# Patient Record
Sex: Male | Born: 1955 | Race: White | Hispanic: Refuse to answer | Marital: Married | Smoking: Never smoker
Health system: Southern US, Community
[De-identification: ages and names within clinical notes are randomized; demographics above are authoritative.]

## PROBLEM LIST (undated history)

## (undated) DIAGNOSIS — E785 Hyperlipidemia, unspecified: Secondary | ICD-10-CM

## (undated) DIAGNOSIS — E119 Type 2 diabetes mellitus without complications: Secondary | ICD-10-CM

## (undated) DIAGNOSIS — I2510 Atherosclerotic heart disease of native coronary artery without angina pectoris: Secondary | ICD-10-CM

## (undated) DIAGNOSIS — I219 Acute myocardial infarction, unspecified: Secondary | ICD-10-CM

## (undated) HISTORY — DX: Acute myocardial infarction, unspecified: I21.9

## (undated) HISTORY — PX: CHOLECYSTECTOMY: SHX55

## (undated) HISTORY — DX: Atherosclerotic heart disease of native coronary artery without angina pectoris: I25.10

## (undated) HISTORY — PX: CARDIAC CATHETERIZATION: SHX172

## (undated) HISTORY — PX: HERNIA REPAIR: SHX51

## (undated) HISTORY — DX: Type 2 diabetes mellitus without complications: E11.9

## (undated) HISTORY — PX: MELANOMA EXCISION: SHX5266

---

## 2010-02-05 ENCOUNTER — Encounter: Payer: Self-pay | Admitting: Gastroenterology

## 2010-08-20 NOTE — Letter (Signed)
Summary: Previsit letter  [facility]  [address] [place], [place] [address]   Phone: [phone]  Fax: [phone]       02/05/2010 MRN: [id number]  [facility] [address] [place], [place]  [address]  Dear Mr. LEDO,  Welcome to the Gastroenterology Division at [facility].    You are scheduled to see a nurse for your pre-procedure visit on  02-27-10 at 2:30pm on the 3rd floor at [facility], [address]  We ask that you try to arrive at our office 15 minutes prior to your appointment time to allow for check-in.  Your nurse visit will consist of discussing your medical and surgical history, your immediate family medical history, and your medications.    Please bring a complete list of all your medications or, if you prefer, bring the medication bottles and we will list them.  We will need to be aware of both prescribed and over the counter drugs.  We will need to know exact dosage information as well.  If you are on blood thinners (Coumadin, Plavix, Aggrenox, Ticlid, etc.) please call our office today/prior to your appointment, as we need to consult with your physician about holding your medication.   Please be prepared to read and sign documents such as consent forms, a financial agreement, and acknowledgement forms.  If necessary, and with your consent, a friend or relative is welcome to sit-in on the nurse visit with you.  Please bring your insurance card so that we may make a copy of it.  If your insurance requires a referral to see a specialist, please bring your referral form from your primary care physician.  No co-pay is required for this nurse visit.     If you cannot keep your appointment, please call [phone] to cancel or reschedule prior to your appointment date.  This allows [place] the opportunity to schedule an appointment for another patient in need of care.    Thank you for choosing [facility] for your medical  needs.  We appreciate the opportunity to care for you.  Please visit [place] at our website  to learn more about our practice.                     Sincerely.                                                                                                                   The Gastroenterology Division

## 2011-09-19 ENCOUNTER — Emergency Department (HOSPITAL_COMMUNITY): Payer: BC Managed Care – PPO

## 2011-09-19 ENCOUNTER — Other Ambulatory Visit: Payer: Self-pay

## 2011-09-19 ENCOUNTER — Encounter (HOSPITAL_COMMUNITY): Payer: Self-pay | Admitting: Physical Medicine and Rehabilitation

## 2011-09-19 ENCOUNTER — Emergency Department (HOSPITAL_COMMUNITY)
Admission: EM | Admit: 2011-09-19 | Discharge: 2011-09-19 | Disposition: A | Discharge status: Home / Self Care | Payer: BC Managed Care – PPO | Attending: Emergency Medicine | Admitting: Emergency Medicine

## 2011-09-19 DIAGNOSIS — Z7982 Long term (current) use of aspirin: Secondary | ICD-10-CM | POA: Insufficient documentation

## 2011-09-19 DIAGNOSIS — Y9352 Activity, horseback riding: Secondary | ICD-10-CM | POA: Insufficient documentation

## 2011-09-19 DIAGNOSIS — M79609 Pain in unspecified limb: Secondary | ICD-10-CM | POA: Insufficient documentation

## 2011-09-19 DIAGNOSIS — S060XAA Concussion with loss of consciousness status unknown, initial encounter: Secondary | ICD-10-CM | POA: Insufficient documentation

## 2011-09-19 DIAGNOSIS — M25519 Pain in unspecified shoulder: Secondary | ICD-10-CM

## 2011-09-19 DIAGNOSIS — IMO0002 Reserved for concepts with insufficient information to code with codable children: Secondary | ICD-10-CM | POA: Insufficient documentation

## 2011-09-19 DIAGNOSIS — S060X9A Concussion with loss of consciousness of unspecified duration, initial encounter: Secondary | ICD-10-CM | POA: Insufficient documentation

## 2011-09-19 DIAGNOSIS — R079 Chest pain, unspecified: Secondary | ICD-10-CM | POA: Insufficient documentation

## 2011-09-19 DIAGNOSIS — M542 Cervicalgia: Secondary | ICD-10-CM | POA: Insufficient documentation

## 2011-09-19 LAB — DIFFERENTIAL
Eosinophils Absolute: 0 10*3/uL (ref 0.0–0.7)
Eosinophils Relative: 0 % (ref 0–5)
Lymphocytes Relative: 7 % — ABNORMAL LOW (ref 12–46)
Lymphs Abs: 1.1 10*3/uL (ref 0.7–4.0)
Monocytes Relative: 5 % (ref 3–12)
Neutrophils Relative %: 88 % — ABNORMAL HIGH (ref 43–77)

## 2011-09-19 LAB — CBC
Hemoglobin: 15.7 g/dL (ref 13.0–17.0)
MCH: 29.1 pg (ref 26.0–34.0)
MCV: 82.2 fL (ref 78.0–100.0)
Platelets: 207 10*3/uL (ref 150–400)
RBC: 5.39 MIL/uL (ref 4.22–5.81)
WBC: 16.9 10*3/uL — ABNORMAL HIGH (ref 4.0–10.5)

## 2011-09-19 LAB — COMPREHENSIVE METABOLIC PANEL
ALT: 36 U/L (ref 0–53)
Alkaline Phosphatase: 58 U/L (ref 39–117)
BUN: 19 mg/dL (ref 6–23)
CO2: 22 mEq/L (ref 19–32)
GFR calc Af Amer: >90 mL/min (ref >90)
GFR calc non Af Amer: 79 mL/min — ABNORMAL LOW (ref >90)
Glucose, Bld: 111 mg/dL — ABNORMAL HIGH (ref 70–99)
Potassium: 4.5 mEq/L (ref 3.5–5.1)
Sodium: 137 mEq/L (ref 135–145)

## 2011-09-19 MED ORDER — ONDANSETRON 4 MG PO TBDP
ORAL_TABLET | ORAL | Status: AC
  Administered 2011-09-19: 8 mg
  Filled 2011-09-19: qty 2

## 2011-09-19 MED ORDER — OXYCODONE-ACETAMINOPHEN 5-325 MG PO TABS
ORAL_TABLET | ORAL | Status: AC
  Filled 2011-09-19: qty 1

## 2011-09-19 MED ORDER — OXYCODONE-ACETAMINOPHEN 5-325 MG PO TABS
2.0000 | ORAL_TABLET | Freq: Once | ORAL | Status: AC
  Administered 2011-09-19: 2 via ORAL
  Filled 2011-09-19: qty 1

## 2011-09-19 MED ORDER — ONDANSETRON 4 MG PO TBDP: 4.0000 mg | ORAL_TABLET | Freq: Once | ORAL | Status: DC

## 2011-09-19 MED ORDER — SODIUM CHLORIDE 0.9 % IV BOLUS (SEPSIS)
1000.0000 mL | Freq: Once | INTRAVENOUS | Status: AC
  Administered 2011-09-19: 1000 mL via INTRAVENOUS

## 2011-09-19 MED ORDER — HYDROCODONE-ACETAMINOPHEN 5-325 MG PO TABS: 2.0000 | ORAL_TABLET | Freq: Four times a day (QID) | ORAL | Status: AC | PRN

## 2011-09-19 NOTE — ED Notes (Signed)
Pt presents to department via [facility] for evaluation of fall off of horse. Pt unable to rememver exact events, upon arrival GCS of 15. C/o L shoulder pain, no deformity noted. LSB and c-collar per EMS. Received fentanyl per EMS.

## 2011-09-19 NOTE — Discharge Instructions (Signed)
Please make sure to return to the emergency department for any concerning changes your condition.  Specifically, any behavioral changes, any chest pain or difficulty breathing.

## 2011-09-19 NOTE — ED Provider Notes (Signed)
History     CSN: [id number]  Arrival date & time 09/19/11  1139   First MD Initiated Contact with Patient 09/19/11 1156      Chief Complaint  Patient presents with  . Fall     HPI The patient presents as a level II trauma following a probable fall from a horse.  The patient is amnestic to the events prior to EMS arrival.  Per report the patient was seen riding his horse, then was found on the ground with a cracked helmet.  Per EMS, upon their arrival the patient was unsteady, but ambulatory, speaking spontaneously.  The patient notes pain in his left shoulder, right hamstring.  The pain is present since he awakened.  No attempts at analgesia thus far.  He denies any confusion, disorientation, nausea.  He also denies any loss of bowel or bladder control.  He denies any distal dysesthesia in his extremities. No past medical history on file.  No past surgical history on file.  History reviewed. No pertinent family history.  History  Substance Use Topics  . Smoking status: Not on file  . Smokeless tobacco: Not on file  . Alcohol Use: No      Review of Systems  Constitutional:       Per HPI, otherwise negative  HENT:       Per HPI, otherwise negative  Eyes: Negative.   Respiratory:       Per HPI, otherwise negative  Cardiovascular:       Per HPI, otherwise negative  Gastrointestinal: Negative for vomiting.  Genitourinary: Negative.   Musculoskeletal:       Per HPI, otherwise negative  Skin: Negative.   Neurological: Negative for syncope.    Allergies  Penicillins  Home Medications   Current Outpatient Rx  Name Route Sig Dispense Refill  . ASPIRIN EC 81 MG PO TBEC Oral Take 81 mg by mouth daily.    . ADULT MULTIVITAMIN W/MINERALS CH Oral Take 1 tablet by mouth daily.      BP 121/74  Pulse 87  Temp(Src) 98 F (36.7 C) (Oral)  Resp 18  SpO2 99%  Physical Exam  Nursing note and vitals reviewed. Constitutional: He appears distressed.       No apparent  distress, male presenting boarded and collared via EMS.  HENT:  Head: Normocephalic. Head is with abrasion. Head is without raccoon's eyes, without Battle's sign, without contusion, without laceration, without right periorbital erythema and without left periorbital erythema.  Mouth/Throat: Oropharynx is clear and moist and mucous membranes are normal. Normal dentition.       Superficial abrasion on the patient's left posterior ear, approximately 1 cm.  Eyes: Conjunctivae and EOM are normal. Pupils are equal, round, and reactive to light. Right eye exhibits no discharge. Left eye exhibits no discharge.  Neck: Normal range of motion. Muscular tenderness present. No spinous process tenderness present. No rigidity. No edema present.  Cardiovascular: Normal rate and regular rhythm.   Pulmonary/Chest: Effort normal and breath sounds normal. No stridor. No respiratory distress. He has no wheezes. He has no rales. He exhibits tenderness.  Abdominal: Soft. He exhibits no distension.  Musculoskeletal:       Right knee: Normal.       Right ankle: Normal.       Left ankle: Normal.       Arms:      Legs:      Hips stable    ED Course  Procedures (including critical care time)  Labs Reviewed  CBC - Abnormal; Notable for the following:    WBC 16.9 (*)    All other components within normal limits  DIFFERENTIAL - Abnormal; Notable for the following:    Neutrophils Relative 88 (*)    Neutro Abs 14.9 (*)    Lymphocytes Relative 7 (*)    All other components within normal limits  COMPREHENSIVE METABOLIC PANEL   No results found.   No diagnosis found.  Cardiac monitor 90 sinus rhythm normal Pulse oximetry 100% room air normal I have reviewed both the CT and x-rays  Date: 09/19/2011  Rate: 83  Rhythm: normal sinus rhythm  QRS Axis: normal  Intervals: normal  ST/T Wave abnormalities: normal  Conduction Disutrbances:none  Narrative Interpretation:   Old EKG Reviewed: none available NORMAL  ECG   MDM  This generally well male presents following a fall from a horse.  On initial exam the patient is boarded and collared.  The patient's cervical spine was cleared clinically.  Throughout his ED stay the patient had no significant changes in his cognition.  The patient's initial exam is notable for mild shoulder tenderness on the left.  This was evaluated with x-ray which is negative.  The patient's head CT was also unremarkable.  The patient's remaining renographic studies were reassuring as well.  Patient noted significant improvement in his pain when was here, though upon receiving pain medication just prior to discharge, he was nauseous.  This was observed and when less nauseous was walking without difficulty.  He was discharged in stable condition.        Gerhard Munch, MD 09/19/11 [phone]

## 2011-09-19 NOTE — ED Notes (Signed)
Pt states he feels much better. Able to ambulate around department without becoming dizzy or nauseated. To be discharged home, family at bedside. Vital signs stable.

## 2011-09-19 NOTE — ED Notes (Signed)
Pt presents to department for evaluation of fall off horse. Pt states he remembers getting on horse this morning, then states he woke up in a field. States "I can't remember everything that happened." upon arrival GCS of 15, no obvious deformities noted. 8/10 L shoulder pain. Pt is conscious alert and oriented x4 at the time, answering questions appropriately, no neurological deficits noted. Vital signs stable at the present. No signs of acute distress. Family at the bedside at the time.

## 2011-09-19 NOTE — ED Notes (Signed)
Pt resting quietly at the time. States 5/10 L shoulder pain. Vital signs stable. Wife at the bedside. Pt remains alert and oriented x4, answering questions appropriately, but wife states he is "repeating" things intermittently.

## 2011-09-19 NOTE — ED Notes (Signed)
Pt attempted to get dressed for discharge, became nauseated and weak. EDP notified. Pt medicated, will continue to monitor. Vital signs stable at the time.

## 2011-09-19 NOTE — Progress Notes (Signed)
Chaplain responded to a trauma page. Chaplain provided support to patient's wife. Patient's wife stated that she is coping okay. Chaplain no longer needed at this time. Chaplain let patient's wife know that he could be paged by medical staff if needed further.

## 2012-07-06 ENCOUNTER — Other Ambulatory Visit (HOSPITAL_COMMUNITY): Payer: Self-pay | Admitting: Cardiovascular Disease

## 2012-07-06 DIAGNOSIS — I6529 Occlusion and stenosis of unspecified carotid artery: Secondary | ICD-10-CM

## 2012-09-09 IMAGING — CR DG SHOULDER 2+V*L*
3 series · 3 of 3 positions shown · non-contrast
Comparison: None.

CLINICAL DATA: Fall off horse.  Left shoulder injury and pain.

LEFT SHOULDER - 2+ VIEW

[t shoulder ap internal left]
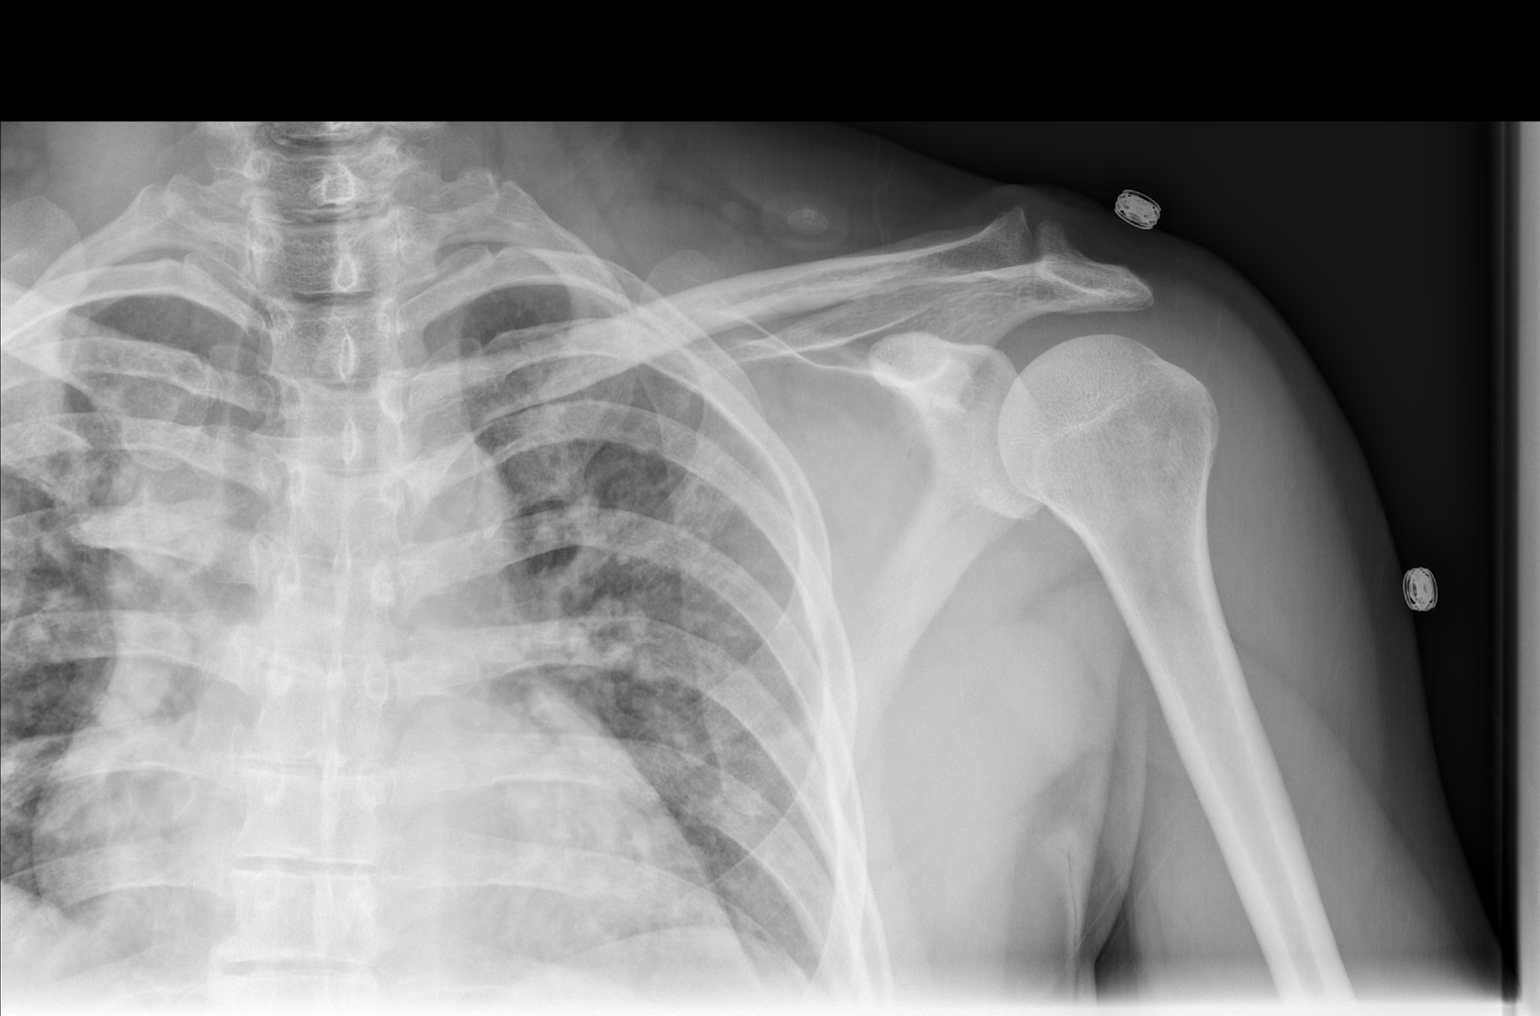

[t shoulder ap external left]
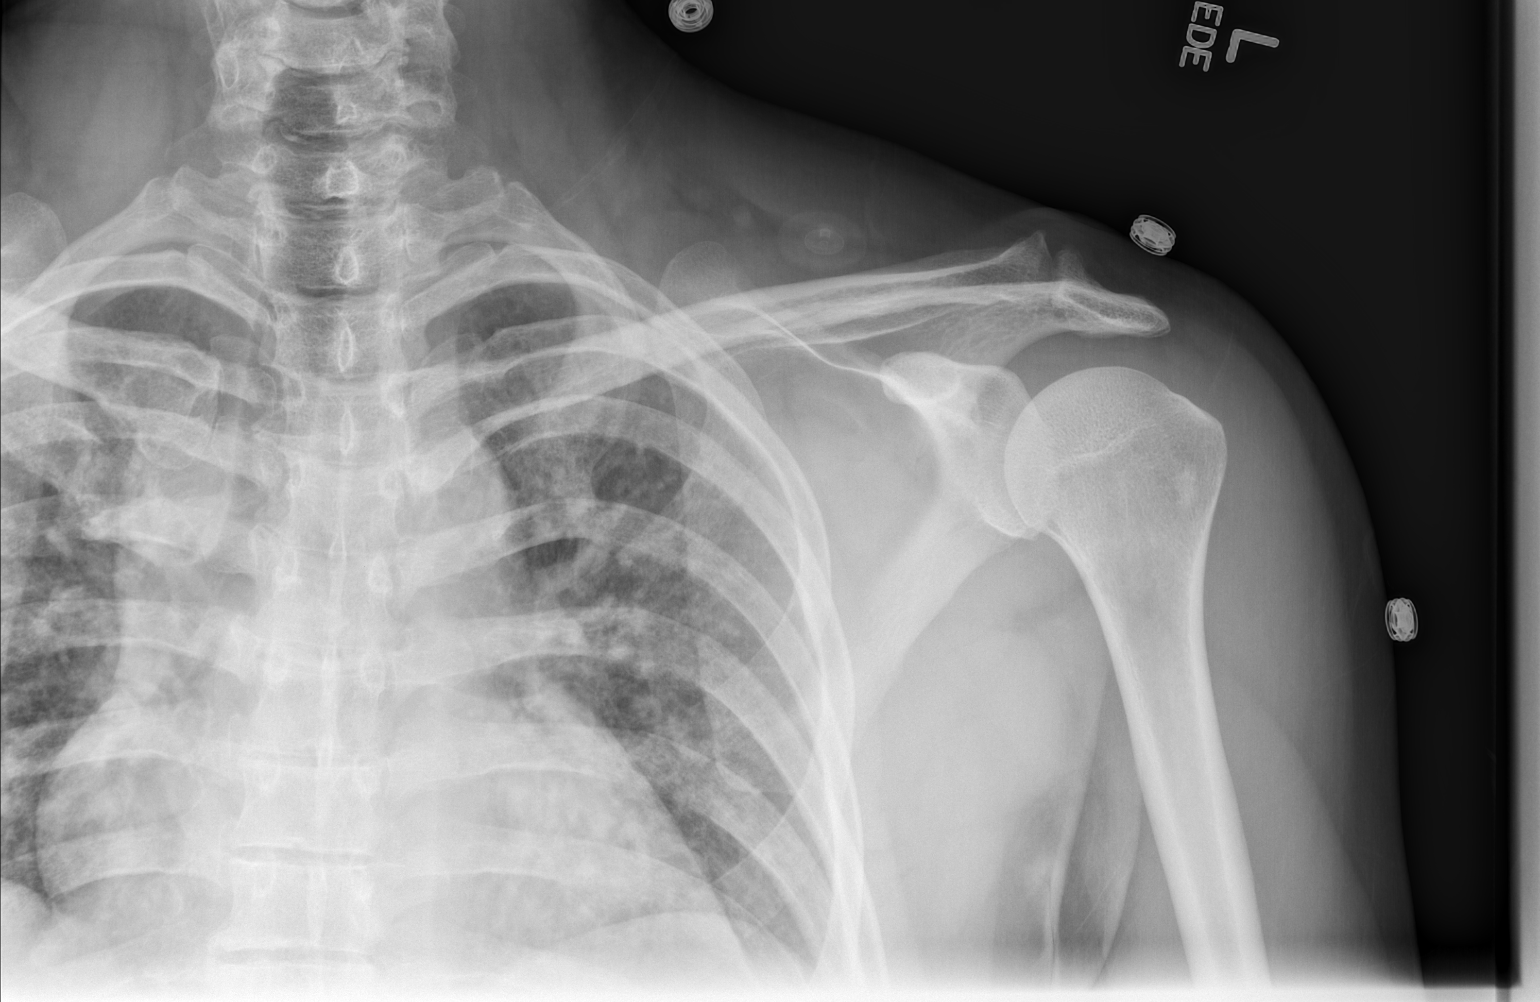

[t shoulder y view left]
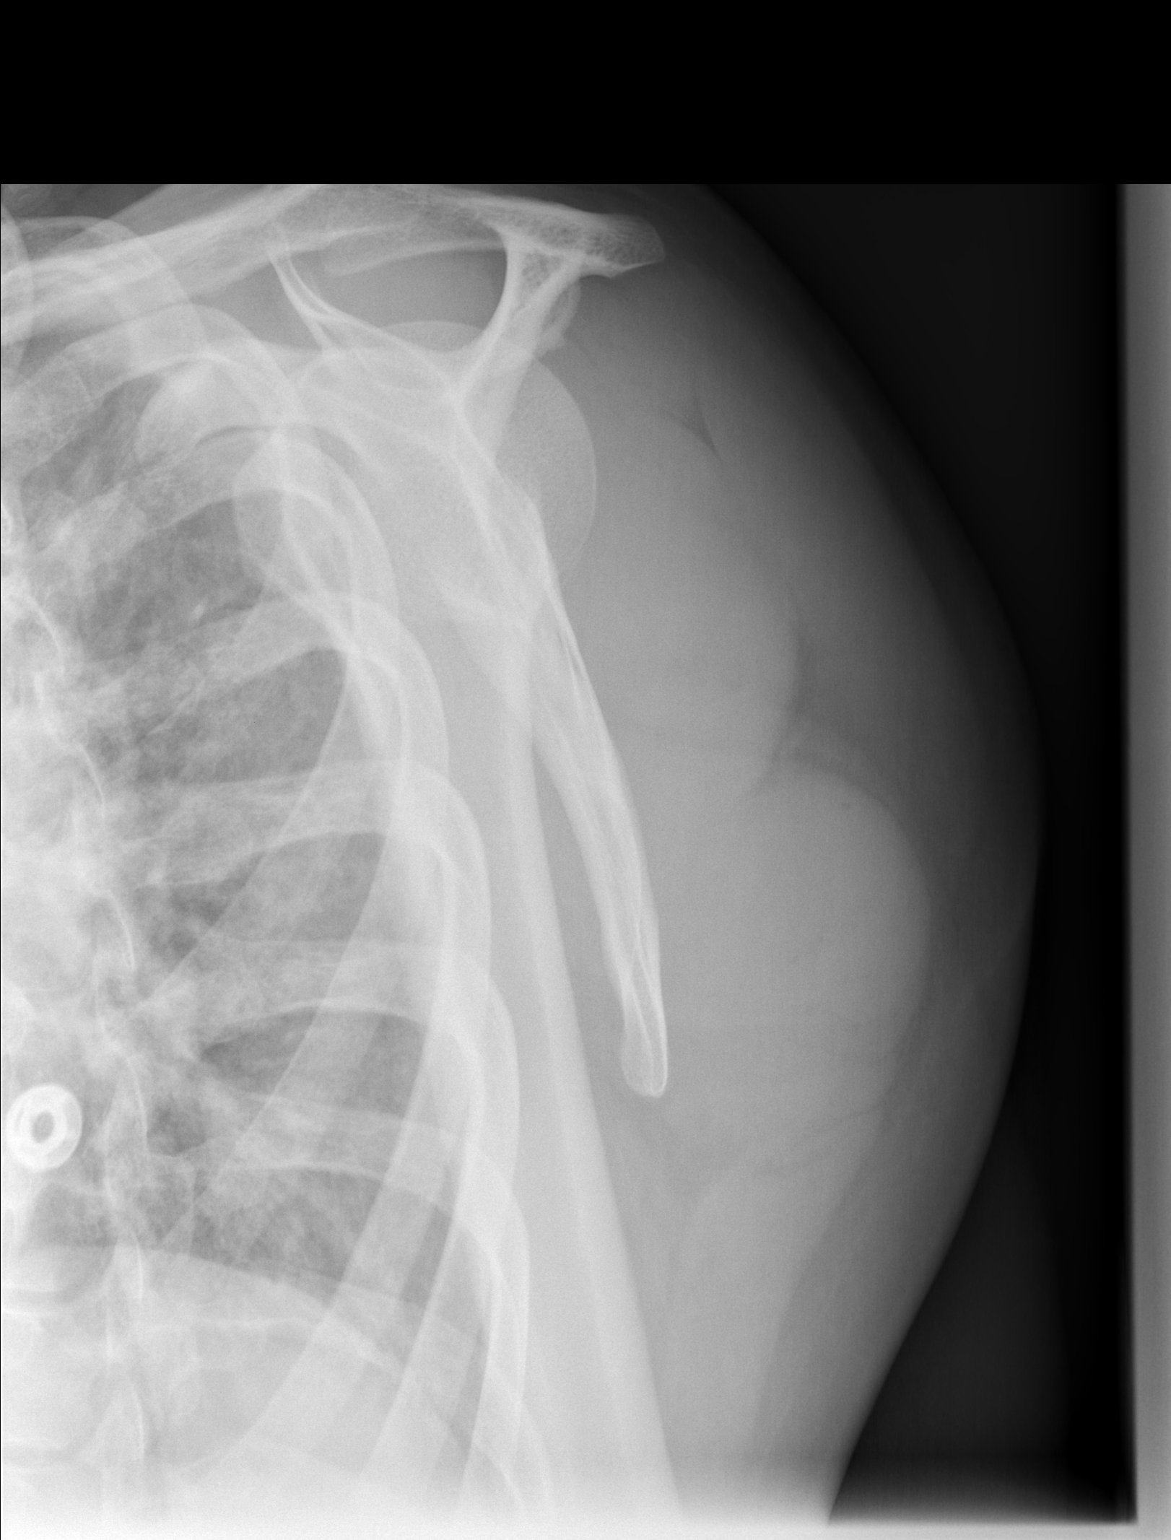

[3 of 3 positions shown; findings below may reference images not displayed]

FINDINGS: No evidence of fracture or dislocation. Mild degenerative
spurring is seen involving the superior aspect of the AC joint.  No
other bone abnormality identified.
IMPRESSION: No acute findings.

## 2012-09-09 IMAGING — CR DG PELVIS 1-2V
1 series · 1 of 1 positions shown · non-contrast
Comparison: None.

CLINICAL DATA: Fall off horse.  Pelvic injury and pain.

PELVIS - 1-2 VIEW

[t pelvis a.p.]
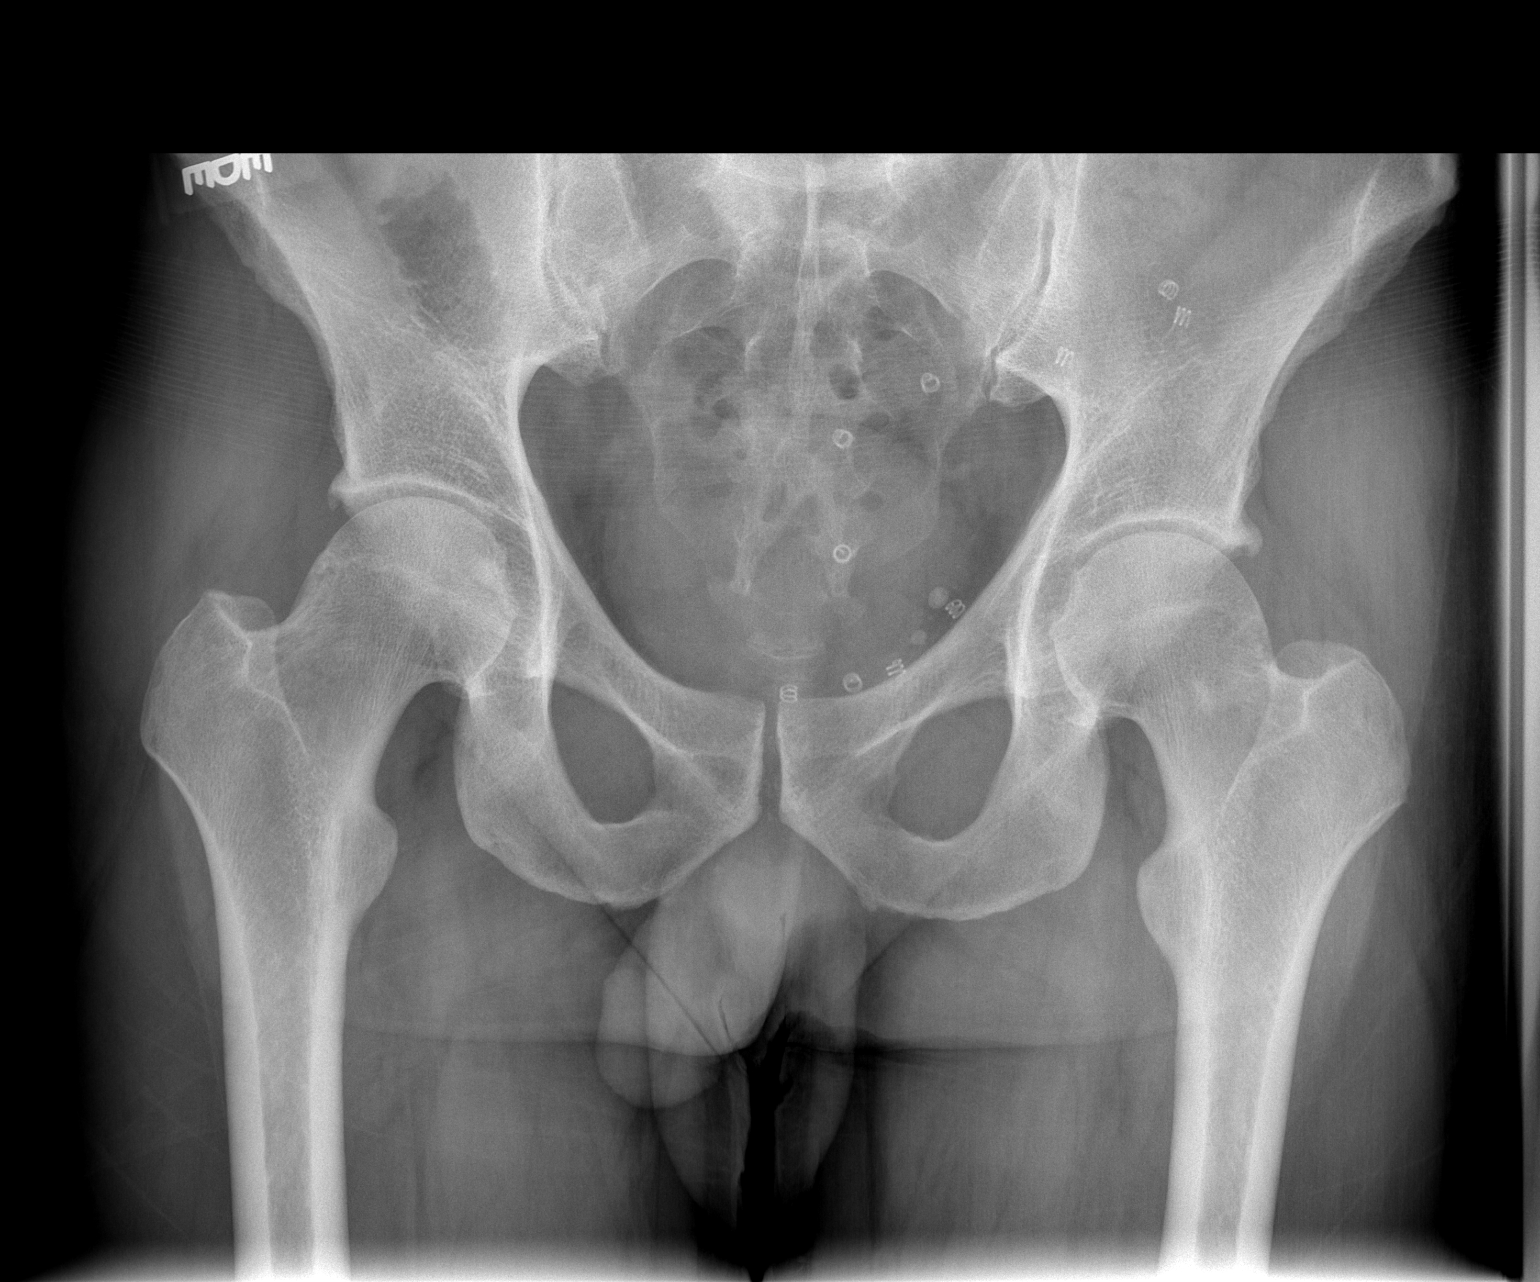

[1 of 1 positions shown; findings below may reference images not displayed]

FINDINGS: No evidence of pelvic fracture or pelvic joint diastasis.
No other pelvic bone abnormality identified.  Surgical mesh is seen
in the left inguinal region.
IMPRESSION: No evidence of pelvic fracture.

## 2012-09-09 IMAGING — CT CT HEAD W/O CM
1 series · 16 of 30 positions shown, 20 images · non-contrast
Comparison: None.

CLINICAL DATA: Fall from horse

CT HEAD WITHOUT CONTRAST
TECHNIQUE: Contiguous axial images were obtained from the base of
the skull through the vertex without contrast.

[Series 2: head trauma 4.8 h37s · axial · 0.51mm/px · z∈[-128,+32]mm · 16 of 36 slices shown, 20 images]
[im 2/36  brain]
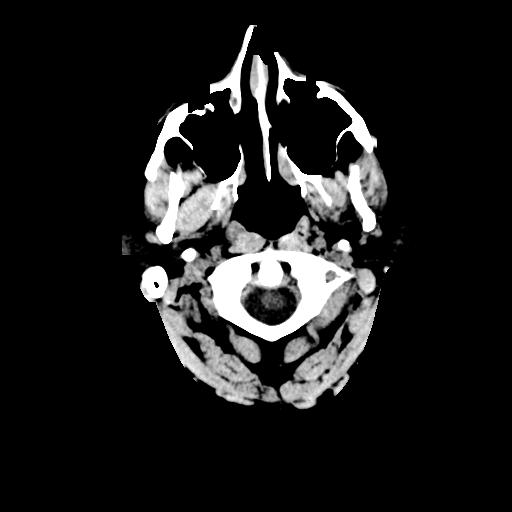
[im 2/36  bone]
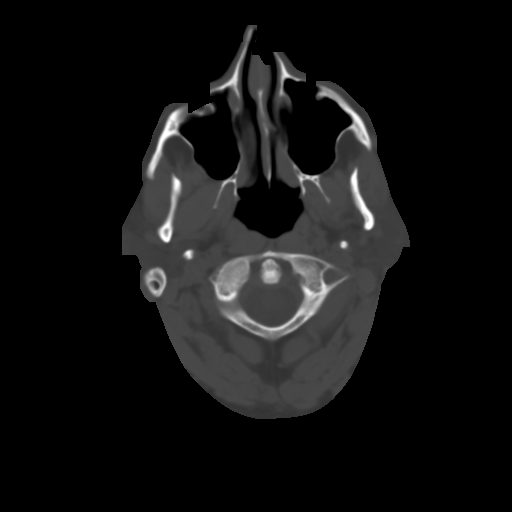
[im 4/36  brain]
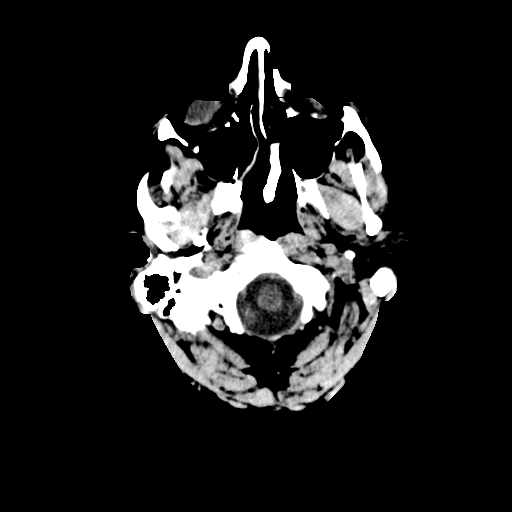
[im 7/36  brain]
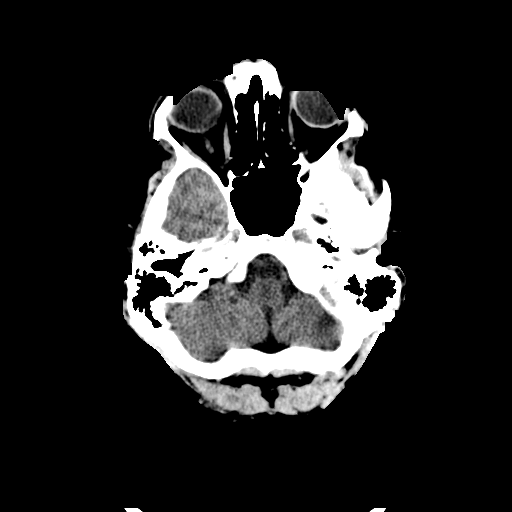
[im 9/36  brain]
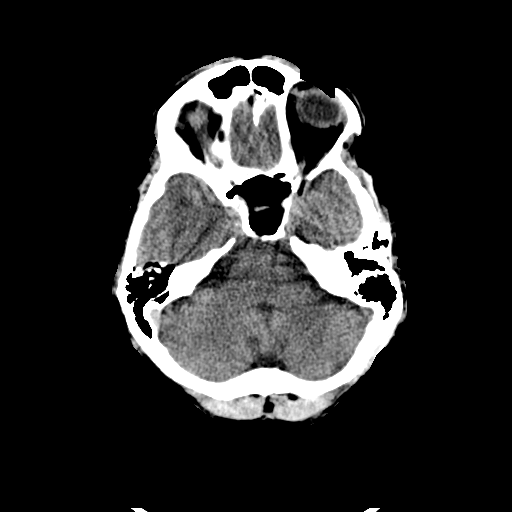
[im 10/36  brain]
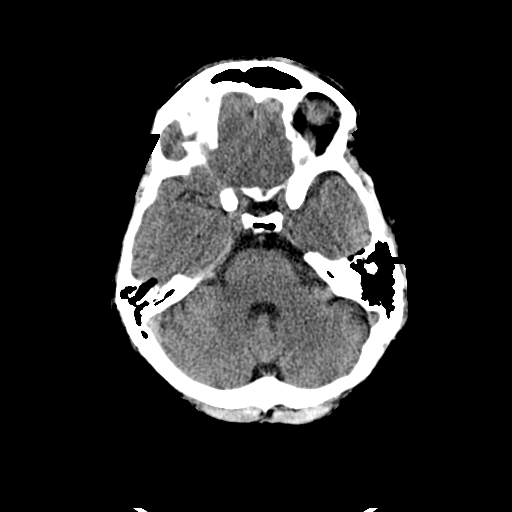
[im 10/36  bone]
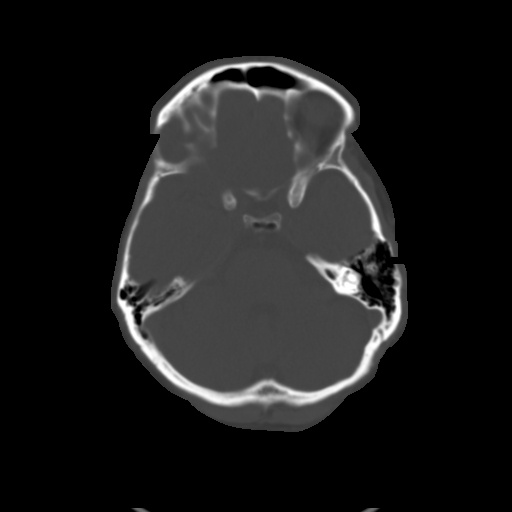
[im 13/36  brain]
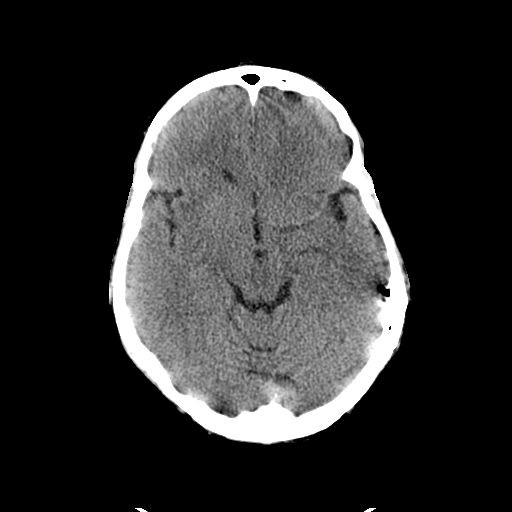
[im 15/36  brain]
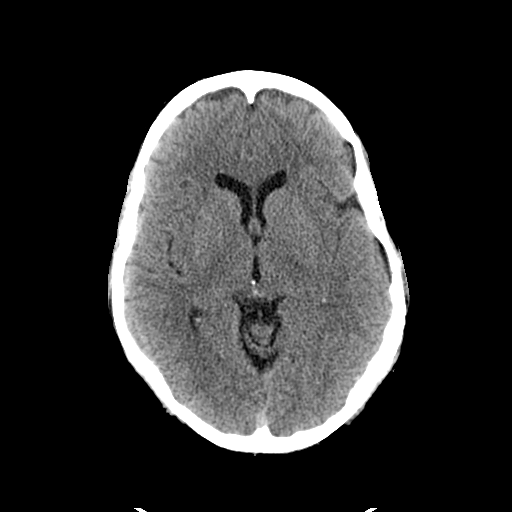
[im 17/36  brain]
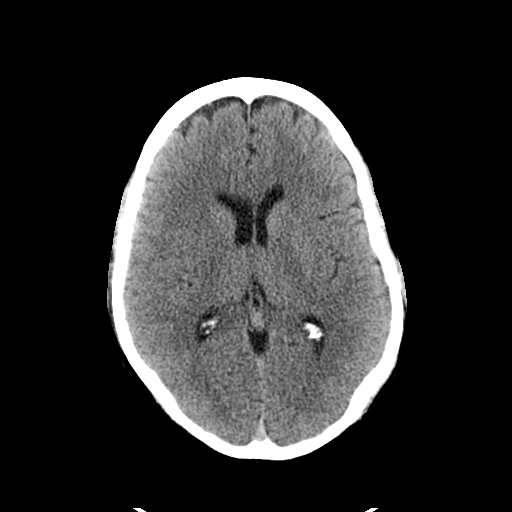
[im 19/36  brain]
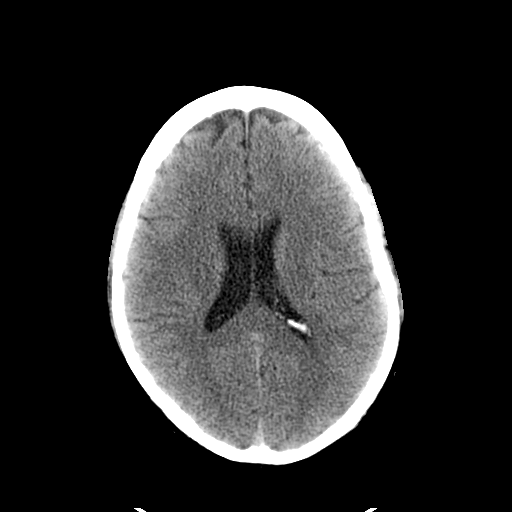
[im 19/36  bone]
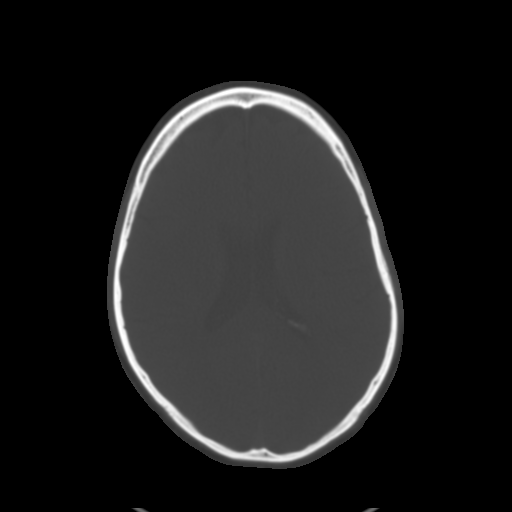
[im 21/36  brain]
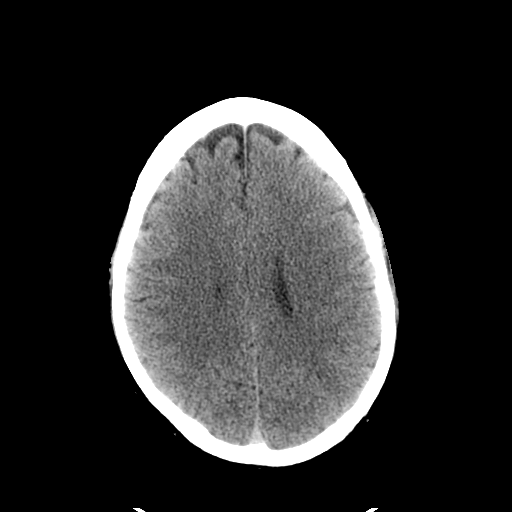
[im 23/36  brain]
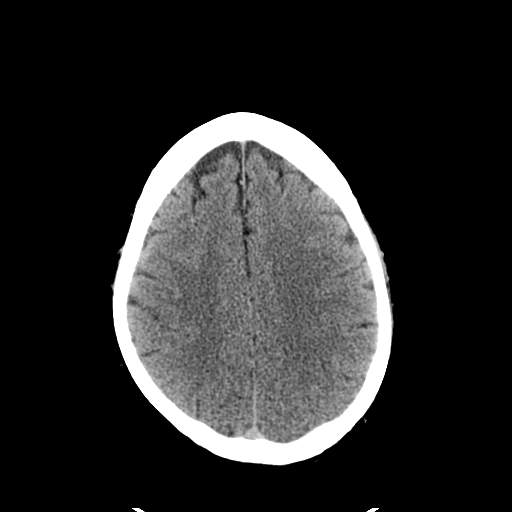
[im 26/36  brain]
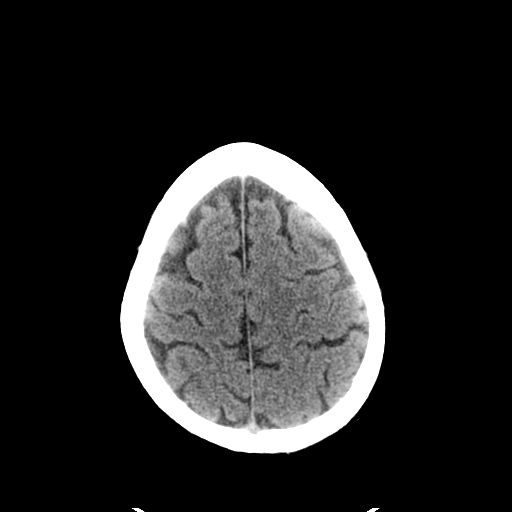
[im 27/36  brain]
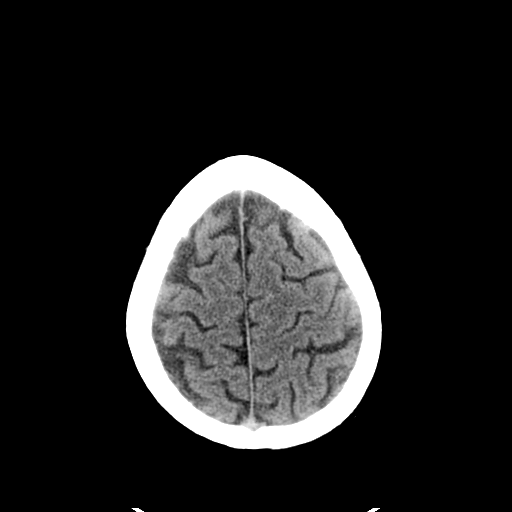
[im 27/36  bone]
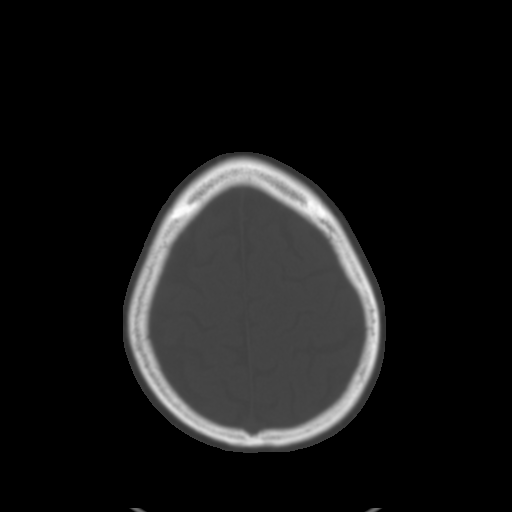
[im 29/36  brain]
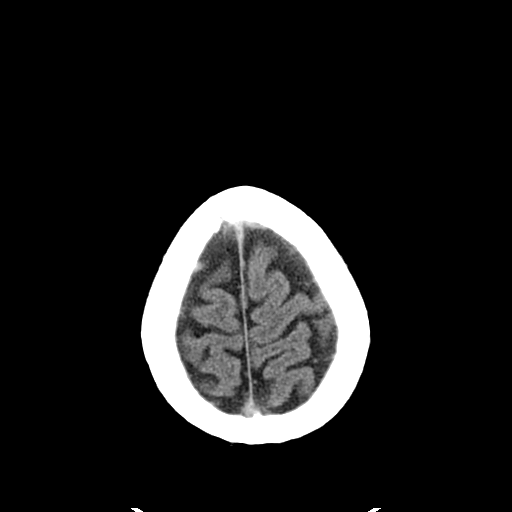
[im 32/36  brain]
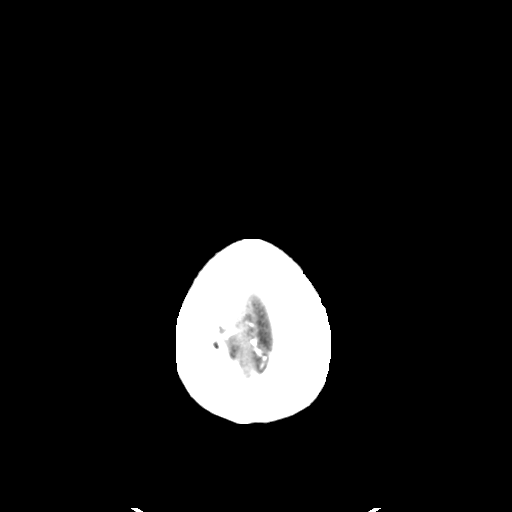
[im 34/36  brain]
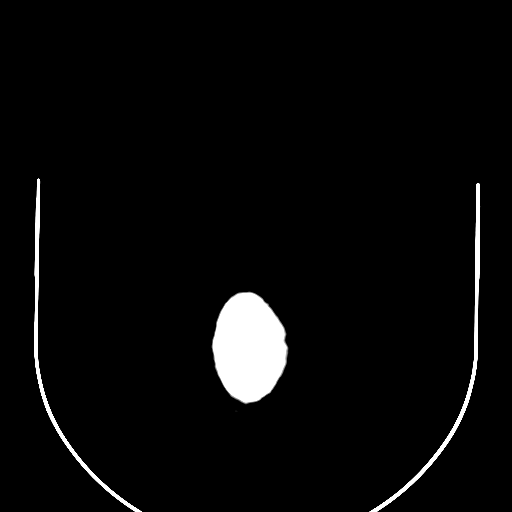

[16 of 30 positions shown; findings below may reference images not displayed]

FINDINGS: No intracranial hemorrhage.  No parenchymal contusion.
No midline shift or mass effect.  Basilar cisterns are patent. No
skull base fracture.  No fluid in the paranasal sinuses or mastoid
air cells.
IMPRESSION: No intracranial trauma

## 2013-04-21 ENCOUNTER — Ambulatory Visit (INDEPENDENT_AMBULATORY_CARE_PROVIDER_SITE_OTHER): Payer: BC Managed Care – PPO | Admitting: *Deleted

## 2013-04-21 VITALS — Temp 98.5°F

## 2013-04-21 DIAGNOSIS — Z23 Encounter for immunization: Secondary | ICD-10-CM

## 2013-04-28 ENCOUNTER — Encounter (HOSPITAL_COMMUNITY): Payer: BC Managed Care – PPO

## 2014-07-25 ENCOUNTER — Ambulatory Visit (INDEPENDENT_AMBULATORY_CARE_PROVIDER_SITE_OTHER): Payer: BLUE CROSS/BLUE SHIELD | Admitting: *Deleted

## 2014-07-25 DIAGNOSIS — Z23 Encounter for immunization: Secondary | ICD-10-CM

## 2015-12-20 DIAGNOSIS — C4492 Squamous cell carcinoma of skin, unspecified: Secondary | ICD-10-CM

## 2015-12-20 HISTORY — DX: Squamous cell carcinoma of skin, unspecified: C44.92

## 2016-05-15 DIAGNOSIS — M79645 Pain in left finger(s): Secondary | ICD-10-CM | POA: Insufficient documentation

## 2016-06-02 DIAGNOSIS — M20012 Mallet finger of left finger(s): Secondary | ICD-10-CM | POA: Insufficient documentation

## 2016-06-30 DIAGNOSIS — C4492 Squamous cell carcinoma of skin, unspecified: Secondary | ICD-10-CM

## 2016-06-30 HISTORY — DX: Squamous cell carcinoma of skin, unspecified: C44.92

## 2016-10-02 ENCOUNTER — Ambulatory Visit (INDEPENDENT_AMBULATORY_CARE_PROVIDER_SITE_OTHER): Payer: BC Managed Care – PPO | Admitting: Physician Assistant

## 2016-10-02 ENCOUNTER — Encounter: Payer: Self-pay | Admitting: Physician Assistant

## 2016-10-02 VITALS — BP 118/82 | HR 67 | Temp 98.0°F | Resp 16 | Wt 198.6 lb

## 2016-10-02 DIAGNOSIS — J988 Other specified respiratory disorders: Secondary | ICD-10-CM | POA: Diagnosis not present

## 2016-10-02 DIAGNOSIS — B9689 Other specified bacterial agents as the cause of diseases classified elsewhere: Principal | ICD-10-CM

## 2016-10-02 MED ORDER — AZITHROMYCIN 250 MG PO TABS: ORAL_TABLET | ORAL | 0 refills | Status: DC

## 2016-10-02 NOTE — Progress Notes (Signed)
    Patient ID: Dustin Wood MRN: [id number], DOB: [birth year], 61 y.o. Date of Encounter: 10/02/2016, 4:20 PM    Chief Complaint:  Chief Complaint  Patient presents with  . sinus problems     HPI: 61 y.o. year old male presents with above.   Says his "head has been full of snot for a couple of weeks. " Had very minimal sinus pain/pressure. His had no sore throat. Has some drainage down his throat but does not feel like there is any chest congestion. Has had no fevers or chills.     Home Meds:   Outpatient Medications Prior to Visit  Medication Sig Dispense Refill  . Multiple Vitamin (MULITIVITAMIN WITH MINERALS) TABS Take 1 tablet by mouth daily.    Marland Kitchen aspirin EC 81 MG tablet Take 81 mg by mouth daily.     No facility-administered medications prior to visit.     Allergies:  Allergies  Allergen Reactions  . Penicillins Other (See Comments)    Childhood allergy      Review of Systems: See HPI for pertinent ROS. All other ROS negative.    Physical Exam: Blood pressure 118/82, pulse 67, temperature 98 F (36.7 C), temperature source Oral, resp. rate 16, weight 198 lb 9.6 oz (90.1 kg), SpO2 97 %., There is no height or weight on file to calculate BMI. General:  WNWD WM. Appears in no acute distress. HEENT: Normocephalic, atraumatic, eyes without discharge, sclera non-icteric, nares are without discharge. Bilateral auditory canals clear, TM's are without perforation, pearly grey and translucent with reflective cone of light bilaterally. Oral cavity moist, posterior pharynx without exudate, erythema, peritonsillar abscess. No tenderness with percussion to frontal or maxillary sinuses bilaterally.  Neck: Supple. No thyromegaly. No lymphadenopathy. Lungs: Clear bilaterally to auscultation without wheezes, rales, or rhonchi. Breathing is unlabored. Heart: Regular rhythm. No murmurs, rubs, or gallops. Msk:  Strength and tone normal for age. Extremities/Skin: Warm and dry.    Neuro: Alert and oriented X 3. Moves all extremities spontaneously. Gait is normal. CNII-XII grossly in tact. Psych:  Responds to questions appropriately with a normal affect.     ASSESSMENT AND PLAN:  61 y.o. year old male with  1. Bacterial respiratory infection He has penicillin allergy. To take the azithromycin as directed. Follow-up if symptoms do not resolve after completion of this. Can use over-the-counter decongestants as needed for central relief in the interim as well. - azithromycin (ZITHROMAX) 250 MG tablet; Day 1: Take 2 daily. Days 2 - 5: Take 1 daily.  Dispense: 6 tablet; Refill: 0   Signed, [address] [place], [place], [facility] 10/02/2016 4:20 PM

## 2017-08-05 ENCOUNTER — Other Ambulatory Visit: Payer: Self-pay | Admitting: Family Medicine

## 2017-08-05 DIAGNOSIS — Z0000 Encounter for general adult medical examination without abnormal findings: Secondary | ICD-10-CM

## 2017-08-05 DIAGNOSIS — Z125 Encounter for screening for malignant neoplasm of prostate: Secondary | ICD-10-CM

## 2017-08-06 ENCOUNTER — Other Ambulatory Visit: Payer: BC Managed Care – PPO

## 2017-08-06 DIAGNOSIS — Z0000 Encounter for general adult medical examination without abnormal findings: Secondary | ICD-10-CM

## 2017-08-06 DIAGNOSIS — Z125 Encounter for screening for malignant neoplasm of prostate: Secondary | ICD-10-CM

## 2017-08-06 LAB — CBC WITH DIFFERENTIAL/PLATELET
BASOS PCT: 0.4 %
Basophils Absolute: 30 cells/uL (ref 0–200)
EOS PCT: 3.8 %
Eosinophils Absolute: 289 cells/uL (ref 15–500)
HCT: 44.6 % (ref 38.5–50.0)
Hemoglobin: 15.2 g/dL (ref 13.2–17.1)
LYMPHS ABS: 1581 {cells}/uL (ref 850–3900)
MCH: 27.7 pg (ref 27.0–33.0)
MCHC: 34.1 g/dL (ref 32.0–36.0)
MCV: 81.4 fL (ref 80.0–100.0)
MPV: 9.4 fL (ref 7.5–12.5)
Monocytes Relative: 8.5 %
NEUTROS PCT: 66.5 %
Neutro Abs: 5054 cells/uL (ref 1500–7800)
Platelets: 260 10*3/uL (ref 140–400)
RBC: 5.48 10*6/uL (ref 4.20–5.80)
RDW: 13.5 % (ref 11.0–15.0)
TOTAL LYMPHOCYTE: 20.8 %
WBC: 7.6 10*3/uL (ref 3.8–10.8)
WBCMIX: 646 {cells}/uL (ref 200–950)

## 2017-08-06 LAB — COMPREHENSIVE METABOLIC PANEL
AG Ratio: 1.3 (calc) (ref 1.0–2.5)
ALBUMIN MSPROF: 3.8 g/dL (ref 3.6–5.1)
ALT: 21 U/L (ref 9–46)
AST: 17 U/L (ref 10–35)
Alkaline phosphatase (APISO): 53 U/L (ref 40–115)
BUN: 15 mg/dL (ref 7–25)
CO2: 25 mmol/L (ref 20–32)
CREATININE: 1.04 mg/dL (ref 0.70–1.33)
Calcium: 9 mg/dL (ref 8.6–10.3)
Chloride: 106 mmol/L (ref 98–110)
GLOBULIN: 2.9 g/dL (ref 1.9–3.7)
Glucose, Bld: 108 mg/dL — ABNORMAL HIGH (ref 65–99)
POTASSIUM: 4.7 mmol/L (ref 3.5–5.3)
SODIUM: 139 mmol/L (ref 135–146)
TOTAL PROTEIN: 6.7 g/dL (ref 6.1–8.1)
Total Bilirubin: 0.4 mg/dL (ref 0.2–1.2)

## 2017-08-06 LAB — PSA: PSA: 0.9 ng/mL (ref <=4.0)

## 2017-08-06 LAB — LIPID PANEL
CHOL/HDL RATIO: 5.7 (calc) — AB (ref <5.0)
Cholesterol: 182 mg/dL (ref <200)
HDL: 32 mg/dL — ABNORMAL LOW (ref >40)
LDL CHOLESTEROL (CALC): 125 mg/dL — AB
NON-HDL CHOLESTEROL (CALC): 150 mg/dL — AB (ref <130)
Triglycerides: 135 mg/dL (ref <150)

## 2017-08-11 ENCOUNTER — Ambulatory Visit (INDEPENDENT_AMBULATORY_CARE_PROVIDER_SITE_OTHER): Payer: BC Managed Care – PPO | Admitting: Family Medicine

## 2017-08-11 VITALS — BP 108/70 | HR 70 | Temp 98.6°F | Resp 14 | Ht 69.0 in | Wt 191.0 lb

## 2017-08-11 DIAGNOSIS — R739 Hyperglycemia, unspecified: Secondary | ICD-10-CM | POA: Diagnosis not present

## 2017-08-11 DIAGNOSIS — Z0000 Encounter for general adult medical examination without abnormal findings: Secondary | ICD-10-CM | POA: Diagnosis not present

## 2017-08-11 DIAGNOSIS — E786 Lipoprotein deficiency: Secondary | ICD-10-CM

## 2017-08-11 NOTE — Progress Notes (Signed)
Subjective:    Patient ID: Dustin Wood, male    DOB: Mar 06, 1956, 62 y.o.   MRN: [id number]  HPI Patient is a 62 year old white male who presents today for complete physical exam. He has no specific medical concerns. His last colonoscopy he believes was performed 5 years ago and was clear of any polyps. He was told that he does not need a repeat colonoscopy for 10 years. He is due for prostate cancer screening with a PSA. Patient is due for the shingles vaccine. He is artery had his flu shot. His most recent lab work as listed below: Appointment on 08/06/2017  Component Date Value Ref Range Status  . WBC 08/06/2017 7.6  3.8 - 10.8 Thousand/uL Final  . RBC 08/06/2017 5.48  4.20 - 5.80 Million/uL Final  . Hemoglobin 08/06/2017 15.2  13.2 - 17.1 g/dL Final  . HCT 08/06/2017 44.6  38.5 - 50.0 % Final  . MCV 08/06/2017 81.4  80.0 - 100.0 fL Final  . MCH 08/06/2017 27.7  27.0 - 33.0 pg Final  . MCHC 08/06/2017 34.1  32.0 - 36.0 g/dL Final  . RDW 08/06/2017 13.5  11.0 - 15.0 % Final  . Platelets 08/06/2017 260  140 - 400 Thousand/uL Final  . MPV 08/06/2017 9.4  7.5 - 12.5 fL Final  . Neutro Abs 08/06/2017 5,054  1,500 - 7,800 cells/uL Final  . Lymphs Abs 08/06/2017 1,581  850 - 3,900 cells/uL Final  . WBC mixed population 08/06/2017 646  200 - 950 cells/uL Final  . Eosinophils Absolute 08/06/2017 289  15 - 500 cells/uL Final  . Basophils Absolute 08/06/2017 30  0 - 200 cells/uL Final  . Neutrophils Relative % 08/06/2017 66.5  % Final  . Total Lymphocyte 08/06/2017 20.8  % Final  . Monocytes Relative 08/06/2017 8.5  % Final  . Eosinophils Relative 08/06/2017 3.8  % Final  . Basophils Relative 08/06/2017 0.4  % Final  . Glucose, Bld 08/06/2017 108* 65 - 99 mg/dL Final   Comment: .            Fasting reference interval . For someone without known diabetes, a glucose value between 100 and 125 mg/dL is consistent with prediabetes and should be confirmed with a follow-up test. .   . BUN  08/06/2017 15  7 - 25 mg/dL Final  . Creat 08/06/2017 1.04  0.70 - 1.33 mg/dL Final   Comment: For patients >29 years of age, the reference limit for Creatinine is approximately 13% higher for people identified as African-American. .   Havery Moros Ratio 24/23/5361 NOT APPLICABLE  6 - 22 (calc) Final  . Sodium 08/06/2017 139  135 - 146 mmol/L Final  . Potassium 08/06/2017 4.7  3.5 - 5.3 mmol/L Final  . Chloride 08/06/2017 106  98 - 110 mmol/L Final  . CO2 08/06/2017 25  20 - 32 mmol/L Final  . Calcium 08/06/2017 9.0  8.6 - 10.3 mg/dL Final  . Total Protein 08/06/2017 6.7  6.1 - 8.1 g/dL Final  . Albumin 08/06/2017 3.8  3.6 - 5.1 g/dL Final  . Globulin 08/06/2017 2.9  1.9 - 3.7 g/dL (calc) Final  . AG Ratio 08/06/2017 1.3  1.0 - 2.5 (calc) Final  . Total Bilirubin 08/06/2017 0.4  0.2 - 1.2 mg/dL Final  . Alkaline phosphatase (APISO) 08/06/2017 53  40 - 115 U/L Final  . AST 08/06/2017 17  10 - 35 U/L Final  . ALT 08/06/2017 21  9 - 46 U/L Final  .  Cholesterol 08/06/2017 182  <200 mg/dL Final  . HDL 08/06/2017 32* >40 mg/dL Final  . Triglycerides 08/06/2017 135  <150 mg/dL Final  . LDL Cholesterol (Calc) 08/06/2017 125* mg/dL (calc) Final   Comment: Reference range: <100 . Desirable range <100 mg/dL for primary prevention;   <70 mg/dL for patients with CHD or diabetic patients  with > or = 2 CHD risk factors. Marland Kitchen LDL-C is now calculated using the Martin-Hopkins  calculation, which is a validated novel method providing  better accuracy than the Friedewald equation in the  estimation of LDL-C.  Cresenciano Genre et al. Annamaria Helling. 4270;623(76): 2061-2068  (http://education.[employer].com/faq/FAQ164)   . Total CHOL/HDL Ratio 08/06/2017 5.7* <5.0 (calc) Final  . Non-HDL Cholesterol (Calc) 08/06/2017 150* <130 mg/dL (calc) Final   Comment: For patients with diabetes plus 1 major ASCVD risk  factor, treating to a non-HDL-C goal of <100 mg/dL  (LDL-C of <70 mg/dL) is considered a  therapeutic  option.   Marland Kitchen PSA 08/06/2017 0.9  < OR = 4.0 ng/mL Final   Comment: The total PSA value from this assay system is  standardized against the WHO standard. The test  result will be approximately 20% lower when compared  to the equimolar-standardized total PSA (Beckman  Coulter). Comparison of serial PSA results should be  interpreted with this fact in mind. . This test was performed using the Siemens  chemiluminescent method. Values obtained from  different assay methods cannot be used interchangeably. PSA levels, regardless of value, should not be interpreted as absolute evidence of the presence or absence of disease.    No past medical history on file.  Denies surgeries  Allergies  Allergen Reactions  . Penicillins Other (See Comments)    Childhood allergy   Social History   Socioeconomic History  . Marital status: Single    Spouse name: Not on file  . Number of children: Not on file  . Years of education: Not on file  . Highest education level: Not on file  Social Needs  . Financial resource strain: Not on file  . Food insecurity - worry: Not on file  . Food insecurity - inability: Not on file  . Transportation needs - medical: Not on file  . Transportation needs - non-medical: Not on file  Occupational History  . Not on file  Tobacco Use  . Smoking status: Never Smoker  . Smokeless tobacco: Never Used  Substance and Sexual Activity  . Alcohol use: No  . Drug use: No  . Sexual activity: Not on file  Other Topics Concern  . Not on file  Social History Narrative  . Not on file      Review of Systems  All other systems reviewed and are negative.      Objective:   Physical Exam  Constitutional: He is oriented to person, place, and time. He appears well-developed and well-nourished. No distress.  HENT:  Head: Normocephalic and atraumatic.  Right Ear: External ear normal.  Left Ear: External ear normal.  Nose: Nose normal.  Mouth/Throat:  Oropharynx is clear and moist. No oropharyngeal exudate.  Eyes: Conjunctivae and EOM are normal. Pupils are equal, round, and reactive to light. Right eye exhibits no discharge. Left eye exhibits no discharge. No scleral icterus.  Neck: Normal range of motion. Neck supple. No JVD present. No tracheal deviation present. No thyromegaly present.  Cardiovascular: Normal rate, regular rhythm, normal heart sounds and intact distal pulses. Exam reveals no gallop and no friction rub.  No murmur  heard. Pulmonary/Chest: Effort normal and breath sounds normal. No stridor. No respiratory distress. He has no wheezes. He has no rales. He exhibits no tenderness.  Abdominal: Soft. Bowel sounds are normal. He exhibits no distension and no mass. There is no tenderness. There is no rebound and no guarding.  Musculoskeletal: Normal range of motion. He exhibits no edema, tenderness or deformity.  Lymphadenopathy:    He has no cervical adenopathy.  Neurological: He is alert and oriented to person, place, and time. He has normal reflexes. He displays normal reflexes. No cranial nerve deficit. He exhibits normal muscle tone. Coordination normal.  Skin: Skin is warm. No rash noted. He is not diaphoretic. No erythema. No pallor.  Psychiatric: He has a normal mood and affect. His behavior is normal. Judgment and thought content normal.  Vitals reviewed.         Assessment & Plan:  General medical exam  Elevated blood sugar  Low HDL (under 40)  Physical exam today is completely normal. Flu shot is up-to-date. I recommended a shingles vaccine. He declined HIV screening. He declined hepatitis C screening due to lack of risk factors. Colonoscopy is due in 5 years. PSA is normal. Lab work is significant for an elevated fasting blood sugar, a low HDL cholesterol. I recommended a low carbohydrate diet, 30 minutes a day 5 days a week of aerobic exercise, and 10 pounds weight loss. Recheck blood work in 6 months.

## 2019-04-07 DIAGNOSIS — C439 Malignant melanoma of skin, unspecified: Secondary | ICD-10-CM

## 2019-04-07 HISTORY — DX: Malignant melanoma of skin, unspecified: C43.9

## 2020-04-02 ENCOUNTER — Encounter: Payer: Self-pay | Admitting: Physician Assistant

## 2020-04-02 ENCOUNTER — Ambulatory Visit: Payer: BC Managed Care – PPO | Admitting: Physician Assistant

## 2020-04-02 ENCOUNTER — Other Ambulatory Visit: Payer: Self-pay

## 2020-04-02 DIAGNOSIS — Z86006 Personal history of melanoma in-situ: Secondary | ICD-10-CM | POA: Diagnosis not present

## 2020-04-02 DIAGNOSIS — L409 Psoriasis, unspecified: Secondary | ICD-10-CM

## 2020-04-02 DIAGNOSIS — D485 Neoplasm of uncertain behavior of skin: Secondary | ICD-10-CM

## 2020-04-02 DIAGNOSIS — Z86007 Personal history of in-situ neoplasm of skin: Secondary | ICD-10-CM | POA: Diagnosis not present

## 2020-04-02 DIAGNOSIS — Z1283 Encounter for screening for malignant neoplasm of skin: Secondary | ICD-10-CM | POA: Diagnosis not present

## 2020-04-02 DIAGNOSIS — L570 Actinic keratosis: Secondary | ICD-10-CM

## 2020-04-02 DIAGNOSIS — L821 Other seborrheic keratosis: Secondary | ICD-10-CM

## 2020-04-02 MED ORDER — DUOBRII 0.01-0.045 % EX LOTN: 1.0000 "application " | TOPICAL_LOTION | Freq: Every day | CUTANEOUS | 0 refills | Status: DC

## 2020-04-02 NOTE — Patient Instructions (Signed)

## 2020-04-02 NOTE — Progress Notes (Signed)
   Follow up Visit  Subjective  Dustin Wood is a 64 y.o. male who presents for the following: Annual Exam (Concerns left side. wife noticed it. ). 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 Was excised and has healed well. Wife saw a spot on left side. Hasnt bothered patient.   Objective  Well appearing patient in no apparent distress; mood and affect are within normal limits.  All skin waist up examined. No suspicious moles noted on back.   Objective  Left Posterior Neck: Keratotic papule     Objective  Left Elbow - Posterior, Right Elbow - Posterior: Well-marginated erythematous papules/plaques with silvery scale.   Objective  Right Forearm - Anterior: Surgical scar examined no sign of recurrence.   Objective  Left Dorsal Hand, Left Submandibular Area, Left Temple, Mid Parietal Scalp, Right Dorsal Hand, Right Ear: Erythematous patches with gritty scale.  Objective  Left Abdomen (side) - Lower: Stuck-on, waxy papules and plaques.   Assessment & Plan  Neoplasm of uncertain behavior of skin Left Posterior Neck  Skin / nail biopsy Type of biopsy: tangential   Informed consent: discussed and consent obtained   Timeout: patient name, date of birth, surgical site, and procedure verified   Procedure prep:  Patient was prepped and draped in usual sterile fashion (Non sterile) Prep type:  Chlorhexidine Anesthesia: the lesion was anesthetized in a standard fashion   Anesthetic:  1% lidocaine w/ epinephrine 1-100,000 local infiltration Instrument used: flexible razor blade   Outcome: patient tolerated procedure well   Post-procedure details: wound care instructions given    Specimen 1 - Surgical pathology Differential Diagnosis: scc vs bcc Check Margins: No  Psoriasis (2) Left Elbow - Posterior; Right Elbow - Posterior  Ordered Medications: Halobetasol Prop-Tazarotene (DUOBRII) 0.01-0.045 % LOTN  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 Nightly for 3 weeks.  Seborrheic keratosis Left Abdomen (side) - Lower  No treatment

## 2020-04-06 ENCOUNTER — Telehealth: Payer: Self-pay | Admitting: *Deleted

## 2020-04-06 NOTE — Telephone Encounter (Signed)
Left message for patient to call us back.  

## 2020-04-06 NOTE — Telephone Encounter (Signed)
-----   Message from Dustin Wood, [place] sent at 04/06/2020 11:01 AM EDT ----- Precancer. Once its healed he can put the 33fu cream on it for 2-3 weeks

## 2020-04-06 NOTE — Telephone Encounter (Signed)
Pathology to patient.  °

## 2020-04-06 NOTE — Telephone Encounter (Signed)
-----   Message from Arlyss Gandy, [place] sent at 04/06/2020 11:01 AM EDT ----- Precancer. Once its healed he can put the 34fu cream on it for 2-3 weeks

## 2020-06-12 ENCOUNTER — Telehealth: Payer: Self-pay

## 2020-06-12 NOTE — Telephone Encounter (Signed)
Mr & Mrs. Dustin Wood are interested in having a Covid antibodies test as well as a Covid test. NO symptoms present !

## 2020-06-12 NOTE — Telephone Encounter (Signed)
Wife SUSAN Crowl  I am assuming they have not been vaccinated therefore asking for antibody testing- as that was not clarified below    Okay to place orders for antibody testing, advised some insurances may not cover the entire cost of the test    Okay to have PCR testing, advise it takes 48 hours on average to get results back, we will be closed but they can check mychart for results     Dx COVID testing     Also let pts know they have not been seen in > 2 years. We will often drop patients as not being active after 3 years, recommend they schedule a CPE with Dr. Dennard Schaumann

## 2020-06-12 NOTE — Telephone Encounter (Signed)
Please clarify why he is asking for both If he has been vaccinated he does not need antibodies done   We are only swabbing of pt are asymptomatic or require for travel   Otherwise he can come get antibodies drawn

## 2020-06-12 NOTE — Telephone Encounter (Signed)
Both patients are asymptomatic, they do have a son that's traveling from [place] and they would just like to be sure they're Neg.  Both would still like to have antibodies test done.

## 2020-06-13 ENCOUNTER — Other Ambulatory Visit: Payer: Self-pay | Admitting: *Deleted

## 2020-06-13 DIAGNOSIS — Z13220 Encounter for screening for lipoid disorders: Secondary | ICD-10-CM

## 2020-06-13 DIAGNOSIS — Z1159 Encounter for screening for other viral diseases: Secondary | ICD-10-CM

## 2020-06-13 DIAGNOSIS — Z125 Encounter for screening for malignant neoplasm of prostate: Secondary | ICD-10-CM

## 2020-06-13 DIAGNOSIS — Z20822 Contact with and (suspected) exposure to covid-19: Secondary | ICD-10-CM

## 2020-06-13 DIAGNOSIS — E786 Lipoprotein deficiency: Secondary | ICD-10-CM

## 2020-06-13 DIAGNOSIS — R739 Hyperglycemia, unspecified: Secondary | ICD-10-CM

## 2020-06-13 NOTE — Telephone Encounter (Signed)
Noted  

## 2020-06-13 NOTE — Telephone Encounter (Signed)
Call placed to patient.   Reports that he and spouse Manuela Schwartz would like to have Spike Antibody testing done to determine if they have had COVID and would like the actual number of antibodies. Patient has not been vaccinated at this time. Advised that number of antibodies is currently clinically insignificant as there is no set number to determine coverage at this time. Patient verbalized understanding, but would like to proceed. Advised that lab may not be covered by insurance. Verbalized understanding.   Also states that patient son will be traveling to [facility] for Christmas and he and spouse Manuela Schwartz will need COVID swab then. Advised that our COVID swab is PCR and will require 48hours to result. Advised to have swab done in December closer to Christmas.   Advised that CPE is needed as patient has not been seen >2 years. Appointment scheduled and lab orders placed.

## 2020-06-22 ENCOUNTER — Other Ambulatory Visit: Payer: BC Managed Care – PPO

## 2020-06-22 ENCOUNTER — Other Ambulatory Visit: Payer: Self-pay

## 2020-06-22 DIAGNOSIS — Z20822 Contact with and (suspected) exposure to covid-19: Secondary | ICD-10-CM

## 2020-06-22 DIAGNOSIS — Z125 Encounter for screening for malignant neoplasm of prostate: Secondary | ICD-10-CM

## 2020-06-22 DIAGNOSIS — Z13220 Encounter for screening for lipoid disorders: Secondary | ICD-10-CM

## 2020-06-22 DIAGNOSIS — R739 Hyperglycemia, unspecified: Secondary | ICD-10-CM

## 2020-06-22 DIAGNOSIS — E786 Lipoprotein deficiency: Secondary | ICD-10-CM

## 2020-06-22 DIAGNOSIS — Z1159 Encounter for screening for other viral diseases: Secondary | ICD-10-CM

## 2020-06-22 DIAGNOSIS — Z136 Encounter for screening for cardiovascular disorders: Secondary | ICD-10-CM

## 2020-06-23 LAB — LIPID PANEL
Cholesterol: 157 mg/dL (ref <200)
HDL: 29 mg/dL — ABNORMAL LOW (ref >=40)

## 2020-06-25 LAB — PSA: PSA: 0.98 ng/mL (ref <=4.0)

## 2020-06-25 LAB — LIPID PANEL
LDL Cholesterol (Calc): 95 mg/dL (calc)
Non-HDL Cholesterol (Calc): 128 mg/dL (calc) (ref <130)

## 2020-06-26 LAB — COMPLETE METABOLIC PANEL WITH GFR
AG Ratio: 1.2 (calc) (ref 1.0–2.5)
ALT: 24 U/L (ref 9–46)
AST: 16 U/L (ref 10–35)
Albumin: 3.6 g/dL (ref 3.6–5.1)
Alkaline phosphatase (APISO): 58 U/L (ref 35–144)
BUN: 14 mg/dL (ref 7–25)
CO2: 22 mmol/L (ref 20–32)
Calcium: 9 mg/dL (ref 8.6–10.3)
Chloride: 106 mmol/L (ref 98–110)
Creat: 0.98 mg/dL (ref 0.70–1.25)
GFR, Est African American: 95 mL/min/{1.73_m2} (ref >=60)
GFR, Est Non African American: 82 mL/min/{1.73_m2} (ref >=60)
Globulin: 3 g/dL (calc) (ref 1.9–3.7)
Glucose, Bld: 107 mg/dL — ABNORMAL HIGH (ref 65–99)
Potassium: 4.9 mmol/L (ref 3.5–5.3)
Sodium: 138 mmol/L (ref 135–146)
Total Bilirubin: 0.3 mg/dL (ref 0.2–1.2)
Total Protein: 6.6 g/dL (ref 6.1–8.1)

## 2020-06-26 LAB — CBC WITH DIFFERENTIAL/PLATELET
Absolute Monocytes: 722 cells/uL (ref 200–950)
Basophils Absolute: 43 cells/uL (ref 0–200)
Basophils Relative: 0.5 %
Eosinophils Absolute: 413 cells/uL (ref 15–500)
Eosinophils Relative: 4.8 %
HCT: 44 % (ref 38.5–50.0)
Hemoglobin: 15 g/dL (ref 13.2–17.1)
Lymphs Abs: 1677 cells/uL (ref 850–3900)
MCH: 28.4 pg (ref 27.0–33.0)
MCHC: 34.1 g/dL (ref 32.0–36.0)
MCV: 83.3 fL (ref 80.0–100.0)
MPV: 9.3 fL (ref 7.5–12.5)
Monocytes Relative: 8.4 %
Neutro Abs: 5745 cells/uL (ref 1500–7800)
Neutrophils Relative %: 66.8 %
Platelets: 287 10*3/uL (ref 140–400)
RBC: 5.28 10*6/uL (ref 4.20–5.80)
RDW: 13.4 % (ref 11.0–15.0)
Total Lymphocyte: 19.5 %
WBC: 8.6 10*3/uL (ref 3.8–10.8)

## 2020-06-26 LAB — HEPATITIS C ANTIBODY
Hepatitis C Ab: NONREACTIVE
SIGNAL TO CUT-OFF: 0 (ref <1.00)

## 2020-06-26 LAB — LIPID PANEL
Total CHOL/HDL Ratio: 5.4 (calc) — ABNORMAL HIGH (ref <5.0)
Triglycerides: 240 mg/dL — ABNORMAL HIGH (ref <150)

## 2020-06-26 LAB — SARS-COV-2 SEMI-QUANTITATIVE TOTAL ANTIBODY, SPIKE: SARS COV2 AB, Total Spike Semi QN: <0.4 U/mL (ref <0.8)

## 2020-06-29 ENCOUNTER — Ambulatory Visit (INDEPENDENT_AMBULATORY_CARE_PROVIDER_SITE_OTHER): Payer: BC Managed Care – PPO | Admitting: Family Medicine

## 2020-06-29 ENCOUNTER — Other Ambulatory Visit: Payer: Self-pay

## 2020-06-29 VITALS — BP 120/70 | HR 70 | Temp 97.7°F | Ht 69.0 in | Wt 198.0 lb

## 2020-06-29 DIAGNOSIS — Z20822 Contact with and (suspected) exposure to covid-19: Secondary | ICD-10-CM | POA: Diagnosis not present

## 2020-06-29 DIAGNOSIS — Z23 Encounter for immunization: Secondary | ICD-10-CM | POA: Diagnosis not present

## 2020-06-29 DIAGNOSIS — Z0001 Encounter for general adult medical examination with abnormal findings: Secondary | ICD-10-CM

## 2020-06-29 DIAGNOSIS — E786 Lipoprotein deficiency: Secondary | ICD-10-CM | POA: Diagnosis not present

## 2020-06-29 DIAGNOSIS — Z0000 Encounter for general adult medical examination without abnormal findings: Secondary | ICD-10-CM

## 2020-06-29 NOTE — Progress Notes (Signed)
Subjective:    Patient ID: Dustin Wood, male    DOB: August 08, 1955, 64 y.o.   MRN: [id number]  HPI Patient is a very pleasant 64 year old Caucasian male here today for complete physical exam.  He is due for a flu shot which he agrees to receive.  He is due for a Covid shot as well as the shingles vaccine which she politely declines.  His colonoscopy is up-to-date.  His most recent PSA was excellent.  He does report exposure to Covid and therefore we checked an antibody titer which shows no immunity.  His most recent lab work as listed below: Appointment on 06/22/2020  Component Date Value Ref Range Status  . SARS COV2 AB, Total Spike Semi QN 06/22/2020 <0.4  <0.8 U/mL Final   Comment: . INDEX          INTERPRETATION -----          --------------  <0.8          Negative > or = 0.8     Positive . This test is intended to help identify individuals with antibodies to SARS-CoV-2 (COVID-19). The results of this semi-quantitative test should not be interpreted as an indication or degree of immunity or protection from reinfection. . A test result that is 0.8 or more (Positive) means antibodies to SARS-CoV-2 were detected in the blood sample by the test. This could mean that the individual may have an immune response to a recent or prior infection with SARS-CoV-2. Positive results may occur after COVID-19 vaccination, but the clinical significance of a positive antibody result for individuals that have received a COVID-19 vaccine is unknown, and the performance of the test has not been established in COVID-19 vaccinees.  False positive results for the test may occur due to cross-reactivity from pre-existing antibodies or other possible causes. . . A                           test result that is less than 0.8 (Negative) means that antibodies were not detected in the blood sample by the test. This could mean that the individual has not been previously infected with SARS-CoV-2.  The clinical significance of a negative antibody result for individuals that have received a COVID-19 vaccine is unknown. The performance of the test has not been established in COVID-19 vaccinees. False negative results for the test may occur if the individual's antibodies have not reached a sufficient level for the test to be able to detect them.  Antibodies can take up to two to three weeks (sometimes longer) to develop after someone is infected.  How long antibodies to SARS-CoV-2 last after infection is not known. . This test should not be used to diagnose an active SARS-CoV-2 infection. If an active infection is suspected, direct molecular or antigen testing for SARS -CoV-2 is recommended. Please review the "Fact Sheets" available for healthcare providers and                           patients using the following websites: https://[email or web address]/home/Covid-19/HCP/ antibody/fact-sheet7 https://www.[email or web address]/home/Covid-19/ Patients/antibody/fact-sheet7 . Healthcare Providers: For additional information please refer to http://education.[facility].com/ faq/FAQ219(This link is being provided for informational/educational purposes only.) . This test has been authorized by the FDA under an Emergency Use Authorization(EUA)for use by authorized laboratories. The FDA authorized labeling is available on the [employer] website: www.[facility]. com/Covid19. .   . PSA 06/22/2020 0.98  < OR =  4.0 ng/mL Final   Comment: The total PSA value from this assay system is  standardized against the WHO standard. The test  result will be approximately 20% lower when compared  to the equimolar-standardized total PSA (Beckman  Coulter). Comparison of serial PSA results should be  interpreted with this fact in mind. . This test was performed using the Siemens  chemiluminescent method. Values obtained from  different assay methods cannot be  used interchangeably. PSA levels, regardless of value, should not be interpreted as absolute evidence of the presence or absence of disease.   . Cholesterol 06/22/2020 157  <200 mg/dL Final  . HDL 06/22/2020 29* > OR = 40 mg/dL Final  . Triglycerides 06/22/2020 240* <150 mg/dL Final   Comment: . If a non-fasting specimen was collected, consider repeat triglyceride testing on a fasting specimen if clinically indicated.  Yates Decamp et al. J. of Clin. Lipidol. 4166;0:630-160. .   . LDL Cholesterol (Calc) 06/22/2020 95  mg/dL (calc) Final   Comment: Reference range: <100 . Desirable range <100 mg/dL for primary prevention;   <70 mg/dL for patients with CHD or diabetic patients  with > or = 2 CHD risk factors. Marland Kitchen LDL-C is now calculated using the Martin-Hopkins  calculation, which is a validated novel method providing  better accuracy than the Friedewald equation in the  estimation of LDL-C.  Cresenciano Genre et al. Annamaria Helling. 1093;235(57): 2061-2068  (http://education.[facility].com/faq/FAQ164)   . Total CHOL/HDL Ratio 06/22/2020 5.4* <5.0 (calc) Final  . Non-HDL Cholesterol (Calc) 06/22/2020 128  <130 mg/dL (calc) Final   Comment: For patients with diabetes plus 1 major ASCVD risk  factor, treating to a non-HDL-C goal of <100 mg/dL  (LDL-C of <70 mg/dL) is considered a therapeutic  option.   . Hepatitis C Ab 06/22/2020 NON-REACTIVE  NON-REACTI Final  . SIGNAL TO CUT-OFF 06/22/2020 0.00  <1.00 Final   Comment: . HCV antibody was non-reactive. There is no laboratory  evidence of HCV infection. . In most cases, no further action is required. However, if recent HCV exposure is suspected, a test for HCV RNA (test code [phone]) is suggested. . For additional information please refer to http://education.[facility].com/faq/FAQ22v1 (This link is being provided for informational/ educational purposes only.) .   Marland Kitchen Glucose, Bld 06/22/2020 107* 65 - 99 mg/dL Final   Comment: .             Fasting reference interval . For someone without known diabetes, a glucose value between 100 and 125 mg/dL is consistent with prediabetes and should be confirmed with a follow-up test. .   . BUN 06/22/2020 14  7 - 25 mg/dL Final  . Creat 06/22/2020 0.98  0.70 - 1.25 mg/dL Final   Comment: For patients >61 years of age, the reference limit for Creatinine is approximately 13% higher for people identified as African-American. .   . GFR, Est Non African American 06/22/2020 82  > OR = 60 mL/min/1.43m2 Final  . GFR, Est African American 06/22/2020 95  > OR = 60 mL/min/1.65m2 Final  . BUN/Creatinine Ratio 54/27/0623 NOT APPLICABLE  6 - 22 (calc) Final  . Sodium 06/22/2020 138  135 - 146 mmol/L Final  . Potassium 06/22/2020 4.9  3.5 - 5.3 mmol/L Final  . Chloride 06/22/2020 106  98 - 110 mmol/L Final  . CO2 06/22/2020 22  20 - 32 mmol/L Final  . Calcium 06/22/2020 9.0  8.6 - 10.3 mg/dL Final  . Total Protein 06/22/2020 6.6  6.1 - 8.1 g/dL Final  .  Albumin 06/22/2020 3.6  3.6 - 5.1 g/dL Final  . Globulin 06/22/2020 3.0  1.9 - 3.7 g/dL (calc) Final  . AG Ratio 06/22/2020 1.2  1.0 - 2.5 (calc) Final  . Total Bilirubin 06/22/2020 0.3  0.2 - 1.2 mg/dL Final  . Alkaline phosphatase (APISO) 06/22/2020 58  35 - 144 U/L Final  . AST 06/22/2020 16  10 - 35 U/L Final  . ALT 06/22/2020 24  9 - 46 U/L Final  . WBC 06/22/2020 8.6  3.8 - 10.8 Thousand/uL Final  . RBC 06/22/2020 5.28  4.20 - 5.80 Million/uL Final  . Hemoglobin 06/22/2020 15.0  13.2 - 17.1 g/dL Final  . HCT 06/22/2020 44.0  38.5 - 50.0 % Final  . MCV 06/22/2020 83.3  80.0 - 100.0 fL Final  . MCH 06/22/2020 28.4  27.0 - 33.0 pg Final  . MCHC 06/22/2020 34.1  32.0 - 36.0 g/dL Final  . RDW 06/22/2020 13.4  11.0 - 15.0 % Final  . Platelets 06/22/2020 287  140 - 400 Thousand/uL Final  . MPV 06/22/2020 9.3  7.5 - 12.5 fL Final  . Neutro Abs 06/22/2020 5,745  1,500 - 7,800 cells/uL Final  . Lymphs Abs 06/22/2020 1,677  850 - 3,900  cells/uL Final  . Absolute Monocytes 06/22/2020 722  200 - 950 cells/uL Final  . Eosinophils Absolute 06/22/2020 413  15 - 500 cells/uL Final  . Basophils Absolute 06/22/2020 43  0 - 200 cells/uL Final  . Neutrophils Relative % 06/22/2020 66.8  % Final  . Total Lymphocyte 06/22/2020 19.5  % Final  . Monocytes Relative 06/22/2020 8.4  % Final  . Eosinophils Relative 06/22/2020 4.8  % Final  . Basophils Relative 06/22/2020 0.5  % Final   No past medical history on file.  Denies surgeries  Allergies  Allergen Reactions  . Penicillins Other (See Comments)    Childhood allergy   Social History   Socioeconomic History  . Marital status: Single    Spouse name: Not on file  . Number of children: Not on file  . Years of education: Not on file  . Highest education level: Not on file  Occupational History  . Not on file  Tobacco Use  . Smoking status: Never Smoker  . Smokeless tobacco: Never Used  Substance and Sexual Activity  . Alcohol use: No  . Drug use: No    Frequency: 3.0 times per week  . Sexual activity: Not on file  Other Topics Concern  . Not on file  Social History Narrative  . Not on file   Social Determinants of Health   Financial Resource Strain: Not on file  Food Insecurity: Not on file  Transportation Needs: Not on file  Physical Activity: Not on file  Stress: Not on file  Social Connections: Not on file  Intimate Partner Violence: Not on file      Review of Systems  All other systems reviewed and are negative.      Objective:   Physical Exam Vitals reviewed.  Constitutional:      General: He is not in acute distress.    Appearance: He is well-developed. He is not diaphoretic.  HENT:     Head: Normocephalic and atraumatic.     Right Ear: External ear normal.     Left Ear: External ear normal.     Nose: Nose normal.     Mouth/Throat:     Pharynx: No oropharyngeal exudate.  Eyes:     General: No scleral  icterus.       Right eye: No  discharge.        Left eye: No discharge.     Conjunctiva/sclera: Conjunctivae normal.     Pupils: Pupils are equal, round, and reactive to light.  Neck:     Thyroid: No thyromegaly.     Vascular: No JVD.     Trachea: No tracheal deviation.  Cardiovascular:     Rate and Rhythm: Normal rate and regular rhythm.     Heart sounds: Normal heart sounds. No murmur heard. No friction rub. No gallop.   Pulmonary:     Effort: Pulmonary effort is normal. No respiratory distress.     Breath sounds: Normal breath sounds. No stridor. No wheezing or rales.  Chest:     Chest wall: No tenderness.  Abdominal:     General: Bowel sounds are normal. There is no distension.     Palpations: Abdomen is soft. There is no mass.     Tenderness: There is no abdominal tenderness. There is no guarding or rebound.  Musculoskeletal:        General: No tenderness or deformity. Normal range of motion.     Cervical back: Normal range of motion and neck supple.  Lymphadenopathy:     Cervical: No cervical adenopathy.  Skin:    General: Skin is warm.     Coloration: Skin is not pale.     Findings: No erythema or rash.  Neurological:     Mental Status: He is alert and oriented to person, place, and time.     Cranial Nerves: No cranial nerve deficit.     Motor: No abnormal muscle tone.     Coordination: Coordination normal.     Deep Tendon Reflexes: Reflexes are normal and symmetric.  Psychiatric:        Behavior: Behavior normal.        Thought Content: Thought content normal.        Judgment: Judgment normal.           Assessment & Plan:  Close exposure to COVID-19 virus - Plan: CANCELED: SARS CoV2 Serology(COVID19) AB(IgG,IgM),Immunoassay  Low HDL (under 40)  General medical exam  Colonoscopy is up-to-date.  Blood pressure is excellent.  I did recommend fish oil 2000 mg a day due to low HDL cholesterol.  We discussed lower urinary tract symptoms and I recommended trying over-the-counter saw  palmetto.  Recommended the shingles vaccine and the Covid vaccine but he politely declined.  Recommended a flu shot which he received.  The remainder of his preventative care is up-to-date.

## 2020-11-05 ENCOUNTER — Ambulatory Visit: Payer: BC Managed Care – PPO | Admitting: Physician Assistant

## 2020-11-05 ENCOUNTER — Ambulatory Visit: Payer: BC Managed Care – PPO | Admitting: Dermatology

## 2021-01-14 ENCOUNTER — Ambulatory Visit: Payer: Self-pay | Admitting: Dermatology

## 2021-01-24 ENCOUNTER — Telehealth: Payer: Self-pay | Admitting: *Deleted

## 2021-01-24 NOTE — Telephone Encounter (Signed)
Received VM from patient.   Reports that he has been seen in the past for nail fungus and he would like to try the oral medication for it.   Please advise.

## 2021-01-25 ENCOUNTER — Other Ambulatory Visit: Payer: Self-pay | Admitting: *Deleted

## 2021-01-25 DIAGNOSIS — Z9189 Other specified personal risk factors, not elsewhere classified: Secondary | ICD-10-CM

## 2021-01-25 DIAGNOSIS — Z79899 Other long term (current) drug therapy: Secondary | ICD-10-CM

## 2021-01-25 MED ORDER — TERBINAFINE HCL 250 MG PO TABS: 250.0000 mg | ORAL_TABLET | Freq: Every day | ORAL | 3 refills | Status: DC

## 2021-01-25 NOTE — Telephone Encounter (Signed)
Call placed to patient. LMTRC.  

## 2021-01-25 NOTE — Addendum Note (Signed)
Addended by: Sheral Flow on: 01/25/2021 11:15 AM   Modules accepted: Orders

## 2021-01-25 NOTE — Telephone Encounter (Signed)
Patient returned call and made aware.   Agreeable to plan. Prescription sent to pharmacy.  Future lab orders placed.

## 2021-01-28 ENCOUNTER — Encounter: Payer: Self-pay | Admitting: Dermatology

## 2021-01-28 ENCOUNTER — Ambulatory Visit: Payer: BC Managed Care – PPO | Admitting: Dermatology

## 2021-01-28 ENCOUNTER — Other Ambulatory Visit: Payer: Self-pay

## 2021-01-28 DIAGNOSIS — Z85820 Personal history of malignant melanoma of skin: Secondary | ICD-10-CM

## 2021-01-28 DIAGNOSIS — L821 Other seborrheic keratosis: Secondary | ICD-10-CM | POA: Diagnosis not present

## 2021-01-28 DIAGNOSIS — Z8589 Personal history of malignant neoplasm of other organs and systems: Secondary | ICD-10-CM

## 2021-01-28 DIAGNOSIS — Z1283 Encounter for screening for malignant neoplasm of skin: Secondary | ICD-10-CM

## 2021-01-28 DIAGNOSIS — Z85828 Personal history of other malignant neoplasm of skin: Secondary | ICD-10-CM

## 2021-01-28 DIAGNOSIS — L570 Actinic keratosis: Secondary | ICD-10-CM

## 2021-01-28 MED ORDER — TOLAK 4 % EX CREA: 1.0000 "application " | TOPICAL_CREAM | Freq: Every day | CUTANEOUS | 1 refills | Status: DC

## 2021-02-09 ENCOUNTER — Encounter: Payer: Self-pay | Admitting: Dermatology

## 2021-02-09 NOTE — Progress Notes (Signed)
   Follow-Up Visit   Subjective  Dustin Wood is a 65 y.o. male who presents for the following: Follow-up (Left abdomen raised area he wants checked, patient has history of MIS right forearm 2020 ).  History of melanoma, several areas to check Location:  Duration:  Quality:  Associated Signs/Symptoms: Modifying Factors:  Severity:  Timing: Context:   Objective  Well appearing patient in no apparent distress; mood and affect are within normal limits. Multiple 4 to 6 mm brown flattopped textured papules  Erythematous patches with gritty scale.  Head - Anterior (Face) No sign recurrence  Left Forearm - Anterior Wound clear without repigmentation.  Torso - Posterior (Back) No atypical pigmented lesions, no new nonmelanoma skin cancer.    All skin waist up examined.   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  Seborrheic keratosis  No intervention currently indicated.  AK (actinic keratosis)  Fluorouracil (TOLAK) 4 % CREA Apply 1 application topically daily.  Fluorouracil (TOLAK) 4 % CREA Apply 1 application topically daily.  Encounter for screening for malignant neoplasm of skin Torso - Posterior (Back)  Annual skin examination, encouraged to self examine twice annually.  Continued ultraviolet protection.      I, Lavonna Monarch, MD, have reviewed all documentation for this visit.  The documentation on 02/09/21 for the exam, diagnosis, procedures, and orders are all accurate and complete.

## 2021-03-08 ENCOUNTER — Other Ambulatory Visit: Payer: Self-pay

## 2021-03-08 ENCOUNTER — Other Ambulatory Visit: Payer: Self-pay | Admitting: Family Medicine

## 2021-03-08 ENCOUNTER — Other Ambulatory Visit: Payer: BC Managed Care – PPO

## 2021-03-08 DIAGNOSIS — Z79899 Other long term (current) drug therapy: Secondary | ICD-10-CM

## 2021-03-08 DIAGNOSIS — Z9189 Other specified personal risk factors, not elsewhere classified: Secondary | ICD-10-CM

## 2021-03-09 LAB — COMPLETE METABOLIC PANEL WITH GFR
AG Ratio: 1.4 (calc) (ref 1.0–2.5)
ALT: 24 U/L (ref 9–46)
AST: 14 U/L (ref 10–35)
Albumin: 3.9 g/dL (ref 3.6–5.1)
Alkaline phosphatase (APISO): 58 U/L (ref 35–144)
BUN: 16 mg/dL (ref 7–25)
CO2: 27 mmol/L (ref 20–32)
Calcium: 9.2 mg/dL (ref 8.6–10.3)
Chloride: 104 mmol/L (ref 98–110)
Creat: 0.97 mg/dL (ref 0.70–1.35)
Globulin: 2.7 g/dL (calc) (ref 1.9–3.7)
Glucose, Bld: 112 mg/dL — ABNORMAL HIGH (ref 65–99)
Potassium: 4.3 mmol/L (ref 3.5–5.3)
Sodium: 139 mmol/L (ref 135–146)
Total Bilirubin: 0.3 mg/dL (ref 0.2–1.2)
Total Protein: 6.6 g/dL (ref 6.1–8.1)
eGFR: 88 mL/min/{1.73_m2} (ref >=60)

## 2022-01-28 ENCOUNTER — Ambulatory Visit: Payer: BC Managed Care – PPO | Admitting: Dermatology

## 2022-01-28 ENCOUNTER — Encounter: Payer: Self-pay | Admitting: Dermatology

## 2022-01-28 DIAGNOSIS — Z1283 Encounter for screening for malignant neoplasm of skin: Secondary | ICD-10-CM

## 2022-01-28 DIAGNOSIS — L570 Actinic keratosis: Secondary | ICD-10-CM

## 2022-01-28 DIAGNOSIS — L821 Other seborrheic keratosis: Secondary | ICD-10-CM

## 2022-01-28 DIAGNOSIS — Z85820 Personal history of malignant melanoma of skin: Secondary | ICD-10-CM | POA: Diagnosis not present

## 2022-01-28 DIAGNOSIS — D489 Neoplasm of uncertain behavior, unspecified: Secondary | ICD-10-CM | POA: Diagnosis not present

## 2022-02-19 ENCOUNTER — Encounter: Payer: Self-pay | Admitting: Dermatology

## 2022-02-19 NOTE — Progress Notes (Signed)
   Follow-Up Visit   Subjective  Dustin Wood is a 66 y.o. male who presents for the following: Annual Exam (Patient here today for yearly skin check, no concerns. Personal history of melanoma and non mole skin cancer. ).  General skin examination, crust on left hand Location:  Duration:  Quality:  Associated Signs/Symptoms: Modifying Factors:  Severity:  Timing: Context:   Objective  Well appearing patient in no apparent distress; mood and affect are within normal limits. Full Body No atypical nevi (all pigmented lesions checked with dermoscopy) or signs of NMSC noted at the time of the visit.   Left Dorsal Hand Hornlike 1.4 mm pink crust  Right Flank 5 mm brown flattopped textured papule, compatible dermoscopy  Scalp Erythematous patches with gritty scale.  Mid Back Subtle 3 mm waxy pink papule, uncertain duration.  I am uncertain if this is sun damage or inflammatory.    A full examination was performed including scalp, head, eyes, ears, nose, lips, neck, chest, axillae, abdomen, back, buttocks, bilateral upper extremities, bilateral lower extremities, hands, feet, fingers, toes, fingernails, and toenails. All findings within normal limits unless otherwise noted below.   Assessment & Plan    Skin exam for malignant neoplasm Full Body  Yearly skin check  Hypertrophic actinic keratosis Left Dorsal Hand  Destruction of lesion - Left Dorsal Hand Complexity: simple   Destruction method: cryotherapy   Informed consent: discussed and consent obtained   Timeout:  patient name, date of birth, surgical site, and procedure verified Lesion destroyed using liquid nitrogen: Yes   Cryotherapy cycles:  3 Outcome: patient tolerated procedure well with no complications    Seborrheic keratosis Right Flank  Leave if stable  Actinic keratosis Scalp  Patient can repeat Tolak use in the winter.   Neoplasm of uncertain behavior Mid Back  Will have wife watch, no  specimen taken today.       I, Lavonna Monarch, MD, have reviewed all documentation for this visit.  The documentation on 02/19/22 for the exam, diagnosis, procedures, and orders are all accurate and complete.

## 2022-03-27 ENCOUNTER — Ambulatory Visit: Payer: BC Managed Care – PPO | Admitting: Family Medicine

## 2022-03-27 VITALS — BP 120/62 | HR 82 | Temp 98.0°F | Ht 69.0 in | Wt 200.0 lb

## 2022-03-27 DIAGNOSIS — L980 Pyogenic granuloma: Secondary | ICD-10-CM | POA: Diagnosis not present

## 2022-03-27 NOTE — Progress Notes (Signed)
Subjective:    Patient ID: Dustin Wood, male    DOB: 1955/08/23, 66 y.o.   MRN: [id number]  HPI  Patient injured, is left thumb about 2 to 3 weeks ago with a "huff pick" which is a sharp metal pick used to clean pedal forces.  The wound will not heal.  There is now a pyogenic granuloma forming on the lateral aspect of the left thumb.  It bleeds easily.  It also is very sore. Past Medical History:  Diagnosis Date   Melanoma ([place]) 04/07/2019   right fore arm MIS TX EXC   SCC (squamous cell carcinoma) 06/30/2016   RIGHT DORSAL HAND CX3 5FU   SCC (squamous cell carcinoma) 06/30/2016   LEFT HAND TX CX3 5FU   Squamous cell carcinoma of skin 12/2015   RIGHT TEMPLE CX3 5FU    Denies surgeries  Allergies  Allergen Reactions   Penicillins Other (See Comments)    Childhood allergy   Social History   Socioeconomic History   Marital status: Single    Spouse name: Not on file   Number of children: Not on file   Years of education: Not on file   Highest education level: Not on file  Occupational History   Not on file  Tobacco Use   Smoking status: Never   Smokeless tobacco: Never  Vaping Use   Vaping Use: Never used  Substance and Sexual Activity   Alcohol use: No   Drug use: No    Frequency: 3.0 times per week   Sexual activity: Not on file  Other Topics Concern   Not on file  Social History Narrative   Not on file   Social Determinants of Health   Financial Resource Strain: Not on file  Food Insecurity: Not on file  Transportation Needs: Not on file  Physical Activity: Not on file  Stress: Not on file  Social Connections: Not on file  Intimate Partner Violence: Not on file      Review of Systems  All other systems reviewed and are negative.      Objective:   Physical Exam Vitals reviewed.  Constitutional:      General: He is not in acute distress.    Appearance: He is well-developed. He is not diaphoretic.  HENT:     Head: Normocephalic and  atraumatic.  Neck:     Thyroid: No thyromegaly.     Vascular: No JVD.     Trachea: No tracheal deviation.  Cardiovascular:     Rate and Rhythm: Normal rate and regular rhythm.     Heart sounds: Normal heart sounds. No murmur heard.    No friction rub. No gallop.  Pulmonary:     Effort: Pulmonary effort is normal. No respiratory distress.     Breath sounds: Normal breath sounds. No stridor. No wheezing or rales.  Chest:     Chest wall: No tenderness.  Musculoskeletal:        General: Deformity present.  Skin:    General: Skin is warm.     Coloration: Skin is not pale.     Findings: Lesion present. No erythema or rash.  Neurological:     Mental Status: He is alert.     Motor: No abnormal muscle tone.     Deep Tendon Reflexes: Reflexes are normal and symmetric.  Psychiatric:        Behavior: Behavior normal.        Thought Content: Thought content normal.  Judgment: Judgment normal.           Assessment & Plan:  Pyogenic granuloma Lesion is consistent with pyogenic granuloma.  We discussed a referral to dermatology for electrodesiccation.  I do not have the supplies to do that in this clinic.  We also discussed surgical debridement using a razor blade and then aggressive chemical cautery with silver nitrate.  The patient would like to try that here to avoid having to see a dermatologist.  Therefore I anesthetized his left thumb with 0.1% lidocaine without epinephrine using a digital block.  A tourniquet was applied to the base of his finger.  I then used a razor blade and a curette to remove the majority of the granulation tissue from the lesion.  I then aggressively cauterized the wound with several sticks of silver nitrate.  The wound was then covered with Neosporin, petroleum gauze, and wrapped in Coban.  Wound care was discussed.  If persistent will consult dermatology

## 2022-04-13 ENCOUNTER — Other Ambulatory Visit: Payer: Self-pay

## 2022-04-13 ENCOUNTER — Encounter (HOSPITAL_BASED_OUTPATIENT_CLINIC_OR_DEPARTMENT_OTHER): Payer: Self-pay | Admitting: Emergency Medicine

## 2022-04-13 ENCOUNTER — Inpatient Hospital Stay (HOSPITAL_BASED_OUTPATIENT_CLINIC_OR_DEPARTMENT_OTHER)
Admission: EM | Admit: 2022-04-13 | Discharge: 2022-04-21 | DRG: 234 | Disposition: A | Discharge status: Home / Self Care | Payer: BC Managed Care – PPO | Attending: Thoracic Surgery (Cardiothoracic Vascular Surgery) | Admitting: Thoracic Surgery (Cardiothoracic Vascular Surgery)

## 2022-04-13 ENCOUNTER — Emergency Department (HOSPITAL_BASED_OUTPATIENT_CLINIC_OR_DEPARTMENT_OTHER): Payer: BC Managed Care – PPO

## 2022-04-13 ENCOUNTER — Encounter (HOSPITAL_COMMUNITY): Payer: Self-pay

## 2022-04-13 DIAGNOSIS — R066 Hiccough: Secondary | ICD-10-CM | POA: Diagnosis not present

## 2022-04-13 DIAGNOSIS — Z1152 Encounter for screening for COVID-19: Secondary | ICD-10-CM | POA: Diagnosis not present

## 2022-04-13 DIAGNOSIS — Z85820 Personal history of malignant melanoma of skin: Secondary | ICD-10-CM

## 2022-04-13 DIAGNOSIS — Z880 Allergy status to penicillin: Secondary | ICD-10-CM

## 2022-04-13 DIAGNOSIS — E1159 Type 2 diabetes mellitus with other circulatory complications: Secondary | ICD-10-CM | POA: Diagnosis not present

## 2022-04-13 DIAGNOSIS — D62 Acute posthemorrhagic anemia: Secondary | ICD-10-CM | POA: Diagnosis not present

## 2022-04-13 DIAGNOSIS — Z951 Presence of aortocoronary bypass graft: Secondary | ICD-10-CM

## 2022-04-13 DIAGNOSIS — J9811 Atelectasis: Secondary | ICD-10-CM | POA: Diagnosis present

## 2022-04-13 DIAGNOSIS — Z79899 Other long term (current) drug therapy: Secondary | ICD-10-CM

## 2022-04-13 DIAGNOSIS — E1165 Type 2 diabetes mellitus with hyperglycemia: Secondary | ICD-10-CM | POA: Diagnosis present

## 2022-04-13 DIAGNOSIS — E119 Type 2 diabetes mellitus without complications: Secondary | ICD-10-CM

## 2022-04-13 DIAGNOSIS — Z85828 Personal history of other malignant neoplasm of skin: Secondary | ICD-10-CM | POA: Diagnosis not present

## 2022-04-13 DIAGNOSIS — E781 Pure hyperglyceridemia: Secondary | ICD-10-CM | POA: Diagnosis present

## 2022-04-13 DIAGNOSIS — I214 Non-ST elevation (NSTEMI) myocardial infarction: Secondary | ICD-10-CM | POA: Diagnosis present

## 2022-04-13 DIAGNOSIS — I2510 Atherosclerotic heart disease of native coronary artery without angina pectoris: Secondary | ICD-10-CM | POA: Insufficient documentation

## 2022-04-13 DIAGNOSIS — D72829 Elevated white blood cell count, unspecified: Secondary | ICD-10-CM | POA: Diagnosis present

## 2022-04-13 DIAGNOSIS — Z01810 Encounter for preprocedural cardiovascular examination: Secondary | ICD-10-CM | POA: Diagnosis not present

## 2022-04-13 DIAGNOSIS — E1169 Type 2 diabetes mellitus with other specified complication: Secondary | ICD-10-CM | POA: Diagnosis not present

## 2022-04-13 DIAGNOSIS — L409 Psoriasis, unspecified: Secondary | ICD-10-CM | POA: Diagnosis present

## 2022-04-13 DIAGNOSIS — I10 Essential (primary) hypertension: Secondary | ICD-10-CM | POA: Diagnosis present

## 2022-04-13 DIAGNOSIS — I25110 Atherosclerotic heart disease of native coronary artery with unstable angina pectoris: Secondary | ICD-10-CM | POA: Diagnosis present

## 2022-04-13 DIAGNOSIS — Z9911 Dependence on respirator [ventilator] status: Secondary | ICD-10-CM | POA: Diagnosis not present

## 2022-04-13 DIAGNOSIS — E785 Hyperlipidemia, unspecified: Secondary | ICD-10-CM | POA: Diagnosis not present

## 2022-04-13 HISTORY — DX: Non-ST elevation (NSTEMI) myocardial infarction: I21.4

## 2022-04-13 LAB — CBC
HCT: 43.9 % (ref 39.0–52.0)
Hemoglobin: 14.9 g/dL (ref 13.0–17.0)
MCH: 27.6 pg (ref 26.0–34.0)
MCHC: 33.9 g/dL (ref 30.0–36.0)
MCV: 81.4 fL (ref 80.0–100.0)
Platelets: 290 10*3/uL (ref 150–400)
RBC: 5.39 MIL/uL (ref 4.22–5.81)
RDW: 13.3 % (ref 11.5–15.5)
WBC: 9.6 10*3/uL (ref 4.0–10.5)
nRBC: 0 % (ref 0.0–0.2)

## 2022-04-13 LAB — BASIC METABOLIC PANEL
Anion gap: 7 (ref 5–15)
BUN: 17 mg/dL (ref 8–23)
CO2: 24 mmol/L (ref 22–32)
Calcium: 8.5 mg/dL — ABNORMAL LOW (ref 8.9–10.3)
Chloride: 105 mmol/L (ref 98–111)
Creatinine, Ser: 1.01 mg/dL (ref 0.61–1.24)
GFR, Estimated: >60 mL/min (ref >60)
Glucose, Bld: 167 mg/dL — ABNORMAL HIGH (ref 70–99)
Potassium: 3.8 mmol/L (ref 3.5–5.1)
Sodium: 136 mmol/L (ref 135–145)

## 2022-04-13 LAB — PROTIME-INR
INR: 1 (ref 0.8–1.2)
Prothrombin Time: 12.7 seconds (ref 11.4–15.2)

## 2022-04-13 LAB — HEPARIN LEVEL (UNFRACTIONATED): Heparin Unfractionated: 0.13 IU/mL — ABNORMAL LOW (ref 0.30–0.70)

## 2022-04-13 LAB — TROPONIN I (HIGH SENSITIVITY)
Troponin I (High Sensitivity): 160 ng/L (ref <18)
Troponin I (High Sensitivity): 286 ng/L (ref <18)
Troponin I (High Sensitivity): 608 ng/L (ref <18)

## 2022-04-13 MED ORDER — HEPARIN BOLUS VIA INFUSION
4000.0000 [IU] | Freq: Once | INTRAVENOUS | Status: AC
  Administered 2022-04-13: 4000 [IU] via INTRAVENOUS

## 2022-04-13 MED ORDER — ASPIRIN 81 MG PO CHEW
324.0000 mg | CHEWABLE_TABLET | Freq: Once | ORAL | Status: AC
  Administered 2022-04-13: 324 mg via ORAL
  Filled 2022-04-13: qty 4

## 2022-04-13 MED ORDER — HEPARIN (PORCINE) 25000 UT/250ML-% IV SOLN
1400.0000 [IU]/h | INTRAVENOUS | Status: DC
  Administered 2022-04-13: 1200 [IU]/h via INTRAVENOUS
  Administered 2022-04-14: 1400 [IU]/h via INTRAVENOUS
  Filled 2022-04-13 (×2): qty 250

## 2022-04-13 MED ORDER — ONDANSETRON HCL 4 MG/2ML IJ SOLN: 4.0000 mg | Freq: Four times a day (QID) | INTRAMUSCULAR | Status: DC | PRN

## 2022-04-13 MED ORDER — ACETAMINOPHEN 325 MG PO TABS: 650.0000 mg | ORAL_TABLET | ORAL | Status: DC | PRN

## 2022-04-13 MED ORDER — NITROGLYCERIN 0.4 MG SL SUBL: 0.4000 mg | SUBLINGUAL_TABLET | SUBLINGUAL | Status: DC | PRN

## 2022-04-13 MED ORDER — ASPIRIN 81 MG PO TBEC
81.0000 mg | DELAYED_RELEASE_TABLET | Freq: Every day | ORAL | Status: DC
  Administered 2022-04-15 – 2022-04-16 (×2): 81 mg via ORAL
  Filled 2022-04-13 (×2): qty 1

## 2022-04-13 MED ORDER — HEPARIN BOLUS VIA INFUSION
2000.0000 [IU] | Freq: Once | INTRAVENOUS | Status: AC
  Administered 2022-04-13: 2000 [IU] via INTRAVENOUS
  Filled 2022-04-13: qty 2000

## 2022-04-13 NOTE — ED Notes (Signed)
Patient denies any pain.

## 2022-04-13 NOTE — ED Provider Notes (Signed)
[facility] EMERGENCY DEPARTMENT Provider Note   CSN: [id number] Arrival date & time: 04/13/22  1413     History  Chief Complaint  Patient presents with   Chest Pain    Dustin Wood is a 66 y.o. male.  HPI Patient has no prior cardiac history.  He reports he has been feeling well.  He reports yesterday evening working in the barn he got some chest pressure that resolved with decreased activity yesterday.  He reports today he was taking a hike with his wife and he started to get pain in his left chest.  There was a pressure component but then also sharper pain radiating up towards his left shoulder and some aching in the left arm.  In conjunction with this he felt some nausea and light sweat.  Patient reports that when he stopped exerting the pain improved.  He reports he had his daughter pick him up from the hike and once he was at rest the pain resolved completely.  Patient is a non-smoker.  No recent travel history or DVT history.  No history of hypertension.  Reports he felt well recently no recent problems with fever cough body aches or chest pain.    Home Medications Prior to Admission medications   Medication Sig Start Date End Date Taking? Authorizing Provider  Betamethasone Dipropionate (SERNIVO EX) Apply topically. Patient not taking: Reported on 01/28/2021    [provider]  fluorouracil (EFUDEX) 5 % cream APPLY TO AFFECTED AREA(S) AT BEDTIME AS DIRECTED 03/08/21   Susy Frizzle, MD  Fluorouracil (TOLAK) 4 % CREA Apply 1 application topically daily. Patient not taking: Reported on 01/28/2022 01/28/21   Lavonna Monarch, MD  Fluorouracil (TOLAK) 4 % CREA Apply 1 application topically daily. 01/28/21   Lavonna Monarch, MD  Halobetasol Prop-Tazarotene (DUOBRII) 0.01-0.045 % LOTN Apply 1 application topically daily. Patient not taking: Reported on 01/28/2021 04/02/20   Clark-Burning, Anderson Malta, PA-C  Multiple Vitamin (MULITIVITAMIN WITH MINERALS) TABS Take 1 tablet  by mouth daily.    [provider]  terbinafine (LAMISIL) 250 MG tablet Take 1 tablet (250 mg total) by mouth daily. Patient not taking: Reported on 01/28/2021 01/25/21   Susy Frizzle, MD      Allergies    Penicillins    Review of Systems   Review of Systems  Physical Exam Updated Vital Signs BP 107/70   Pulse 74   Temp 98.3 F (36.8 C) (Oral)   Resp (!) 22   Ht '5\' 9"'$  (1.753 m)   Wt 90.7 kg   SpO2 94%   BMI 29.53 kg/m  Physical Exam Constitutional:      Comments: Patient is alert nontoxic no respiratory distress.  Well-nourished well-developed.  HENT:     Mouth/Throat:     Pharynx: Oropharynx is clear.  Eyes:     Extraocular Movements: Extraocular movements intact.  Cardiovascular:     Rate and Rhythm: Normal rate and regular rhythm.  Pulmonary:     Effort: Pulmonary effort is normal.     Breath sounds: Normal breath sounds.  Abdominal:     General: There is no distension.     Palpations: Abdomen is soft.     Tenderness: There is no abdominal tenderness. There is no guarding.  Musculoskeletal:        General: No swelling or tenderness. Normal range of motion.     Right lower leg: No edema.     Left lower leg: No edema.  Skin:  General: Skin is warm and dry.  Neurological:     General: No focal deficit present.     Mental Status: He is oriented to person, place, and time.     Motor: No weakness.     Coordination: Coordination normal.  Psychiatric:        Mood and Affect: Mood normal.     ED Results / Procedures / Treatments   Labs (all labs ordered are listed, but only abnormal results are displayed) Labs Reviewed  BASIC METABOLIC PANEL - Abnormal; Notable for the following components:      Result Value   Glucose, Bld 167 (*)    Calcium 8.5 (*)    All other components within normal limits  TROPONIN I (HIGH SENSITIVITY) - Abnormal; Notable for the following components:   Troponin I (High Sensitivity) 160 (*)    All other components within  normal limits  CBC  PROTIME-INR  HEPARIN LEVEL (UNFRACTIONATED)  TROPONIN I (HIGH SENSITIVITY)    EKG EKG Interpretation  Date/Time:  Sunday April 13 2022 14:27:15 EDT Ventricular Rate:  95 PR Interval:  162 QRS Duration: 84 QT Interval:  342 QTC Calculation: 429 R Axis:   -46 Text Interpretation: Normal sinus rhythm no acute ischemic appearance, anterior t waves less acute compared to previous, otherwise similar When compared with ECG of 19-Sep-2011 11:54, PREVIOUS ECG IS PRESENT  Confirmed by Charlesetta Shanks [phone]) on 04/13/2022 3:02:55 PM  Radiology DG Chest 2 View  Result Date: 04/13/2022 CLINICAL DATA:  Chest pain. EXAM: CHEST - 2 VIEW COMPARISON:  None Available. FINDINGS: The heart size and mediastinal contours are within normal limits. Both lungs are clear. The visualized skeletal structures are unremarkable. IMPRESSION: No active cardiopulmonary disease. Electronically Signed   By: Dorise Bullion III M.D.   On: 04/13/2022 14:48    Procedures Procedures   CRITICAL CARE Performed by: Charlesetta Shanks   Total critical care time: 30 minutes  Critical care time was exclusive of separately billable procedures and treating other patients.  Critical care was necessary to treat or prevent imminent or life-threatening deterioration.  Critical care was time spent personally by me on the following activities: development of treatment plan with patient and/or surrogate as well as nursing, discussions with consultants, evaluation of patient's response to treatment, examination of patient, obtaining history from patient or surrogate, ordering and performing treatments and interventions, ordering and review of laboratory studies, ordering and review of radiographic studies, pulse oximetry and re-evaluation of patient's condition.  Medications Ordered in ED Medications  heparin bolus via infusion 4,000 Units (has no administration in time range)  heparin ADULT infusion 100  units/mL (25000 units/226m) (has no administration in time range)  aspirin chewable tablet 324 mg (324 mg Oral Given 04/13/22 1517)    ED Course/ Medical Decision Making/ A&P                           Medical Decision Making Amount and/or Complexity of Data Reviewed Labs: ordered. Radiology: ordered.  Risk OTC drugs. Prescription drug management. Decision regarding hospitalization.   Patient presents with exertional chest pain with associated symptoms of shortness of breath, nausea and light diaphoresis.  History is concerning for ACS.  At this time he is pain-free.  Patient does not have significant risk factors.  We will proceed with diagnostic evaluation for ACS.  Will administer a dose of aspirin.  EKG reviewed by myself does not show any acute ischemic appearance.  Patient  troponin results at 161.  Chest x-ray no acute findings by radiology interpretation.  I have also personally examined the images.  No mediastinal widening, cardiomegaly, infiltrates or pneumothorax.  Recheck 16: 10.  Patient remains pain-free.  Vital signs are stable.  At this time findings are consistent with NSTEMI with history consistent with unstable angina over the past 24 hours and opponent elevation.  Will initiate heparin and cardiology consult for admission.  Aspirin ordered.  Consult: Reviewed with Dr. Jolyn Nap for admission to [facility]        Final Clinical Impression(s) / ED Diagnoses Final diagnoses:  NSTEMI (non-ST elevated myocardial infarction) [facility])    Rx / [place] Orders ED Discharge Orders     None         Charlesetta Shanks, MD 04/13/22 1640

## 2022-04-13 NOTE — ED Triage Notes (Signed)
Patient c/o chest pain that radiates to left arm along with pain between shoulder blades while hiking today.

## 2022-04-13 NOTE — Progress Notes (Signed)
ANTICOAGULATION CONSULT NOTE  Pharmacy Consult for Heparin Indication: chest pain/ACS  Allergies  Allergen Reactions   Penicillins Other (See Comments)    Childhood allergy    Patient Measurements: Height: '5\' 9"'$  (175.3 cm) Weight: 90.7 kg (200 lb) IBW/kg (Calculated) : 70.7 Heparin Dosing Weight: 89.1 kg   Vital Signs: Temp: 97.8 F (36.6 C) (09/24 2043) Temp Source: Oral (09/24 2043) BP: 114/72 (09/24 2043) Pulse Rate: 69 (09/24 2043)  Labs: Recent Labs    04/13/22 1456 04/13/22 1650 04/13/22 2214  HGB 14.9  --   --   HCT 43.9  --   --   PLT 290  --   --   LABPROT 12.7  --   --   INR 1.0  --   --   HEPARINUNFRC  --   --  0.13*  CREATININE 1.01  --   --   TROPONINIHS 160* 286* 608*     Estimated Creatinine Clearance: 82.2 mL/min (by C-G formula based on SCr of 1.01 mg/dL).   Medical History: Past Medical History:  Diagnosis Date   Melanoma ([place]) 04/07/2019   right fore arm MIS TX EXC   SCC (squamous cell carcinoma) 06/30/2016   RIGHT DORSAL HAND CX3 5FU   SCC (squamous cell carcinoma) 06/30/2016   LEFT HAND TX CX3 5FU   Squamous cell carcinoma of skin 12/2015   RIGHT TEMPLE CX3 5FU    Medications:  Medications Prior to Admission  Medication Sig Dispense Refill Last Dose   ascorbic acid (VITAMIN C) 500 MG tablet Take 500 mg by mouth daily.   04/13/2022   Cholecalciferol (VITAMIN D3) 50 MCG (2000 UT) TABS Take 1 tablet by mouth daily.   04/13/2022   fluorouracil (EFUDEX) 5 % cream APPLY TO AFFECTED AREA(S) AT BEDTIME AS DIRECTED (Patient taking differently: Apply 1 Application topically daily as needed (for rash).) 40 g 1 unknown   Multiple Vitamin (MULITIVITAMIN WITH MINERALS) TABS Take 1 tablet by mouth daily.   04/12/2022   omega-3 acid ethyl esters (LOVAZA) 1 g capsule Take 1 g by mouth 2 (two) times daily.   04/13/2022   saw palmetto 500 MG capsule Take 500 mg by mouth in the morning, at noon, in the evening, and at bedtime.   04/13/2022   Turmeric (QC  TUMERIC COMPLEX) 500 MG CAPS Take 1 capsule by mouth daily.   04/13/2022   zinc gluconate 50 MG tablet Take 50 mg by mouth daily.   04/13/2022   Fluorouracil (TOLAK) 4 % CREA Apply 1 application topically daily. (Patient not taking: Reported on 01/28/2022) 40 g 1 Not Taking   Fluorouracil (TOLAK) 4 % CREA Apply 1 application topically daily. (Patient not taking: Reported on 04/13/2022) 40 g 1 Not Taking   Halobetasol Prop-Tazarotene (DUOBRII) 0.01-0.045 % LOTN Apply 1 application topically daily. (Patient not taking: Reported on 01/28/2021) 4 g 0 Not Taking   terbinafine (LAMISIL) 250 MG tablet Take 1 tablet (250 mg total) by mouth daily. (Patient not taking: Reported on 01/28/2021) 30 tablet 3 Not Taking     Assessment: 48 yom presenting with chest pain. Heparin per pharmacy consult placed for chest pain/ACS.  Patient is not on anticoagulation prior to arrival.  Hgb 14.9; plt 290  9/24 PM update:  Heparin level low  Goal of Therapy:  Heparin level 0.3-0.7 units/ml Monitor platelets by anticoagulation protocol: Yes   Plan:  Give IV heparin 2000 units bolus x 1 Inc heparin infusion to 1400 units/hr Check anti-Xa level in 8  hours and daily while on heparin Continue to monitor H&H and platelets  Narda Bonds, PharmD, BCPS Clinical Pharmacist Phone: [phone]

## 2022-04-13 NOTE — ED Notes (Signed)
Called report to floor nurse at [facility] states that she is unavailable. Charge nurse called back [phone]  and got report

## 2022-04-13 NOTE — Progress Notes (Signed)
ANTICOAGULATION CONSULT NOTE - Initial Consult  Pharmacy Consult for Heparin Indication: chest pain/ACS  Allergies  Allergen Reactions   Penicillins Other (See Comments)    Childhood allergy    Patient Measurements: Height: '5\' 9"'$  (175.3 cm) Weight: 90.7 kg (200 lb) IBW/kg (Calculated) : 70.7 Heparin Dosing Weight: 89.1 kg   Vital Signs: Temp: 98.3 F (36.8 C) (09/24 1419) Temp Source: Oral (09/24 1419) BP: 107/70 (09/24 1600) Pulse Rate: 74 (09/24 1600)  Labs: Recent Labs    04/13/22 1456  HGB 14.9  HCT 43.9  PLT 290  CREATININE 1.01  TROPONINIHS 160*    Estimated Creatinine Clearance: 82.2 mL/min (by C-G formula based on SCr of 1.01 mg/dL).   Medical History: Past Medical History:  Diagnosis Date   Melanoma ([place]) 04/07/2019   right fore arm MIS TX EXC   SCC (squamous cell carcinoma) 06/30/2016   RIGHT DORSAL HAND CX3 5FU   SCC (squamous cell carcinoma) 06/30/2016   LEFT HAND TX CX3 5FU   Squamous cell carcinoma of skin 12/2015   RIGHT TEMPLE CX3 5FU    Medications:  (Not in a hospital admission)  Scheduled:  Infusions:  PRN:   Assessment: 38 yom presenting with chest pain. Heparin per pharmacy consult placed for chest pain/ACS.  Patient is not on anticoagulation prior to arrival.  Hgb 14.9; plt 290  Goal of Therapy:  Heparin level 0.3-0.7 units/ml Monitor platelets by anticoagulation protocol: Yes   Plan:  Give IV heparin 4000 units bolus x 1 Start heparin infusion at 1200 units/hr Check anti-Xa level in 6 hours and daily while on heparin Continue to monitor H&H and platelets  Lorelei Pont, PharmD, BCPS 04/13/2022 4:12 PM ED Clinical Pharmacist -  [phone]

## 2022-04-13 NOTE — Progress Notes (Signed)
Received call from [facility] Pt with typical new onset crescendo angina, with NSTEMI Will continue ASA and Heparin and arrange for transfer for cath in am We will begin beta-blockers and statins upon arrival

## 2022-04-13 NOTE — ED Notes (Signed)
Patient received heprin bolus

## 2022-04-13 NOTE — H&P (Signed)
Cardiology Admission History and Physical   Patient ID: Dustin Wood MRN: [id number]; DOB: 1956-01-19   Admission date: 04/13/2022  PCP:  Susy Frizzle, MD   [facility] Providers Cardiologist:  None        Chief Complaint:  Chest pain  Patient Profile:   Dustin Wood is a 66 y.o. male with a PMHx of psoriasis who is being seen 04/13/2022 for the evaluation of NSTEMI.  History of Present Illness:   Dustin Wood is a 66yo M with a PMHx of psoriasis who presented to the ED at [facility] complaining of chest pain which began around 12 pm today while hiking with his family. He notes that it was substernally located and radiated into his left shoulder and left arm. It was associated with nausea and diaphoresis. It was pulling in nature. It improved with rest. He was taken to OSH ED where hsTroponin 160 -> 286. ECG without ischemic ST changes. Pt was given full dose ASA and started on heparin gtt. He was transferred for further evaluation and treatment.   Upon arrival, pt admits to improvement of symptoms. He notes that he actually had 2 prior episodes of chest pain while working in his barn doing physical labor; one episode yesterday evening and one this morning prior to hiking. They were alleviated with rest. He is eating and is tolerating that well. He denies any RF for CAD other than autoimmune disorder (psoriasis). He has no family hx of CAD and is a never smoker. Glu was elevated to 167 at OSH. A1C pending. There are no other concerns at this time.     Past Medical History:  Diagnosis Date   Melanoma ([place]) 04/07/2019   right fore arm MIS TX EXC   SCC (squamous cell carcinoma) 06/30/2016   RIGHT DORSAL HAND CX3 5FU   SCC (squamous cell carcinoma) 06/30/2016   LEFT HAND TX CX3 5FU   Squamous cell carcinoma of skin 12/2015   RIGHT TEMPLE CX3 5FU    Past Surgical History:  Procedure Laterality Date   CHOLECYSTECTOMY     HERNIA REPAIR     MELANOMA EXCISION        Medications Prior to Admission: Prior to Admission medications   Medication Sig Start Date End Date Taking? Authorizing Provider  ascorbic acid (VITAMIN C) 500 MG tablet Take 500 mg by mouth daily.   Yes [provider]  Cholecalciferol (VITAMIN D3) 50 MCG (2000 UT) TABS Take 1 tablet by mouth daily.   Yes [provider]  fluorouracil (EFUDEX) 5 % cream APPLY TO AFFECTED AREA(S) AT BEDTIME AS DIRECTED Patient taking differently: Apply 1 Application topically daily as needed (for rash). 03/08/21  Yes Susy Frizzle, MD  Multiple Vitamin (MULITIVITAMIN WITH MINERALS) TABS Take 1 tablet by mouth daily.   Yes [provider]  omega-3 acid ethyl esters (LOVAZA) 1 g capsule Take 1 g by mouth 2 (two) times daily.   Yes [provider]  saw palmetto 500 MG capsule Take 500 mg by mouth in the morning, at noon, in the evening, and at bedtime.   Yes [provider]  Turmeric (QC TUMERIC COMPLEX) 500 MG CAPS Take 1 capsule by mouth daily.   Yes [provider]  zinc gluconate 50 MG tablet Take 50 mg by mouth daily.   Yes [provider]  Fluorouracil (TOLAK) 4 % CREA Apply 1 application topically daily. Patient not taking: Reported on 01/28/2022 01/28/21  Lavonna Monarch, MD  Fluorouracil (TOLAK) 4 % CREA Apply 1 application topically daily. Patient not taking: Reported on 04/13/2022 01/28/21   Lavonna Monarch, MD  Halobetasol Prop-Tazarotene (DUOBRII) 0.01-0.045 % LOTN Apply 1 application topically daily. Patient not taking: Reported on 01/28/2021 04/02/20   Clark-Burning, Anderson Malta, PA-C  terbinafine (LAMISIL) 250 MG tablet Take 1 tablet (250 mg total) by mouth daily. Patient not taking: Reported on 01/28/2021 01/25/21   Susy Frizzle, MD     Allergies:    Allergies  Allergen Reactions   Penicillins Other (See Comments)    Childhood allergy    Social History:   Social History   Socioeconomic History   Marital status: Single     Spouse name: Not on file   Number of children: Not on file   Years of education: Not on file   Highest education level: Not on file  Occupational History   Not on file  Tobacco Use   Smoking status: Never   Smokeless tobacco: Never  Vaping Use   Vaping Use: Never used  Substance and Sexual Activity   Alcohol use: Yes    Comment: rare   Drug use: No   Sexual activity: Not on file  Other Topics Concern   Not on file  Social History Narrative   Not on file   Social Determinants of Health   Financial Resource Strain: Not on file  Food Insecurity: Not on file  Transportation Needs: Not on file  Physical Activity: Not on file  Stress: Not on file  Social Connections: Not on file  Intimate Partner Violence: Not on file    Family History:   No pertinent Family hx. Negative for CAD.   ROS:  Please see the history of present illness. 14 point ROS performed.  All other ROS reviewed and negative.     Physical Exam/Data:   Vitals:   04/13/22 1724 04/13/22 1730 04/13/22 1830 04/13/22 2043  BP:  122/79 119/75 114/72  Pulse:  69 69 69  Resp:  (!) '21 18 18  '$ Temp: 97.6 F (36.4 C)  97.7 F (36.5 C) 97.8 F (36.6 C)  TempSrc: Oral  Oral Oral  SpO2:  96% 99% 94%  Weight:      Height:   '5\' 9"'$  (1.753 m)     Intake/Output Summary (Last 24 hours) at 04/13/2022 2113 Last data filed at 04/13/2022 1800 Gross per 24 hour  Intake 240 ml  Output --  Net 240 ml      04/13/2022    2:19 PM 03/27/2022    3:38 PM 06/29/2020    3:34 PM  Last 3 Weights  Weight (lbs) 200 lb 200 lb 198 lb  Weight (kg) 90.719 kg 90.719 kg 89.812 kg     Body mass index is 29.53 kg/m.  General:  Well nourished, well developed, in no acute distress HEENT: normal Neck: no JVD Vascular: No carotid bruits; Distal pulses 2+ bilaterally   Cardiac:  normal S1, S2; RRR; no murmur  Lungs:  clear to auscultation bilaterally, no wheezing, rhonchi or rales  Abd: soft, nontender, no hepatomegaly  Ext: no  edema Musculoskeletal:  No deformities, BUE and BLE strength normal and equal Skin: warm and dry  Neuro:  CNs 2-12 intact, no focal abnormalities noted Psych:  Normal affect    EKG:  The ECG that was done  was personally reviewed and demonstrates NSR; normal axis; no ischemic ST changes   Relevant CV Studies: None other than ECG above  Laboratory Data:  High Sensitivity Troponin:   Recent Labs  Lab 04/13/22 1456 04/13/22 1650  TROPONINIHS 160* 286*      Chemistry Recent Labs  Lab 04/13/22 1456  NA 136  K 3.8  CL 105  CO2 24  GLUCOSE 167*  BUN 17  CREATININE 1.01  CALCIUM 8.5*  GFRNONAA >60  ANIONGAP 7    No results for input(s): "PROT", "ALBUMIN", "AST", "ALT", "ALKPHOS", "BILITOT" in the last 168 hours. Lipids No results for input(s): "CHOL", "TRIG", "HDL", "LABVLDL", "LDLCALC", "CHOLHDL" in the last 168 hours. Hematology Recent Labs  Lab 04/13/22 1456  WBC 9.6  RBC 5.39  HGB 14.9  HCT 43.9  MCV 81.4  MCH 27.6  MCHC 33.9  RDW 13.3  PLT 290   Thyroid No results for input(s): "TSH", "FREET4" in the last 168 hours. BNPNo results for input(s): "BNP", "PROBNP" in the last 168 hours.  DDimer No results for input(s): "DDIMER" in the last 168 hours.   Radiology/Studies:  DG Chest 2 View  Result Date: 04/13/2022 CLINICAL DATA:  Chest pain. EXAM: CHEST - 2 VIEW COMPARISON:  None Available. FINDINGS: The heart size and mediastinal contours are within normal limits. Both lungs are clear. The visualized skeletal structures are unremarkable. IMPRESSION: No active cardiopulmonary disease. Electronically Signed   By: Dorise Bullion III M.D.   On: 04/13/2022 14:48     Assessment and Plan:   NSTEMI - pt presented to the ED with typical chest pain symptoms - hsTroponin 160 -> 286 - ECG without ischemic ST changes - Cardiac RF: autoimmune (psoriasis); possibly DM (Glu 167 on BMP on arrival) - A1C and Lipid Panel pending for further risk stratification  - s/p ASA  '325mg'$  po x 1 at OSH - will start '81mg'$  po daily in AM - Heparin gtt started at OSH - will continue at this time - will trend Troponin and ECG overnight  - pt given explicit instruction to call if chest pain recurs - plan for coronary angiographic eval in am - ECHO ordered for tomorrow - NPO now     Risk Assessment/Risk Scores:    TIMI Risk Score for Unstable Angina or Non-ST Elevation MI:   The patient's TIMI risk score is  , which indicates a  % risk of all cause mortality, new or recurrent myocardial infarction or need for urgent revascularization in the next 14 days.{ TIMI 2        Severity of Illness: The appropriate patient status for this patient is INPATIENT. Inpatient status is judged to be reasonable and necessary in order to provide the required intensity of service to ensure the patient's safety. The patient's presenting symptoms, physical exam findings, and initial radiographic and laboratory data in the context of their chronic comorbidities is felt to place them at high risk for further clinical deterioration. Furthermore, it is not anticipated that the patient will be medically stable for discharge from the hospital within 2 midnights of admission.   * I certify that at the point of admission it is my clinical judgment that the patient will require inpatient hospital care spanning beyond 2 midnights from the point of admission due to high intensity of service, high risk for further deterioration and high frequency of surveillance required.*   For questions or updates, please contact [place] Please consult [email or web address] for contact info under     Signed, Temple Pacini, MD  04/13/2022 9:13 PM

## 2022-04-14 ENCOUNTER — Encounter (HOSPITAL_COMMUNITY)
Admission: EM | Disposition: A | Discharge status: Home / Self Care | Payer: Self-pay | Source: Home / Self Care | Attending: Thoracic Surgery (Cardiothoracic Vascular Surgery)

## 2022-04-14 ENCOUNTER — Inpatient Hospital Stay (HOSPITAL_COMMUNITY): Payer: BC Managed Care – PPO

## 2022-04-14 DIAGNOSIS — I10 Essential (primary) hypertension: Secondary | ICD-10-CM | POA: Diagnosis not present

## 2022-04-14 DIAGNOSIS — E781 Pure hyperglyceridemia: Secondary | ICD-10-CM

## 2022-04-14 DIAGNOSIS — I214 Non-ST elevation (NSTEMI) myocardial infarction: Secondary | ICD-10-CM

## 2022-04-14 DIAGNOSIS — E1169 Type 2 diabetes mellitus with other specified complication: Secondary | ICD-10-CM

## 2022-04-14 DIAGNOSIS — I2510 Atherosclerotic heart disease of native coronary artery without angina pectoris: Secondary | ICD-10-CM

## 2022-04-14 DIAGNOSIS — E785 Hyperlipidemia, unspecified: Secondary | ICD-10-CM

## 2022-04-14 HISTORY — DX: Atherosclerotic heart disease of native coronary artery without angina pectoris: I25.10

## 2022-04-14 HISTORY — PX: LEFT HEART CATH AND CORONARY ANGIOGRAPHY: CATH118249

## 2022-04-14 HISTORY — PX: TRANSTHORACIC ECHOCARDIOGRAM: SHX275

## 2022-04-14 LAB — COMPREHENSIVE METABOLIC PANEL
ALT: 30 U/L (ref 0–44)
AST: 25 U/L (ref 15–41)
Albumin: 3.3 g/dL — ABNORMAL LOW (ref 3.5–5.0)
Alkaline Phosphatase: 48 U/L (ref 38–126)
Anion gap: 11 (ref 5–15)
BUN: 16 mg/dL (ref 8–23)
CO2: 25 mmol/L (ref 22–32)
Calcium: 9 mg/dL (ref 8.9–10.3)
Chloride: 102 mmol/L (ref 98–111)
Creatinine, Ser: 1.22 mg/dL (ref 0.61–1.24)
GFR, Estimated: >60 mL/min (ref >60)
Glucose, Bld: 137 mg/dL — ABNORMAL HIGH (ref 70–99)
Potassium: 4 mmol/L (ref 3.5–5.1)
Sodium: 138 mmol/L (ref 135–145)
Total Bilirubin: 0.6 mg/dL (ref 0.3–1.2)
Total Protein: 6.5 g/dL (ref 6.5–8.1)

## 2022-04-14 LAB — CBC WITH DIFFERENTIAL/PLATELET
Abs Immature Granulocytes: 0.03 10*3/uL (ref 0.00–0.07)
Basophils Absolute: 0 10*3/uL (ref 0.0–0.1)
Basophils Relative: 0 %
Eosinophils Absolute: 0.4 10*3/uL (ref 0.0–0.5)
Eosinophils Relative: 5 %
HCT: 42.6 % (ref 39.0–52.0)
Hemoglobin: 14.5 g/dL (ref 13.0–17.0)
Immature Granulocytes: 0 %
Lymphocytes Relative: 30 %
Lymphs Abs: 2.6 10*3/uL (ref 0.7–4.0)
MCH: 28.2 pg (ref 26.0–34.0)
MCHC: 34 g/dL (ref 30.0–36.0)
MCV: 82.9 fL (ref 80.0–100.0)
Monocytes Absolute: 0.8 10*3/uL (ref 0.1–1.0)
Monocytes Relative: 9 %
Neutro Abs: 4.9 10*3/uL (ref 1.7–7.7)
Neutrophils Relative %: 56 %
Platelets: 257 10*3/uL (ref 150–400)
RBC: 5.14 MIL/uL (ref 4.22–5.81)
RDW: 13.5 % (ref 11.5–15.5)
WBC: 8.7 10*3/uL (ref 4.0–10.5)
nRBC: 0 % (ref 0.0–0.2)

## 2022-04-14 LAB — LIPID PANEL
Cholesterol: 173 mg/dL (ref 0–200)
HDL: 26 mg/dL — ABNORMAL LOW (ref >40)
LDL Cholesterol: 77 mg/dL (ref 0–99)
Total CHOL/HDL Ratio: 6.7 RATIO
Triglycerides: 349 mg/dL — ABNORMAL HIGH (ref <150)
VLDL: 70 mg/dL — ABNORMAL HIGH (ref 0–40)

## 2022-04-14 LAB — TROPONIN I (HIGH SENSITIVITY): Troponin I (High Sensitivity): 612 ng/L (ref <18)

## 2022-04-14 LAB — HEMOGLOBIN A1C
Hgb A1c MFr Bld: 7.5 % — ABNORMAL HIGH (ref 4.8–5.6)
Mean Plasma Glucose: 168.55 mg/dL

## 2022-04-14 LAB — GLUCOSE, CAPILLARY
Glucose-Capillary: 108 mg/dL — ABNORMAL HIGH (ref 70–99)
Glucose-Capillary: 102 mg/dL — ABNORMAL HIGH (ref 70–99)
Glucose-Capillary: 124 mg/dL — ABNORMAL HIGH (ref 70–99)

## 2022-04-14 LAB — ECHOCARDIOGRAM COMPLETE
Area-P 1/2: 3.48 cm2
Height: 69 in
S' Lateral: 3.1 cm
Weight: 3199.99 oz

## 2022-04-14 LAB — HEPARIN LEVEL (UNFRACTIONATED): Heparin Unfractionated: 0.29 IU/mL — ABNORMAL LOW (ref 0.30–0.70)

## 2022-04-14 LAB — HIV ANTIBODY (ROUTINE TESTING W REFLEX): HIV Screen 4th Generation wRfx: NONREACTIVE

## 2022-04-14 LAB — MAGNESIUM: Magnesium: 2.2 mg/dL (ref 1.7–2.4)

## 2022-04-14 SURGERY — LEFT HEART CATH AND CORONARY ANGIOGRAPHY
Anesthesia: LOCAL

## 2022-04-14 MED ORDER — LIDOCAINE HCL (PF) 1 % IJ SOLN
INTRAMUSCULAR | Status: AC
  Filled 2022-04-14: qty 30

## 2022-04-14 MED ORDER — ASPIRIN 81 MG PO CHEW: 81.0000 mg | CHEWABLE_TABLET | Freq: Every day | ORAL | Status: DC

## 2022-04-14 MED ORDER — HEPARIN SODIUM (PORCINE) 1000 UNIT/ML IJ SOLN
INTRAMUSCULAR | Status: DC | PRN
  Administered 2022-04-14: 4500 [IU] via INTRAVENOUS

## 2022-04-14 MED ORDER — ASPIRIN 81 MG PO CHEW: 81.0000 mg | CHEWABLE_TABLET | ORAL | Status: DC

## 2022-04-14 MED ORDER — LABETALOL HCL 5 MG/ML IV SOLN: 10.0000 mg | INTRAVENOUS | Status: AC | PRN

## 2022-04-14 MED ORDER — SODIUM CHLORIDE 0.9% FLUSH
3.0000 mL | Freq: Two times a day (BID) | INTRAVENOUS | Status: DC
  Administered 2022-04-14 – 2022-04-15 (×3): 3 mL via INTRAVENOUS

## 2022-04-14 MED ORDER — ATORVASTATIN CALCIUM 80 MG PO TABS: 80.0000 mg | ORAL_TABLET | Freq: Every day | ORAL | Status: DC

## 2022-04-14 MED ORDER — FENTANYL CITRATE (PF) 100 MCG/2ML IJ SOLN
INTRAMUSCULAR | Status: DC | PRN
  Administered 2022-04-14: 25 ug via INTRAVENOUS

## 2022-04-14 MED ORDER — MIDAZOLAM HCL 2 MG/2ML IJ SOLN
INTRAMUSCULAR | Status: AC
  Filled 2022-04-14: qty 2

## 2022-04-14 MED ORDER — HEPARIN (PORCINE) IN NACL 1000-0.9 UT/500ML-% IV SOLN
INTRAVENOUS | Status: AC
  Filled 2022-04-14: qty 1000

## 2022-04-14 MED ORDER — SODIUM CHLORIDE 0.9 % WEIGHT BASED INFUSION
1.0000 mL/kg/h | INTRAVENOUS | Status: DC
  Administered 2022-04-14: 1 mL/kg/h via INTRAVENOUS

## 2022-04-14 MED ORDER — SODIUM CHLORIDE 0.9% FLUSH
3.0000 mL | Freq: Two times a day (BID) | INTRAVENOUS | Status: DC
  Administered 2022-04-14 – 2022-04-15 (×2): 3 mL via INTRAVENOUS

## 2022-04-14 MED ORDER — FENTANYL CITRATE (PF) 100 MCG/2ML IJ SOLN
INTRAMUSCULAR | Status: AC
  Filled 2022-04-14: qty 2

## 2022-04-14 MED ORDER — INSULIN ASPART 100 UNIT/ML IJ SOLN
0.0000 [IU] | Freq: Three times a day (TID) | INTRAMUSCULAR | Status: DC
  Administered 2022-04-15: 1 [IU] via SUBCUTANEOUS
  Administered 2022-04-15: 3 [IU] via SUBCUTANEOUS
  Administered 2022-04-16 (×2): 2 [IU] via SUBCUTANEOUS

## 2022-04-14 MED ORDER — METOPROLOL TARTRATE 12.5 MG HALF TABLET
12.5000 mg | ORAL_TABLET | Freq: Two times a day (BID) | ORAL | Status: DC
  Administered 2022-04-14 – 2022-04-16 (×6): 12.5 mg via ORAL
  Filled 2022-04-14 (×6): qty 1

## 2022-04-14 MED ORDER — SODIUM CHLORIDE 0.9 % IV SOLN: 250.0000 mL | INTRAVENOUS | Status: DC | PRN

## 2022-04-14 MED ORDER — LIDOCAINE HCL (PF) 1 % IJ SOLN
INTRAMUSCULAR | Status: DC | PRN
  Administered 2022-04-14: 2 mL

## 2022-04-14 MED ORDER — ACETAMINOPHEN 325 MG PO TABS: 650.0000 mg | ORAL_TABLET | ORAL | Status: DC | PRN

## 2022-04-14 MED ORDER — SODIUM CHLORIDE 0.9 % WEIGHT BASED INFUSION
3.0000 mL/kg/h | INTRAVENOUS | Status: DC
  Administered 2022-04-14: 3 mL/kg/h via INTRAVENOUS

## 2022-04-14 MED ORDER — SODIUM CHLORIDE 0.9 % IV SOLN: INTRAVENOUS | Status: AC

## 2022-04-14 MED ORDER — VERAPAMIL HCL 2.5 MG/ML IV SOLN
INTRAVENOUS | Status: AC
  Filled 2022-04-14: qty 2

## 2022-04-14 MED ORDER — IOHEXOL 350 MG/ML SOLN
INTRAVENOUS | Status: DC | PRN
  Administered 2022-04-14: 50 mL

## 2022-04-14 MED ORDER — SODIUM CHLORIDE 0.9% FLUSH: 3.0000 mL | INTRAVENOUS | Status: DC | PRN

## 2022-04-14 MED ORDER — HEPARIN SODIUM (PORCINE) 1000 UNIT/ML IJ SOLN
INTRAMUSCULAR | Status: AC
  Filled 2022-04-14: qty 10

## 2022-04-14 MED ORDER — INSULIN ASPART 100 UNIT/ML IJ SOLN: 0.0000 [IU] | Freq: Every day | INTRAMUSCULAR | Status: DC

## 2022-04-14 MED ORDER — SODIUM CHLORIDE 0.9 % WEIGHT BASED INFUSION: 1.0000 mL/kg/h | INTRAVENOUS | Status: DC

## 2022-04-14 MED ORDER — MORPHINE SULFATE (PF) 2 MG/ML IV SOLN: 2.0000 mg | INTRAVENOUS | Status: DC | PRN

## 2022-04-14 MED ORDER — MIDAZOLAM HCL 2 MG/2ML IJ SOLN
INTRAMUSCULAR | Status: DC | PRN
  Administered 2022-04-14: 1 mg via INTRAVENOUS

## 2022-04-14 MED ORDER — HYDRALAZINE HCL 20 MG/ML IJ SOLN: 10.0000 mg | INTRAMUSCULAR | Status: AC | PRN

## 2022-04-14 MED ORDER — ASPIRIN 81 MG PO CHEW
81.0000 mg | CHEWABLE_TABLET | ORAL | Status: AC
  Administered 2022-04-14: 81 mg via ORAL
  Filled 2022-04-14: qty 1

## 2022-04-14 MED ORDER — VERAPAMIL HCL 2.5 MG/ML IV SOLN
INTRA_ARTERIAL | Status: DC | PRN
  Administered 2022-04-14: 10 mL via INTRA_ARTERIAL

## 2022-04-14 MED ORDER — SODIUM CHLORIDE 0.9 % WEIGHT BASED INFUSION: 3.0000 mL/kg/h | INTRAVENOUS | Status: DC

## 2022-04-14 MED ORDER — HEPARIN (PORCINE) 25000 UT/250ML-% IV SOLN
1900.0000 [IU]/h | INTRAVENOUS | Status: DC
  Administered 2022-04-14: 1400 [IU]/h via INTRAVENOUS
  Administered 2022-04-15: 1550 [IU]/h via INTRAVENOUS
  Administered 2022-04-15: 2000 [IU]/h via INTRAVENOUS
  Administered 2022-04-16: 1900 [IU]/h via INTRAVENOUS
  Filled 2022-04-14 (×5): qty 250

## 2022-04-14 MED ORDER — HEPARIN (PORCINE) IN NACL 1000-0.9 UT/500ML-% IV SOLN
INTRAVENOUS | Status: DC | PRN
  Administered 2022-04-14 (×2): 500 mL

## 2022-04-14 MED ORDER — ROSUVASTATIN CALCIUM 20 MG PO TABS
40.0000 mg | ORAL_TABLET | Freq: Every day | ORAL | Status: DC
  Administered 2022-04-14 – 2022-04-21 (×7): 40 mg via ORAL
  Filled 2022-04-14 (×8): qty 2

## 2022-04-14 MED ORDER — ONDANSETRON HCL 4 MG/2ML IJ SOLN: 4.0000 mg | Freq: Four times a day (QID) | INTRAMUSCULAR | Status: DC | PRN

## 2022-04-14 MED ORDER — NITROGLYCERIN 1 MG/10 ML FOR IR/CATH LAB
INTRA_ARTERIAL | Status: AC
  Filled 2022-04-14: qty 10

## 2022-04-14 MED ORDER — LIVING WELL WITH DIABETES BOOK
Freq: Once | Status: AC
  Filled 2022-04-14: qty 1

## 2022-04-14 SURGICAL SUPPLY — 11 items
BAND ZEPHYR COMPRESS 30 LONG (HEMOSTASIS) IMPLANT
CATH INFINITI JR4 5F (CATHETERS) IMPLANT
CATH OPTITORQUE TIG 4.0 5F (CATHETERS) IMPLANT
GLIDESHEATH SLEND A-KIT 6F 22G (SHEATH) IMPLANT
GUIDEWIRE INQWIRE 1.5J.035X260 (WIRE) IMPLANT
INQWIRE 1.5J .035X260CM (WIRE) ×1
KIT HEART LEFT (KITS) ×1 IMPLANT
PACK CARDIAC CATHETERIZATION (CUSTOM PROCEDURE TRAY) ×1 IMPLANT
TRANSDUCER W/STOPCOCK (MISCELLANEOUS) ×1 IMPLANT
TUBING CIL FLEX 10 FLL-RA (TUBING) ×1 IMPLANT
WIRE HI TORQ VERSACORE-J 145CM (WIRE) IMPLANT

## 2022-04-14 NOTE — Progress Notes (Signed)
Mobility Specialist - Progress Note   04/14/22 1040  Mobility  Activity Ambulated with assistance in hallway  Level of Assistance Standby assist, set-up cues, supervision of patient - no hands on  Assistive Device None  Distance Ambulated (ft) 400 ft  Activity Response Tolerated well  $Mobility charge 1 Mobility   Received pt in bed and agreeable to mobility. Pt was asymptomatic throughout ambulation. Pt was left in bed with all needs met.   Larey Seat

## 2022-04-14 NOTE — Interval H&P Note (Signed)
Cath Lab Visit (complete for each Cath Lab visit)  Clinical Evaluation Leading to the Procedure:   ACS: Yes.    Non-ACS:    Anginal Classification: CCS II  Anti-ischemic medical therapy: No Therapy  Non-Invasive Test Results: No non-invasive testing performed  Prior CABG: No previous CABG      History and Physical Interval Note:  04/14/2022 3:43 PM  [place] Sink  has presented today for surgery, with the diagnosis of nstemi.  The various methods of treatment have been discussed with the patient and family. After consideration of risks, benefits and other options for treatment, the patient has consented to  Procedure(s): LEFT HEART CATH AND CORONARY ANGIOGRAPHY (N/A) as a surgical intervention.  The patient's history has been reviewed, patient examined, no change in status, stable for surgery.  I have reviewed the patient's chart and labs.  Questions were answered to the patient's satisfaction.     Quay Burow

## 2022-04-14 NOTE — Inpatient Diabetes Management (Addendum)
Inpatient Diabetes Program Recommendations  AACE/ADA: New Consensus Statement on Inpatient Glycemic Control (2015)  Target Ranges:  Prepandial:   less than 140 mg/dL      Peak postprandial:   less than 180 mg/dL (1-2 hours)      Critically ill patients:  140 - 180 mg/dL    Latest Reference Range & Units 04/13/22 14:56 04/14/22 02:20  Glucose 70 - 99 mg/dL 167 (H) 137 (H)  (H): Data is abnormally high  Latest Reference Range & Units 04/14/22 02:20  Hemoglobin A1C 4.8 - 5.6 % 7.5 (H)  168 mg/dl  (H): Data is abnormally high    Admit with: CP/ NSTEMI  New Diagnosis of Diabetes as evidenced by A1c of 7.5% and no prior diabetes diagnosis   MD- Please consider adding orders for CBG checks and Novolog Sensitive Correction Scale/ SSI (0-9 units) TID AC + HS  Will plan to speak with pt about new diabetes diagnosis today and order educational materials    Addendum 11:10am--Met w/ pt and wife at bedside to discuss new diagnosis diabetes.  Pt told me the MD had not mentioned anything about his glucose levels.  We discussed his lab glucose levels and current A1c of 7.5%--How an A1c of 6.5% or higher is diagnostic for diabetes per the American Association of Diabetes Standards of care.  Pt did not seem overly surprised--Told me he does not have diabetes in his family but has poor eating habits--wife had GDM and wife has many family members with diabets so she has some familiarity with diabetes.  We talked about how his cardiac issues (high TG level) may be contributing his glucose levels.  Discussed very basic concepts of diabetes care with pt and his wife and how diabetes can be managed with nutrition changes, exercise, and medications.  Unsure at this point if pt will be sent home on meds--we did discuss SGLT-2 meds and how they can help with both cardiac health and glucose levels.  Have ordered Living well with diabetes book, RD consult for diet edu, and CBG meter teaching.  Discussed with pt that  I have asked the MD to start CBG checks and Novolog SSi in hospital so we can follow glucose levels and trends.  Asked pt and his wife to start reading thru the LWWD book when they receive it and to rite down any questions they may have as I will plan to visit pt and wife again tomorrow 9/26 for further review.    --Will follow patient during hospitalization--  Wyn Quaker RN, MSN, [place] Diabetes Coordinator Inpatient Glycemic Control Team Team Pager: [phone] (8a-5p)

## 2022-04-14 NOTE — Progress Notes (Addendum)
Rounding Note    Patient Name: Dustin Wood Date of Encounter: 04/14/2022  [place] Cardiologist: None   Subjective   No chest pain this morning. Echo currently at bedside.   Inpatient Medications    Scheduled Meds:  aspirin EC  81 mg Oral Daily   Continuous Infusions:  heparin 1,400 Units/hr (04/13/22 2345)   PRN Meds: acetaminophen, nitroGLYCERIN, ondansetron (ZOFRAN) IV   Vital Signs    Vitals:   04/13/22 1830 04/13/22 2043 04/13/22 2331 04/14/22 0518  BP: 119/75 114/72 112/66 109/67  Pulse: 69 69 69 62  Resp: '18 18 18 18  '$ Temp: 97.7 F (36.5 C) 97.8 F (36.6 C) 98.2 F (36.8 C) 98 F (36.7 C)  TempSrc: Oral Oral Oral Oral  SpO2: 99% 94% 90% 96%  Weight:      Height: '5\' 9"'$  (1.753 m)       Intake/Output Summary (Last 24 hours) at 04/14/2022 0816 Last data filed at 04/14/2022 0128 Gross per 24 hour  Intake 558.15 ml  Output --  Net 558.15 ml      04/13/2022    2:19 PM 03/27/2022    3:38 PM 06/29/2020    3:34 PM  Last 3 Weights  Weight (lbs) 200 lb 200 lb 198 lb  Weight (kg) 90.719 kg 90.719 kg 89.812 kg      Telemetry    Sinus Rhythm - Personally Reviewed  ECG    Sinus Rhythm, 63 bpm biphasic T wave in v1-v2 - Personally Reviewed  Physical Exam   GEN: No acute distress.   Neck: No JVD Cardiac: RRR, no murmurs, rubs, or gallops.  Respiratory: Clear to auscultation bilaterally. GI: Soft, nontender, non-distended  MS: No edema; No deformity. Neuro:  Nonfocal  Psych: Normal affect   Labs    High Sensitivity Troponin:   Recent Labs  Lab 04/13/22 1456 04/13/22 1650 04/13/22 2214 04/14/22 0220  TROPONINIHS 160* 286* 608* 612*     Chemistry Recent Labs  Lab 04/13/22 1456 04/14/22 0220  NA 136 138  K 3.8 4.0  CL 105 102  CO2 24 25  GLUCOSE 167* 137*  BUN 17 16  CREATININE 1.01 1.22  CALCIUM 8.5* 9.0  MG  --  2.2  PROT  --  6.5  ALBUMIN  --  3.3*  AST  --  25  ALT  --  30  ALKPHOS  --  48  BILITOT  --  0.6   GFRNONAA >60 >60  ANIONGAP 7 11    Lipids  Recent Labs  Lab 04/14/22 0220  CHOL 173  TRIG 349*  HDL 26*  LDLCALC 77  CHOLHDL 6.7    Hematology Recent Labs  Lab 04/13/22 1456 04/14/22 0220  WBC 9.6 8.7  RBC 5.39 5.14  HGB 14.9 14.5  HCT 43.9 42.6  MCV 81.4 82.9  MCH 27.6 28.2  MCHC 33.9 34.0  RDW 13.3 13.5  PLT 290 257   Thyroid No results for input(s): "TSH", "FREET4" in the last 168 hours.  BNPNo results for input(s): "BNP", "PROBNP" in the last 168 hours.  DDimer No results for input(s): "DDIMER" in the last 168 hours.   Radiology    DG Chest 2 View  Result Date: 04/13/2022 CLINICAL DATA:  Chest pain. EXAM: CHEST - 2 VIEW COMPARISON:  None Available. FINDINGS: The heart size and mediastinal contours are within normal limits. Both lungs are clear. The visualized skeletal structures are unremarkable. IMPRESSION: No active cardiopulmonary disease. Electronically Signed   By:  Dorise Bullion III M.D.   On: 04/13/2022 14:48    Cardiac Studies   EKG: Sinus rhythm, 63 bpm.  With ST and T wave changes concerning for anterior ischemia.  Echo: pending EF 50 to 55%.  Low normal function.  Mild HK of basal-mid inferior/inferolateral wall.  GR 1 DD.  Discordant septal motion.  RV.  Normal valves.  Mild aortic sclerosis without stenosis.  Patient Profile     66 y.o. male with PMH of psoriasis who presented with chest pain and found to have a NSTEMI.   Assessment & Plan    NSTEMI -- hsTn 160>>286>>612. I initial.  Initial EKG without ischemia.  However EKG today does suggest follow-up with ischemia; and there is regional wall motion abnormality noted on echo. -- continue IV heparin, ASA, Crestor '40mg'$  daily, add low dose metoprolol  -- echo reviewed -- planned for cardiac cath today  Shared Decision Making/Informed Consent The risks [stroke (1 in 1000), death (1 in 1000), kidney failure [usually temporary] (1 in 500), bleeding (1 in 200), allergic reaction [possibly  serious] (1 in 200)], benefits (diagnostic support and management of coronary artery disease) and alternatives of a cardiac catheterization were discussed in detail with Dustin Wood and he is willing to proceed.   Hypertriglyceridemia -- Trig 349, HLD 26, LDL 77 -- add Crestor '40mg'$  daily  DM -- Hgb A1c 7.5 -- add SSI -- further discuss medications post cath  For questions or updates, please contact [place] Please consult [email or web address] for contact info under        Signed, Dustin Bellis, NP  04/14/2022, 8:16 AM     ATTENDING ATTESTATION   ========================== I have seen, examined and evaluated the patient this AM along with Dustin Bellis, NP-C.  After reviewing all the available data and chart, we discussed the patients laboratory, study & physical findings as well as symptoms in detail.  I agree with her findings, examination as well as impression recommendations as per our discussion.    Attending adjustments noted in italics.  I personally reviewed the echocardiogram as noted above.  It does appear to be some regional wall motion normality consistent with possible inferior ischemia, however the EKG did suggest anterior retroseptal.  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  Plan cardiac catheterization today.  We will continue guideline directed medical therapy for for risk factor modification.    Leonie Man, MD, MS Glenetta Hew, M.D., M.S. Interventional Cardiologist  [place]  Pager # [phone] Phone # [phone] [address] [place], [place] [address]  `

## 2022-04-14 NOTE — Progress Notes (Addendum)
ANTICOAGULATION CONSULT NOTE  Pharmacy Consult for Heparin Indication: chest pain/ACS  Allergies  Allergen Reactions   Penicillins Other (See Comments)    Childhood allergy    Patient Measurements: Height: '5\' 9"'$  (175.3 cm) Weight: 90.7 kg (200 lb) IBW/kg (Calculated) : 70.7 Heparin Dosing Weight: 89.1 kg   Vital Signs: Temp: 98 F (36.7 C) (09/25 0900) Temp Source: Oral (09/25 0900) BP: 123/75 (09/25 0900) Pulse Rate: 65 (09/25 0945)  Labs: Recent Labs    04/13/22 1456 04/13/22 1650 04/13/22 2214 04/14/22 0220 04/14/22 0703  HGB 14.9  --   --  14.5  --   HCT 43.9  --   --  42.6  --   PLT 290  --   --  257  --   LABPROT 12.7  --   --   --   --   INR 1.0  --   --   --   --   HEPARINUNFRC  --   --  0.13*  --  0.29*  CREATININE 1.01  --   --  1.22  --   TROPONINIHS 160* 286* 608* 612*  --      Estimated Creatinine Clearance: 68.1 mL/min (by C-G formula based on SCr of 1.22 mg/dL).   Medical History: Past Medical History:  Diagnosis Date   Melanoma ([place]) 04/07/2019   right fore arm MIS TX EXC   SCC (squamous cell carcinoma) 06/30/2016   RIGHT DORSAL HAND CX3 5FU   SCC (squamous cell carcinoma) 06/30/2016   LEFT HAND TX CX3 5FU   Squamous cell carcinoma of skin 12/2015   RIGHT TEMPLE CX3 5FU    Medications:  Medications Prior to Admission  Medication Sig Dispense Refill Last Dose   ascorbic acid (VITAMIN C) 500 MG tablet Take 500 mg by mouth daily.   04/13/2022   Cholecalciferol (VITAMIN D3) 50 MCG (2000 UT) TABS Take 1 tablet by mouth daily.   04/13/2022   fluorouracil (EFUDEX) 5 % cream APPLY TO AFFECTED AREA(S) AT BEDTIME AS DIRECTED (Patient taking differently: Apply 1 Application topically daily as needed (for rash).) 40 g 1 unknown   Multiple Vitamin (MULITIVITAMIN WITH MINERALS) TABS Take 1 tablet by mouth daily.   04/12/2022   omega-3 acid ethyl esters (LOVAZA) 1 g capsule Take 1 g by mouth 2 (two) times daily.   04/13/2022   saw palmetto 500 MG  capsule Take 500 mg by mouth in the morning, at noon, in the evening, and at bedtime.   04/13/2022   Turmeric (QC TUMERIC COMPLEX) 500 MG CAPS Take 1 capsule by mouth daily.   04/13/2022   zinc gluconate 50 MG tablet Take 50 mg by mouth daily.   04/13/2022   terbinafine (LAMISIL) 250 MG tablet Take 1 tablet (250 mg total) by mouth daily. (Patient not taking: Reported on 01/28/2021) 30 tablet 3 Not Taking     Assessment: 89 yom presenting with chest pain. Heparin per pharmacy consult placed for chest pain/ACS. Patient is not on anticoagulation prior to arrival. Plans are for cardiac cath today -heparin level = 0.29 and slightly below goal on 1400 units/hr  Goal of Therapy:  Heparin level 0.3-0.7 units/ml Monitor platelets by anticoagulation protocol: Yes   Plan:  -No heparin changes now -Will follow plans post cath   Hildred Laser, PharmD Clinical Pharmacist **Pharmacist phone directory can now be found on [email or web address] ([email or web address]).  Listed under [place].

## 2022-04-14 NOTE — Progress Notes (Signed)
Nutrition Brief Note  RD consulted for new DM diet education. Contacted RN to determine when appropriate time for education would be. Pt is s/p cardiac cath this afternoon. Per RN pt just informed he will need CABG. Pt and wife are overwhelmed and requesting diet education be deferred until after surgery. Please re-consult RD when appropriate.  Candise Bowens, MS, RD, LDN, CNSC See AMiON for contact information

## 2022-04-14 NOTE — Progress Notes (Signed)
ANTICOAGULATION CONSULT NOTE- follow-up  Pharmacy Consult for Heparin Indication: chest pain/ACS  Allergies  Allergen Reactions   Penicillins Other (See Comments)    Childhood allergy    Patient Measurements: Height: '5\' 9"'$  (175.3 cm) Weight: 90.7 kg (200 lb) IBW/kg (Calculated) : 70.7 Heparin Dosing Weight: 89.1 kg   Vital Signs: Temp: 97.5 F (36.4 C) (09/25 1207) Temp Source: Oral (09/25 1207) BP: 106/71 (09/25 1619) Pulse Rate: 68 (09/25 1619)  Labs: Recent Labs    04/13/22 1456 04/13/22 1650 04/13/22 2214 04/14/22 0220 04/14/22 0703  HGB 14.9  --   --  14.5  --   HCT 43.9  --   --  42.6  --   PLT 290  --   --  257  --   LABPROT 12.7  --   --   --   --   INR 1.0  --   --   --   --   HEPARINUNFRC  --   --  0.13*  --  0.29*  CREATININE 1.01  --   --  1.22  --   TROPONINIHS 160* 286* 608* 612*  --      Estimated Creatinine Clearance: 68.1 mL/min (by C-G formula based on SCr of 1.22 mg/dL).   Medical History: Past Medical History:  Diagnosis Date   Melanoma ([place]) 04/07/2019   right fore arm MIS TX EXC   SCC (squamous cell carcinoma) 06/30/2016   RIGHT DORSAL HAND CX3 5FU   SCC (squamous cell carcinoma) 06/30/2016   LEFT HAND TX CX3 5FU   Squamous cell carcinoma of skin 12/2015   RIGHT TEMPLE CX3 5FU    Medications:  Medications Prior to Admission  Medication Sig Dispense Refill Last Dose   ascorbic acid (VITAMIN C) 500 MG tablet Take 500 mg by mouth daily.   04/13/2022   Cholecalciferol (VITAMIN D3) 50 MCG (2000 UT) TABS Take 1 tablet by mouth daily.   04/13/2022   fluorouracil (EFUDEX) 5 % cream APPLY TO AFFECTED AREA(S) AT BEDTIME AS DIRECTED (Patient taking differently: Apply 1 Application topically daily as needed (for rash).) 40 g 1 unknown   Multiple Vitamin (MULITIVITAMIN WITH MINERALS) TABS Take 1 tablet by mouth daily.   04/12/2022   omega-3 acid ethyl esters (LOVAZA) 1 g capsule Take 1 g by mouth 2 (two) times daily.   04/13/2022   saw palmetto  500 MG capsule Take 500 mg by mouth in the morning, at noon, in the evening, and at bedtime.   04/13/2022   Turmeric (QC TUMERIC COMPLEX) 500 MG CAPS Take 1 capsule by mouth daily.   04/13/2022   zinc gluconate 50 MG tablet Take 50 mg by mouth daily.   04/13/2022   terbinafine (LAMISIL) 250 MG tablet Take 1 tablet (250 mg total) by mouth daily. (Patient not taking: Reported on 01/28/2021) 30 tablet 3 Not Taking     Assessment: 70 yom presenting with chest pain. Heparin per pharmacy consult placed for chest pain/ACS. Patient is not on anticoagulation prior to arrival. Patient has returned to unit s/p Planned cardiac cath. Order received to restart the heparin infusion without a bolus 2 hours after the sheath has been removed. Removal was noted at 16:18  Will restart the heparin infusion at 18:30 -heparin level was 0.29 at 07:03 which is slightly below goal on 1400 units/hr  Goal of Therapy:  Heparin level 0.3-0.7 units/ml Monitor platelets by anticoagulation protocol: Yes   Plan:  Restart heparin infusion at 1400 units/hr and re-assess  a HL 6 hours later CBC and HL daily while on Heparin.   Vaughan Basta BS, PharmD, BCPS Clinical Pharmacist 04/14/2022 4:33 PM  Contact: [phone] after 3 PM  "Be curious, not judgmental..." -Jamal Maes

## 2022-04-14 NOTE — H&P (View-Only) (Signed)
Rounding Note    Patient Name: Dustin Wood Date of Encounter: 04/14/2022  [place] Cardiologist: None   Subjective   No chest pain this morning. Echo currently at bedside.   Inpatient Medications    Scheduled Meds:  aspirin EC  81 mg Oral Daily   Continuous Infusions:  heparin 1,400 Units/hr (04/13/22 2345)   PRN Meds: acetaminophen, nitroGLYCERIN, ondansetron (ZOFRAN) IV   Vital Signs    Vitals:   04/13/22 1830 04/13/22 2043 04/13/22 2331 04/14/22 0518  BP: 119/75 114/72 112/66 109/67  Pulse: 69 69 69 62  Resp: '18 18 18 18  '$ Temp: 97.7 F (36.5 C) 97.8 F (36.6 C) 98.2 F (36.8 C) 98 F (36.7 C)  TempSrc: Oral Oral Oral Oral  SpO2: 99% 94% 90% 96%  Weight:      Height: '5\' 9"'$  (1.753 m)       Intake/Output Summary (Last 24 hours) at 04/14/2022 0816 Last data filed at 04/14/2022 0128 Gross per 24 hour  Intake 558.15 ml  Output --  Net 558.15 ml      04/13/2022    2:19 PM 03/27/2022    3:38 PM 06/29/2020    3:34 PM  Last 3 Weights  Weight (lbs) 200 lb 200 lb 198 lb  Weight (kg) 90.719 kg 90.719 kg 89.812 kg      Telemetry    Sinus Rhythm - Personally Reviewed  ECG    Sinus Rhythm, 63 bpm biphasic T wave in v1-v2 - Personally Reviewed  Physical Exam   GEN: No acute distress.   Neck: No JVD Cardiac: RRR, no murmurs, rubs, or gallops.  Respiratory: Clear to auscultation bilaterally. GI: Soft, nontender, non-distended  MS: No edema; No deformity. Neuro:  Nonfocal  Psych: Normal affect   Labs    High Sensitivity Troponin:   Recent Labs  Lab 04/13/22 1456 04/13/22 1650 04/13/22 2214 04/14/22 0220  TROPONINIHS 160* 286* 608* 612*     Chemistry Recent Labs  Lab 04/13/22 1456 04/14/22 0220  NA 136 138  K 3.8 4.0  CL 105 102  CO2 24 25  GLUCOSE 167* 137*  BUN 17 16  CREATININE 1.01 1.22  CALCIUM 8.5* 9.0  MG  --  2.2  PROT  --  6.5  ALBUMIN  --  3.3*  AST  --  25  ALT  --  30  ALKPHOS  --  48  BILITOT  --  0.6   GFRNONAA >60 >60  ANIONGAP 7 11    Lipids  Recent Labs  Lab 04/14/22 0220  CHOL 173  TRIG 349*  HDL 26*  LDLCALC 77  CHOLHDL 6.7    Hematology Recent Labs  Lab 04/13/22 1456 04/14/22 0220  WBC 9.6 8.7  RBC 5.39 5.14  HGB 14.9 14.5  HCT 43.9 42.6  MCV 81.4 82.9  MCH 27.6 28.2  MCHC 33.9 34.0  RDW 13.3 13.5  PLT 290 257   Thyroid No results for input(s): "TSH", "FREET4" in the last 168 hours.  BNPNo results for input(s): "BNP", "PROBNP" in the last 168 hours.  DDimer No results for input(s): "DDIMER" in the last 168 hours.   Radiology    DG Chest 2 View  Result Date: 04/13/2022 CLINICAL DATA:  Chest pain. EXAM: CHEST - 2 VIEW COMPARISON:  None Available. FINDINGS: The heart size and mediastinal contours are within normal limits. Both lungs are clear. The visualized skeletal structures are unremarkable. IMPRESSION: No active cardiopulmonary disease. Electronically Signed   By:  Dorise Bullion III M.D.   On: 04/13/2022 14:48    Cardiac Studies   EKG: Sinus rhythm, 63 bpm.  With ST and T wave changes concerning for anterior ischemia.  Echo: pending EF 50 to 55%.  Low normal function.  Mild HK of basal-mid inferior/inferolateral wall.  GR 1 DD.  Discordant septal motion.  RV.  Normal valves.  Mild aortic sclerosis without stenosis.  Patient Profile     66 y.o. male with PMH of psoriasis who presented with chest pain and found to have a NSTEMI.   Assessment & Plan    NSTEMI -- hsTn 160>>286>>612. I initial.  Initial EKG without ischemia.  However EKG today does suggest follow-up with ischemia; and there is regional wall motion abnormality noted on echo. -- continue IV heparin, ASA, Crestor '40mg'$  daily, add low dose metoprolol  -- echo reviewed -- planned for cardiac cath today  Shared Decision Making/Informed Consent The risks [stroke (1 in 1000), death (1 in 1000), kidney failure [usually temporary] (1 in 500), bleeding (1 in 200), allergic reaction [possibly  serious] (1 in 200)], benefits (diagnostic support and management of coronary artery disease) and alternatives of a cardiac catheterization were discussed in detail with Dustin Wood and he is willing to proceed.   Hypertriglyceridemia -- Trig 349, HLD 26, LDL 77 -- add Crestor '40mg'$  daily  DM -- Hgb A1c 7.5 -- add SSI -- further discuss medications post cath  For questions or updates, please contact [place] Please consult [email or web address] for contact info under        Signed, Reino Bellis, NP  04/14/2022, 8:16 AM     ATTENDING ATTESTATION   ========================== I have seen, examined and evaluated the patient this AM along with Reino Bellis, NP-C.  After reviewing all the available data and chart, we discussed the patients laboratory, study & physical findings as well as symptoms in detail.  I agree with her findings, examination as well as impression recommendations as per our discussion.    Attending adjustments noted in italics.  I personally reviewed the echocardiogram as noted above.  It does appear to be some regional wall motion normality consistent with possible inferior ischemia, however the EKG did suggest anterior retroseptal.  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  Plan cardiac catheterization today.  We will continue guideline directed medical therapy for for risk factor modification.    Leonie Man, MD, MS Glenetta Hew, M.D., M.S. Interventional Cardiologist  [place]  Pager # [phone] Phone # [phone] [address] [place],  [address]  `

## 2022-04-14 NOTE — Plan of Care (Signed)

## 2022-04-14 NOTE — Progress Notes (Signed)
  Transition of Care [facility]) Screening Note   Patient Details  Name: CHISUM HABENICHT Date of Birth: 04/18/1956   Transition of Care [facility]) CM/SW Contact:    Milas Gain, [place] Phone Number: 04/14/2022, 5:20 PM    Transition of Care Department [facility]) has reviewed patient and no TOC needs have been identified at this time. We will continue to monitor patient advancement through interdisciplinary progression rounds. If new patient transition needs arise, please place a TOC consult.

## 2022-04-14 NOTE — Discharge Instructions (Signed)

## 2022-04-15 ENCOUNTER — Inpatient Hospital Stay (HOSPITAL_COMMUNITY): Payer: BC Managed Care – PPO

## 2022-04-15 ENCOUNTER — Other Ambulatory Visit (HOSPITAL_COMMUNITY): Payer: Self-pay

## 2022-04-15 ENCOUNTER — Encounter (HOSPITAL_COMMUNITY): Payer: Self-pay | Admitting: Cardiovascular Disease

## 2022-04-15 ENCOUNTER — Telehealth (HOSPITAL_COMMUNITY): Payer: Self-pay

## 2022-04-15 ENCOUNTER — Telehealth (HOSPITAL_COMMUNITY): Payer: Self-pay | Admitting: Pharmacy Technician

## 2022-04-15 DIAGNOSIS — E1169 Type 2 diabetes mellitus with other specified complication: Secondary | ICD-10-CM | POA: Diagnosis not present

## 2022-04-15 DIAGNOSIS — I25110 Atherosclerotic heart disease of native coronary artery with unstable angina pectoris: Secondary | ICD-10-CM

## 2022-04-15 DIAGNOSIS — Z01810 Encounter for preprocedural cardiovascular examination: Secondary | ICD-10-CM

## 2022-04-15 DIAGNOSIS — I214 Non-ST elevation (NSTEMI) myocardial infarction: Secondary | ICD-10-CM | POA: Diagnosis not present

## 2022-04-15 DIAGNOSIS — E785 Hyperlipidemia, unspecified: Secondary | ICD-10-CM | POA: Diagnosis not present

## 2022-04-15 LAB — GLUCOSE, CAPILLARY
Glucose-Capillary: 244 mg/dL — ABNORMAL HIGH (ref 70–99)
Glucose-Capillary: 96 mg/dL (ref 70–99)
Glucose-Capillary: 132 mg/dL — ABNORMAL HIGH (ref 70–99)
Glucose-Capillary: 180 mg/dL — ABNORMAL HIGH (ref 70–99)

## 2022-04-15 LAB — URINALYSIS, ROUTINE W REFLEX MICROSCOPIC
Bilirubin Urine: NEGATIVE
Glucose, UA: 250 mg/dL — AB
Hgb urine dipstick: NEGATIVE
Ketones, ur: NEGATIVE mg/dL
Leukocytes,Ua: NEGATIVE
Nitrite: NEGATIVE
Protein, ur: NEGATIVE mg/dL
Specific Gravity, Urine: 1.015 (ref 1.005–1.030)
pH: 6.5 (ref 5.0–8.0)

## 2022-04-15 LAB — CBC
HCT: 39 % (ref 39.0–52.0)
Hemoglobin: 13.4 g/dL (ref 13.0–17.0)
MCH: 28.6 pg (ref 26.0–34.0)
MCHC: 34.4 g/dL (ref 30.0–36.0)
MCV: 83.3 fL (ref 80.0–100.0)
Platelets: 229 10*3/uL (ref 150–400)
RBC: 4.68 MIL/uL (ref 4.22–5.81)
RDW: 13.5 % (ref 11.5–15.5)
WBC: 7.6 10*3/uL (ref 4.0–10.5)
nRBC: 0 % (ref 0.0–0.2)

## 2022-04-15 LAB — BASIC METABOLIC PANEL
Anion gap: 6 (ref 5–15)
BUN: 13 mg/dL (ref 8–23)
CO2: 24 mmol/L (ref 22–32)
Calcium: 8.8 mg/dL — ABNORMAL LOW (ref 8.9–10.3)
Chloride: 106 mmol/L (ref 98–111)
Creatinine, Ser: 1.03 mg/dL (ref 0.61–1.24)
GFR, Estimated: >60 mL/min (ref >60)
Glucose, Bld: 126 mg/dL — ABNORMAL HIGH (ref 70–99)
Potassium: 4 mmol/L (ref 3.5–5.1)
Sodium: 136 mmol/L (ref 135–145)

## 2022-04-15 LAB — HEPARIN LEVEL (UNFRACTIONATED)
Heparin Unfractionated: 0.14 IU/mL — ABNORMAL LOW (ref 0.30–0.70)
Heparin Unfractionated: 0.15 IU/mL — ABNORMAL LOW (ref 0.30–0.70)
Heparin Unfractionated: 0.24 IU/mL — ABNORMAL LOW (ref 0.30–0.70)
Heparin Unfractionated: 0.25 IU/mL — ABNORMAL LOW (ref 0.30–0.70)

## 2022-04-15 LAB — SURGICAL PCR SCREEN
MRSA, PCR: NEGATIVE
Staphylococcus aureus: POSITIVE — AB

## 2022-04-15 MED ORDER — CHLORHEXIDINE GLUCONATE CLOTH 2 % EX PADS
6.0000 | MEDICATED_PAD | Freq: Every day | CUTANEOUS | Status: DC
  Administered 2022-04-16 – 2022-04-17 (×2): 6 via TOPICAL

## 2022-04-15 MED ORDER — MUPIROCIN 2 % EX OINT
1.0000 | TOPICAL_OINTMENT | Freq: Two times a day (BID) | CUTANEOUS | Status: AC
  Administered 2022-04-15 – 2022-04-19 (×9): 1 via NASAL
  Filled 2022-04-15 (×4): qty 22

## 2022-04-15 NOTE — Progress Notes (Addendum)
CARDIAC REHAB PHASE I     Pt pre-op OHS teaching including IS use, OHS booklet, OHS handout, mobility importance, sternal precautions, move in the tub,and  home needs at discharge reviewed with pt and wife. All questions and concerns addressed. Was able to reach 2500 with IS today. Will continue to follow.   1315-1400  Vanessa Barbara, RN BSN 04/15/2022 2:04 PM

## 2022-04-15 NOTE — Progress Notes (Signed)
Pre-CABG exams have been completed.   Results can be found under chart review under CV PROC. 04/15/2022 3:01 PM Cotey Rakes RVT, RDMS

## 2022-04-15 NOTE — Progress Notes (Signed)
ANTICOAGULATION CONSULT NOTE- follow-up  Pharmacy Consult for Heparin Indication: chest pain/ACS, s/p cath, pending CABG evaluation   Allergies  Allergen Reactions   Penicillins Other (See Comments)    Childhood allergy    Patient Measurements: Height: '5\' 9"'$  (175.3 cm) Weight: 89.2 kg (196 lb 9.6 oz) IBW/kg (Calculated) : 70.7 Heparin Dosing Weight: 89.1 kg   Vital Signs: Temp: 98 F (36.7 C) (09/26 0847) Temp Source: Oral (09/26 0847) BP: 122/66 (09/26 0847) Pulse Rate: 74 (09/26 0847)  Labs: Recent Labs    04/13/22 1456 04/13/22 1456 04/13/22 1650 04/13/22 2214 04/14/22 0220 04/14/22 0703 04/15/22 0203 04/15/22 0234  HGB 14.9  --   --   --  14.5  --   --  13.4  HCT 43.9  --   --   --  42.6  --   --  39.0  PLT 290  --   --   --  257  --   --  229  LABPROT 12.7  --   --   --   --   --   --   --   INR 1.0  --   --   --   --   --   --   --   HEPARINUNFRC  --    < >  --  0.13*  --  0.29* 0.14* 0.15*  CREATININE 1.01  --   --   --  1.22  --   --  1.03  TROPONINIHS 160*  --  286* 608* 612*  --   --   --    < > = values in this interval not displayed.     Estimated Creatinine Clearance: 80 mL/min (by C-G formula based on SCr of 1.03 mg/dL).   Medical History: Past Medical History:  Diagnosis Date   Melanoma ([place]) 04/07/2019   right fore arm MIS TX EXC   SCC (squamous cell carcinoma) 06/30/2016   RIGHT DORSAL HAND CX3 5FU   SCC (squamous cell carcinoma) 06/30/2016   LEFT HAND TX CX3 5FU   Squamous cell carcinoma of skin 12/2015   RIGHT TEMPLE CX3 5FU    Medications:  Medications Prior to Admission  Medication Sig Dispense Refill Last Dose   ascorbic acid (VITAMIN C) 500 MG tablet Take 500 mg by mouth daily.   04/13/2022   Cholecalciferol (VITAMIN D3) 50 MCG (2000 UT) TABS Take 1 tablet by mouth daily.   04/13/2022   fluorouracil (EFUDEX) 5 % cream APPLY TO AFFECTED AREA(S) AT BEDTIME AS DIRECTED (Patient taking differently: Apply 1 Application topically  daily as needed (for rash).) 40 g 1 unknown   Multiple Vitamin (MULITIVITAMIN WITH MINERALS) TABS Take 1 tablet by mouth daily.   04/12/2022   omega-3 acid ethyl esters (LOVAZA) 1 g capsule Take 1 g by mouth 2 (two) times daily.   04/13/2022   saw palmetto 500 MG capsule Take 500 mg by mouth in the morning, at noon, in the evening, and at bedtime.   04/13/2022   Turmeric (QC TUMERIC COMPLEX) 500 MG CAPS Take 1 capsule by mouth daily.   04/13/2022   zinc gluconate 50 MG tablet Take 50 mg by mouth daily.   04/13/2022   terbinafine (LAMISIL) 250 MG tablet Take 1 tablet (250 mg total) by mouth daily. (Patient not taking: Reported on 01/28/2021) 30 tablet 3 Not Taking     Assessment: 32 yom presenting with chest pain. Heparin per pharmacy consult placed for chest pain/ACS. Patient is not  on anticoagulation prior to arrival. Patient has returned to unit s/p planned cardiac cath. Order received to restart the heparin infusion without a bolus 2 hours after the sheath has been removed. Removal was noted at 16:18 on 9/25.   Heparin level today is subtherapeutic at 0.24, on 1550 units/hr. Hgb 13.4, plt 229--stable. No line issues or signs/symptoms of bleeding reported. Will increase heparin gtt by ~2 units/kg/hr.    Goal of Therapy:  Heparin level 0.3-0.7 units/ml Monitor platelets by anticoagulation protocol: Yes   Plan:  Increase heparin gtt to 1700 units/hr F/u heparin level in 6hrs  Monitor heparin level, CBC, and s/sx of bleeding daily  F/U CABG eval    Billey Gosling, PharmD PGY1 Pharmacy Resident 9/26/202312:55 PM

## 2022-04-15 NOTE — Consult Note (Addendum)
[name] [address]             [phone]        Garison C Douthat [place] Medical Record #[id number] Date of Birth: 09-30-1955  Referring: No ref. provider found Primary Care: Susy Frizzle, MD Primary Cardiologist:None  Reason for consult: Evaluation for coronary bypass grafting in the setting of single-vessel coronary artery disease presenting with acute non-ST elevation myocardial infarction.   History of Present Illness:     [name] is a 66 year old gentleman with past history of psoriasis as well as both melanoma and squamous cell carcinoma.  He has no other significant past medical history or family history of coronary artery disease.  He is a non-smoker.  He developed some chest pain while hiking with his family on 04/13/2022.  This was associated with nausea and diaphoresis.   The pain radiated to the left arm and shoulder.  He presented to [facility].  Initial EKG was unremarkable showing no ischemic changes.  High-sensitivity troponin was elevated at 160 and later rose to peak of 600.  [name] was transferred to the [facility] with a diagnosis of acute non-ST elevation myocardial infarction.  He was treated with aspirin and started on heparin. Repeat EKG after admission did show ischemic changes.  An echocardiogram was performed showing ejection fraction of 50 to 55% with hypokinesis of the inferior wall.  There was discordant motion of the septum.  There were no structural or functional valvular abnormalities.  [name] remained stable and underwent left heart catheterization yesterday afternoon demonstrating wall LAD stenosis with involvement at the first diagonal the RCA and circumflex had no obstructive disease.  [name] is resting in bed with his wife at the bedside.  He is in no distress and denies having any pain or shortness of breath. 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  He is right-handed.   Current Activity/ Functional Status: Patient is independent with mobility/ambulation, transfers, ADL's, IADL's.   Zubrod Score: At the time of surgery this patient's most appropriate activity status/level should be described as: '[]'$     0    Normal activity, no symptoms '[]'$     1    Restricted in physical strenuous activity but ambulatory, able to do out light work '[]'$     2    Ambulatory and capable of self care, unable to do work activities, up and about                 more than 50%  Of the time                            '[]'$     3    Only limited self care, in bed greater than 50% of waking hours '[]'$     4    Completely disabled, no self care, confined to bed or chair '[]'$     5    Moribund  Past Medical History:  Diagnosis Date   Melanoma ([place]) 04/07/2019   right fore arm MIS TX EXC   SCC (squamous cell carcinoma) 06/30/2016   RIGHT DORSAL HAND CX3 5FU   SCC (squamous cell carcinoma) 06/30/2016   LEFT HAND TX CX3 5FU   Squamous cell carcinoma of skin 12/2015   RIGHT TEMPLE CX3 5FU    Past Surgical  History:  Procedure Laterality Date   CHOLECYSTECTOMY     HERNIA REPAIR     LEFT HEART CATH AND CORONARY ANGIOGRAPHY N/A 04/14/2022   Procedure: LEFT HEART CATH AND CORONARY ANGIOGRAPHY;  Surgeon: Lorretta Harp, MD;  Location: [facility];  Service: Cardiovascular;  Laterality: N/A;   MELANOMA EXCISION      Social History   Tobacco Use  Smoking Status Never  Smokeless Tobacco Never    Social History   Substance and Sexual Activity  Alcohol Use Yes   Comment: rare     Allergies  Allergen Reactions   Penicillins Other (See Comments)    Childhood allergy    Current Facility-Administered Medications  Medication Dose Route Frequency Provider Last Rate Last Admin   0.9 %  sodium chloride infusion  250 mL Intravenous PRN Lorretta Harp, MD       acetaminophen (TYLENOL) tablet 650 mg  650 mg Oral Q4H PRN Lorretta Harp, MD       aspirin EC tablet 81 mg  81 mg Oral Daily Lorretta Harp, MD   81 mg at 04/15/22 0848   heparin ADULT infusion 100 units/mL (25000 units/258m)  1,550 Units/hr Intravenous Continuous LErenest Blank RPH 15.5 mL/hr at 04/15/22 0717 1,550 Units/hr at 04/15/22 0717   insulin aspart (novoLOG) injection 0-5 Units  0-5 Units Subcutaneous QHS BLorretta Harp MD       insulin aspart (novoLOG) injection 0-9 Units  0-9 Units Subcutaneous TID WC BLorretta Harp MD   3 Units at 04/15/22 0858   metoprolol tartrate (LOPRESSOR) tablet 12.5 mg  12.5 mg Oral BID BLorretta Harp MD   12.5 mg at 04/15/22 0848   morphine (PF) 2 MG/ML injection 2 mg  2 mg Intravenous Q1H PRN BLorretta Harp MD       nitroGLYCERIN (NITROSTAT) SL tablet 0.4 mg  0.4 mg Sublingual Q5 Min x 3 PRN BLorretta Harp MD       ondansetron ([facility] injection 4 mg  4 mg Intravenous Q6H PRN BLorretta Harp MD       rosuvastatin (CRESTOR) tablet 40 mg  40 mg Oral Daily BLorretta Harp MD   40 mg at 04/15/22 0849   sodium chloride flush (NS) 0.9 % injection 3 mL  3 mL Intravenous Q12H BLorretta Harp MD   3 mL at 04/14/22 2156   sodium chloride flush (NS) 0.9 % injection 3 mL  3 mL Intravenous Q12H BLorretta Harp MD   3 mL at 04/14/22 2000   sodium chloride flush (NS) 0.9 % injection 3 mL  3 mL Intravenous PRN BLorretta Harp MD        Medications Prior to Admission  Medication Sig Dispense Refill Last Dose   ascorbic acid (VITAMIN C) 500 MG tablet Take 500 mg by mouth daily.   04/13/2022   Cholecalciferol (VITAMIN D3) 50 MCG (2000 UT) TABS Take 1 tablet by mouth daily.   04/13/2022   fluorouracil (EFUDEX) 5 % cream APPLY TO AFFECTED AREA(S) AT BEDTIME AS DIRECTED (Patient taking differently: Apply 1 Application topically daily as needed (for rash).) 40 g 1 unknown   Multiple Vitamin (MULITIVITAMIN WITH MINERALS) TABS Take 1 tablet by mouth daily.   04/12/2022   omega-3 acid ethyl esters  (LOVAZA) 1 g capsule Take 1 g by mouth 2 (two) times daily.   04/13/2022   saw palmetto 500 MG capsule Take 500 mg by mouth in the  morning, at noon, in the evening, and at bedtime.   04/13/2022   Turmeric (QC TUMERIC COMPLEX) 500 MG CAPS Take 1 capsule by mouth daily.   04/13/2022   zinc gluconate 50 MG tablet Take 50 mg by mouth daily.   04/13/2022   terbinafine (LAMISIL) 250 MG tablet Take 1 tablet (250 mg total) by mouth daily. (Patient not taking: Reported on 01/28/2021) 30 tablet 3 Not Taking    No family history of coronary artery disease.   Review of Systems:       Cardiac Review of Systems: Y or  [    ]= no  Chest Pain [ x   ]  Resting SOB [   ] Exertional SOB  [ x ]  Orthopnea [  ]   Pedal Edema [   ]    Palpitations [  ] Syncope  [  ]   Presyncope [   ]  General Review of Systems: [Y] = yes [  ]=no Constitional: recent weight change [  ]; anorexia [  ]; fatigue [  ]; nausea [ x ]; night sweats [  ]; fever [  ]; or chills [    Eye : blurred vision [  ]; diplopia [   ]; vision changes [  ];  Amaurosis fugax[  ]; Resp: cough [  ];  wheezing[  ];  hemoptysis[  ]; shortness of breath[  ]; paroxysmal nocturnal dyspnea[  ]; dyspnea on exertion[  ]; or orthopnea[  ];  GI:  gallstones[  ], vomiting[  ];  dysphagia[  ]; melena[  ];  hematochezia [  ]; heartburn[  ];   Hx of  Colonoscopy[  ]; GU: kidney stones [  ]; hematuria[  ];   dysuria [  ];  nocturia[  ];  history of     obstruction [  ]; urinary frequency [  ]             Skin: rash, swelling[  ];, hair loss[  ];  peripheral edema[  ];  or itching[  ]; Musculosketetal: myalgias[  ];  joint swelling[  ];  joint erythema[  ];  joint pain[  ];  back pain[  ];  Heme/Lymph: bruising[  ];  bleeding[  ];  anemia[  ];  Neuro: TIA[  ];  headaches[  ];  stroke[  ];  vertigo[  ];  seizures[  ];   paresthesias[  ];  difficulty walking[  ];  Psych:depression[  ]; anxiety[  ];  Endocrine: diabetes[ new diagnosis ];  thyroid dysfunction[  ];                   Physical Exam: BP 122/66 (BP Location: Left Arm)   Pulse 74   Temp 98 F (36.7 C) (Oral)   Resp 18   Ht '5\' 9"'$  (1.753 m)   Wt 89.2 kg   SpO2 95%   BMI 29.03 kg/m    General appearance: alert, cooperative, and no distress Head: Normocephalic, without obvious abnormality, atraumatic Neck: no adenopathy and no carotid bruit Lymph nodes: Cervical, supraclavicular, and axillary nodes normal. Resp: Breath sounds are clear, full, and equal.  Normal respiratory excursion Cardio: Normal sinus rhythm noted on monitor.  Heart sounds normal without murmur. GI: Soft, flat, nontender Extremities: No obvious deformities, all well perfused with palpable distal pulses.  No edema.  Modified Allen's test was formed to the left upper extremity utilizing a pulse oximetry probe on the left thumb.  There was no change in the waveform with manual occlusion of the left radial artery. Neurologic: Grossly normal  Diagnostic Studies & Laboratory data:  Cath: 04/14/22     Ost LAD to Prox LAD lesion is 95% stenosed.   1st Diag lesion is 95% stenosed.   The left ventricular systolic function is normal.   LV end diastolic pressure is normal.   The left ventricular ejection fraction is 50-55% by visual estimate.   IMPRESSION: [name] has 90% thrombotic ostial/proximal segmental LAD stenosis involving the origin of a large first diagonal branch.  His RCA and circumflex are free of significant obstructive disease.  His LV function is normal as is his LVEDP.  His anatomy is not suitable for percutaneous intervention.  He will need CABG.  I have reviewed his anatomy with Dr. Ellyn Hack who agrees.  We will will reheparinize him 2 hours after sheath removal.  He left the lab in stable condition.   Quay Burow. MD, [facility] 04/14/2022 4:30 PM   Diagnostic Dominance: Right  Echo: 04/14/22   IMPRESSIONS     1. Left ventricular ejection fraction, by estimation, is 50 to 55%. The  left ventricle has low  normal function. The left ventricle demonstrates  regional wall motion abnormalities (see scoring diagram/findings for  description). Left ventricular diastolic   parameters are consistent with Grade I diastolic dysfunction (impaired  relaxation). There is incoordinate septal motion. There is mild  hypokinesis of the left ventricular, basal-mid inferior wall and  inferolateral wall.   2. Right ventricular systolic function is low normal. The right  ventricular size is normal.   3. The mitral valve is grossly normal. Trivial mitral valve  regurgitation.   4. The aortic valve is tricuspid. Aortic valve regurgitation is not  visualized. Aortic valve sclerosis/calcification is present, without any  evidence of aortic stenosis.   Comparison(s): No prior Echocardiogram.   FINDINGS   Left Ventricle: Left ventricular ejection fraction, by estimation, is 50  to 55%. The left ventricle has low normal function. The left ventricle  demonstrates regional wall motion abnormalities. Mild hypokinesis of the  left ventricular, basal-mid inferior   wall and inferolateral wall. The left ventricular internal cavity size  was normal in size. There is no left ventricular hypertrophy. Incoordinate  septal motion. Left ventricular diastolic parameters are consistent with  Grade I diastolic dysfunction  (impaired relaxation). Normal left ventricular filling pressure.   Right Ventricle: The right ventricular size is normal. No increase in  right ventricular wall thickness. Right ventricular systolic function is  low normal.   Left Atrium: Left atrial size was normal in size.   Right Atrium: Right atrial size was normal in size.   Pericardium: There is no evidence of pericardial effusion.   Mitral Valve: The mitral valve is grossly normal. Trivial mitral valve  regurgitation.   Tricuspid Valve: The tricuspid valve is grossly normal. Tricuspid valve  regurgitation is trivial.   Aortic Valve: The  aortic valve is tricuspid. Aortic valve regurgitation is  not visualized. Aortic valve sclerosis/calcification is present, without  any evidence of aortic stenosis.   Pulmonic Valve: The pulmonic valve was grossly normal. Pulmonic valve  regurgitation is trivial.   Aorta: The aortic root and ascending aorta are structurally normal, with  no evidence of dilitation.   Venous: The inferior vena cava was not well visualized.   IAS/Shunts: No atrial level shunt detected by color flow Doppler.      Recent Radiology Findings:   DG Chest  2 View  Result Date: 04/13/2022 CLINICAL DATA:  Chest pain. EXAM: CHEST - 2 VIEW COMPARISON:  None Available. FINDINGS: The heart size and mediastinal contours are within normal limits. Both lungs are clear. The visualized skeletal structures are unremarkable. IMPRESSION: No active cardiopulmonary disease. Electronically Signed   By: Dorise Bullion III M.D.   On: 04/13/2022 14:48      I have independently reviewed the above radiologic studies and discussed with the patient   Recent Lab Findings: Lab Results  Component Value Date   WBC 7.6 04/15/2022   HGB 13.4 04/15/2022   HCT 39.0 04/15/2022   PLT 229 04/15/2022   GLUCOSE 126 (H) 04/15/2022   CHOL 173 04/14/2022   TRIG 349 (H) 04/14/2022   HDL 26 (L) 04/14/2022   LDLCALC 77 04/14/2022   ALT 30 04/14/2022   AST 25 04/14/2022   NA 136 04/15/2022   K 4.0 04/15/2022   CL 106 04/15/2022   CREATININE 1.03 04/15/2022   BUN 13 04/15/2022   CO2 24 04/15/2022   INR 1.0 04/13/2022   HGBA1C 7.5 (H) 04/14/2022      Assessment / Plan:      -Coronary artery disease- 66 yo male with no prior cardiac history presents with acute NSTEMI and mildly reduced LV function is found to have a long, complex LAD stenosis extending to the ostium of the first diagonal coronary artery. Dr. Martinique does not feel this is treatable with a percutaneous approach and we agree coronary bypass grafting is the best option for  management. Coronary bypass grafting was discussed with Dustin Wood and his wife along with the expected perioperative course and recovery.  They would like for [place] to proceed with plans for coronary bypass grafting. We will tentatively plan for CABG on Thursday 04/17/22.   -Type 2 diabetes mellitus-new diagnosis this admission.  Hemoglobin A1c was 7.5 on admission.  He will need close follow-up with his primary care physician after discharge.  -History of psoriasis, melanoma, and squamous cell carcinoma  -Dyslipidemia-new diagnosis, rosuvastatin has been initiated.   I  spent 25 minutes counseling the patient face to face.  Antony Odea, PA-C  04/15/2022 10:05 AM    Agree with above 66yo male with single vessel CAD.  The LAD appears intramyocardial.  We discussed the risks and benefits of surgical revascularization with use of the LIMA and radial artery.  He is agreeable to proceed, and is scheduled for 04/17/22.  Dustin Wood Bary Leriche

## 2022-04-15 NOTE — Inpatient Diabetes Management (Signed)
Inpatient Diabetes Program Recommendations  AACE/ADA: New Consensus Statement on Inpatient Glycemic Control (2015)  Target Ranges:  Prepandial:   less than 140 mg/dL      Peak postprandial:   less than 180 mg/dL (1-2 hours)      Critically ill patients:  140 - 180 mg/dL    Latest Reference Range & Units 04/14/22 02:20  Hemoglobin A1C 4.8 - 5.6 % 7.5 (H)   168 mg/dl  (H): Data is abnormally high  Latest Reference Range & Units 04/14/22 12:04 04/14/22 17:25 04/14/22 21:51 04/15/22 08:55  Glucose-Capillary 70 - 99 mg/dL 108 (H) 102 (H) 124 (H) 244 (H)  (H): Data is abnormally high   Admit with: CP/ NSTEMI   New Diagnosis of Diabetes as evidenced by A1c of 7.5% and no prior diabetes diagnosis  Current Orders: Novolog Sensitive Correction Scale/ SSI (0-9 units) TID AC + HS    Met w/ pt and wife yesterday to review/discuss new diabetes diagnosis (see note for details 9/25).  Met w/ pt and wife again this AM.  Re-reviewed diabetes basics of home care w/ pt and wife.  Living well with diabetes book at bedside, Educational videos on diabetes attached as QR codes to d/c paperwork, RD consulted for diet edu.  Wife stated she is familiar with checking CBGs b/c she had GDM and she will help pt at home.  We re-reviewed importance of nutrition changes, exercise, and medications if needed for home in controlling CBG levels.  Reviewed long-term complications of uncontrolled CBGs, goal CBGs and goal A1c for home, importance of follow up with PCP for diabetes care after d/c.  Also discussed with pt and wife that I am unsure at this point what meds (if any) pt will be sent home with for glucose control, but I did mention Jardiance as a possibility for both heart protection and glucose control (explained what Vania Rea is and how it works).  Will ask MD to give pt Rx for CBG meter when he goes home.  Have also placed referral to outpatient diabetes education to [facility].     --Will follow patient during  hospitalization--  Wyn Quaker RN, MSN, [place] Diabetes Coordinator Inpatient Glycemic Control Team Team Pager: [phone] (8a-5p)

## 2022-04-15 NOTE — TOC Benefit Eligibility Note (Signed)
Patient Teacher, English as a foreign language completed.    The patient is currently admitted and upon discharge could be taking Vascepa 1 g capsule.  Requires Prior Authorization  The patient is insured through [place], [place] Patient [place] Patient Advocate Team Direct Number: [id number]  Fax: [phone]

## 2022-04-15 NOTE — Progress Notes (Signed)
ANTICOAGULATION CONSULT NOTE- follow-up  Pharmacy Consult for Heparin Indication: chest pain/ACS, s/p cath, pending CABG evaluation   Allergies  Allergen Reactions   Penicillins Other (See Comments)    Childhood allergy    Patient Measurements: Height: '5\' 9"'$  (175.3 cm) Weight: 90.7 kg (200 lb) IBW/kg (Calculated) : 70.7 Heparin Dosing Weight: 89.1 kg   Vital Signs: Temp: 98.6 F (37 C) (09/26 0012) Temp Source: Oral (09/26 0012) BP: 116/73 (09/26 0012) Pulse Rate: 62 (09/26 0012)  Labs: Recent Labs    04/13/22 1456 04/13/22 1456 04/13/22 1650 04/13/22 2214 04/14/22 0220 04/14/22 0703 04/15/22 0203 04/15/22 0234  HGB 14.9  --   --   --  14.5  --   --  13.4  HCT 43.9  --   --   --  42.6  --   --  39.0  PLT 290  --   --   --  257  --   --  229  LABPROT 12.7  --   --   --   --   --   --   --   INR 1.0  --   --   --   --   --   --   --   HEPARINUNFRC  --    < >  --  0.13*  --  0.29* 0.14* 0.15*  CREATININE 1.01  --   --   --  1.22  --   --   --   TROPONINIHS 160*  --  286* 608* 612*  --   --   --    < > = values in this interval not displayed.     Estimated Creatinine Clearance: 68.1 mL/min (by C-G formula based on SCr of 1.22 mg/dL).   Medical History: Past Medical History:  Diagnosis Date   Melanoma ([place]) 04/07/2019   right fore arm MIS TX EXC   SCC (squamous cell carcinoma) 06/30/2016   RIGHT DORSAL HAND CX3 5FU   SCC (squamous cell carcinoma) 06/30/2016   LEFT HAND TX CX3 5FU   Squamous cell carcinoma of skin 12/2015   RIGHT TEMPLE CX3 5FU    Medications:  Medications Prior to Admission  Medication Sig Dispense Refill Last Dose   ascorbic acid (VITAMIN C) 500 MG tablet Take 500 mg by mouth daily.   04/13/2022   Cholecalciferol (VITAMIN D3) 50 MCG (2000 UT) TABS Take 1 tablet by mouth daily.   04/13/2022   fluorouracil (EFUDEX) 5 % cream APPLY TO AFFECTED AREA(S) AT BEDTIME AS DIRECTED (Patient taking differently: Apply 1 Application topically daily as  needed (for rash).) 40 g 1 unknown   Multiple Vitamin (MULITIVITAMIN WITH MINERALS) TABS Take 1 tablet by mouth daily.   04/12/2022   omega-3 acid ethyl esters (LOVAZA) 1 g capsule Take 1 g by mouth 2 (two) times daily.   04/13/2022   saw palmetto 500 MG capsule Take 500 mg by mouth in the morning, at noon, in the evening, and at bedtime.   04/13/2022   Turmeric (QC TUMERIC COMPLEX) 500 MG CAPS Take 1 capsule by mouth daily.   04/13/2022   zinc gluconate 50 MG tablet Take 50 mg by mouth daily.   04/13/2022   terbinafine (LAMISIL) 250 MG tablet Take 1 tablet (250 mg total) by mouth daily. (Patient not taking: Reported on 01/28/2021) 30 tablet 3 Not Taking     Assessment: 64 yom presenting with chest pain. Heparin per pharmacy consult placed for chest pain/ACS. Patient is not  on anticoagulation prior to arrival. Patient has returned to unit s/p Planned cardiac cath. Order received to restart the heparin infusion without a bolus 2 hours after the sheath has been removed. Removal was noted at 16:18  9/26 AM update:  Heparin level low Pending CABG eval  Goal of Therapy:  Heparin level 0.3-0.7 units/ml Monitor platelets by anticoagulation protocol: Yes   Plan:  Inc heparin to 1550 units/hr 1200 heparin level F/U CABG eval   Narda Bonds, PharmD, BCPS Clinical Pharmacist Phone: [phone]

## 2022-04-15 NOTE — Plan of Care (Signed)

## 2022-04-15 NOTE — Progress Notes (Signed)
ANTICOAGULATION CONSULT NOTE- follow-up  Pharmacy Consult for Heparin Indication: chest pain/ACS, s/p cath, pending CABG evaluation   Allergies  Allergen Reactions   Penicillins Other (See Comments)    Childhood allergy    Patient Measurements: Height: '5\' 9"'$  (175.3 cm) Weight: 89.2 kg (196 lb 9.6 oz) IBW/kg (Calculated) : 70.7 Heparin Dosing Weight: 89.1 kg   Vital Signs: Temp: 98.5 F (36.9 C) (09/26 1521) Temp Source: Oral (09/26 1521) BP: 124/72 (09/26 1521) Pulse Rate: 69 (09/26 1521)  Labs: Recent Labs    04/13/22 1456 04/13/22 1650 04/13/22 2214 04/14/22 0220 04/14/22 0703 04/15/22 0234 04/15/22 1211 04/15/22 1919  HGB 14.9  --   --  14.5  --  13.4  --   --   HCT 43.9  --   --  42.6  --  39.0  --   --   PLT 290  --   --  257  --  229  --   --   LABPROT 12.7  --   --   --   --   --   --   --   INR 1.0  --   --   --   --   --   --   --   HEPARINUNFRC  --   --  0.13*  --    < > 0.15* 0.24* 0.25*  CREATININE 1.01  --   --  1.22  --  1.03  --   --   TROPONINIHS 160* 286* 608* 612*  --   --   --   --    < > = values in this interval not displayed.     Estimated Creatinine Clearance: 80 mL/min (by C-G formula based on SCr of 1.03 mg/dL).   Medical History: Past Medical History:  Diagnosis Date   Melanoma ([place]) 04/07/2019   right fore arm MIS TX EXC   SCC (squamous cell carcinoma) 06/30/2016   RIGHT DORSAL HAND CX3 5FU   SCC (squamous cell carcinoma) 06/30/2016   LEFT HAND TX CX3 5FU   Squamous cell carcinoma of skin 12/2015   RIGHT TEMPLE CX3 5FU    Medications:  Medications Prior to Admission  Medication Sig Dispense Refill Last Dose   ascorbic acid (VITAMIN C) 500 MG tablet Take 500 mg by mouth daily.   04/13/2022   Cholecalciferol (VITAMIN D3) 50 MCG (2000 UT) TABS Take 1 tablet by mouth daily.   04/13/2022   fluorouracil (EFUDEX) 5 % cream APPLY TO AFFECTED AREA(S) AT BEDTIME AS DIRECTED (Patient taking differently: Apply 1 Application topically  daily as needed (for rash).) 40 g 1 unknown   Multiple Vitamin (MULITIVITAMIN WITH MINERALS) TABS Take 1 tablet by mouth daily.   04/12/2022   omega-3 acid ethyl esters (LOVAZA) 1 g capsule Take 1 g by mouth 2 (two) times daily.   04/13/2022   saw palmetto 500 MG capsule Take 500 mg by mouth in the morning, at noon, in the evening, and at bedtime.   04/13/2022   Turmeric (QC TUMERIC COMPLEX) 500 MG CAPS Take 1 capsule by mouth daily.   04/13/2022   zinc gluconate 50 MG tablet Take 50 mg by mouth daily.   04/13/2022   terbinafine (LAMISIL) 250 MG tablet Take 1 tablet (250 mg total) by mouth daily. (Patient not taking: Reported on 01/28/2021) 30 tablet 3 Not Taking     Assessment: 41 yom presenting with chest pain. Heparin per pharmacy consult placed for chest pain/ACS. Patient is not  on anticoagulation prior to arrival. Patient has returned to unit s/p planned cardiac cath. Order received to restart the heparin infusion without a bolus 2 hours after the sheath has been removed. Removal was noted at 16:18 on 9/25.   Heparin level today is subtherapeutic at 0.25, on 1700 units/hr. Hgb 13.4, plt 229--stable from this AM. Per RN, no line issues or signs/symptoms of bleeding reported.   Goal of Therapy:  Heparin level 0.3-0.7 units/ml Monitor platelets by anticoagulation protocol: Yes   Plan:  Increase heparin gtt to 2000 units/hr F/u heparin level in 6hrs '@0300'$  Monitor heparin level, CBC, and s/sx of bleeding daily  F/U CABG eval  Wilson Singer, PharmD Clinical Pharmacist 04/15/2022 8:24 PM

## 2022-04-15 NOTE — Telephone Encounter (Signed)
Pharmacy Patient Advocate Encounter  Insurance verification completed.    The patient is insured through [employer]   The patient is currently admitted and ran test claims for the following: Vascepa.  Copays and coinsurance results were relayed to Inpatient clinical team.

## 2022-04-15 NOTE — Telephone Encounter (Addendum)
Received notification from [facility] regarding a prior authorization for Vascepa.   Authorization has been APPROVED.  Per test claim, copay for 30 days supply is $16    Key# [id number]  Approval dates: 04/15/22 TO 04/15/25

## 2022-04-15 NOTE — Progress Notes (Addendum)
Rounding Note    Patient Name: Dustin Wood Date of Encounter: 04/15/2022  [place] Cardiologist: None   Subjective   No chest pain. Awaiting TCTS consult.  Seen by Mr. Michael Litter from CVTS, still waiting for attending to staff.  Tentatively planning CABG on Thursday 9/28  Inpatient Medications    Scheduled Meds:  aspirin EC  81 mg Oral Daily   insulin aspart  0-5 Units Subcutaneous QHS   insulin aspart  0-9 Units Subcutaneous TID WC   metoprolol tartrate  12.5 mg Oral BID   rosuvastatin  40 mg Oral Daily   sodium chloride flush  3 mL Intravenous Q12H   sodium chloride flush  3 mL Intravenous Q12H   Continuous Infusions:  sodium chloride     heparin 1,550 Units/hr (04/15/22 0717)   PRN Meds: sodium chloride, acetaminophen, morphine injection, nitroGLYCERIN, ondansetron (ZOFRAN) IV, sodium chloride flush   Vital Signs    Vitals:   04/14/22 1650 04/14/22 2043 04/15/22 0012 04/15/22 0500  BP: 114/80 138/77 116/73 128/77  Pulse: 71 82 62 66  Resp: '16 17 18 18  '$ Temp: 98.3 F (36.8 C) 98.1 F (36.7 C) 98.6 F (37 C) 98.2 F (36.8 C)  TempSrc: Oral Oral Oral Oral  SpO2: 94% 93% 94% 94%  Weight:    89.2 kg  Height:        Intake/Output Summary (Last 24 hours) at 04/15/2022 4970 Last data filed at 04/14/2022 1723 Gross per 24 hour  Intake 815.05 ml  Output --  Net 815.05 ml      04/15/2022    5:00 AM 04/13/2022    2:19 PM 03/27/2022    3:38 PM  Last 3 Weights  Weight (lbs) 196 lb 9.6 oz 200 lb 200 lb  Weight (kg) 89.177 kg 90.719 kg 90.719 kg      Telemetry    Sinus Rhythm - Personally Reviewed  ECG    Sinus Rhythm, 61 bpm TWI in v1-v2 - Personally Reviewed  Physical Exam   GEN: No acute distress.   resting comfortably in bed. Neck: No JVD Cardiac: RRR, no murmurs, rubs, or gallops.  Respiratory: Clear to auscultation bilaterally.  Nonlabored, good air movement. GI: Soft, nontender, non-distended  MS: No edema; No deformity. Right  radial cath site stable.  Neuro:  Nonfocal  Psych: Normal mood and affect   Labs    High Sensitivity Troponin:   Recent Labs  Lab 04/13/22 1456 04/13/22 1650 04/13/22 2214 04/14/22 0220  TROPONINIHS 160* 286* 608* 612*     Chemistry Recent Labs  Lab 04/13/22 1456 04/14/22 0220 04/15/22 0234  NA 136 138 136  K 3.8 4.0 4.0  CL 105 102 106  CO2 '24 25 24  '$ GLUCOSE 167* 137* 126*  BUN '17 16 13  '$ CREATININE 1.01 1.22 1.03  CALCIUM 8.5* 9.0 8.8*  MG  --  2.2  --   PROT  --  6.5  --   ALBUMIN  --  3.3*  --   AST  --  25  --   ALT  --  30  --   ALKPHOS  --  48  --   BILITOT  --  0.6  --   GFRNONAA >60 >60 >60  ANIONGAP '7 11 6    '$ Lipids  Recent Labs  Lab 04/14/22 0220  CHOL 173  TRIG 349*  HDL 26*  LDLCALC 77  CHOLHDL 6.7    Hematology Recent Labs  Lab 04/13/22 1456 04/14/22 0220  04/15/22 0234  WBC 9.6 8.7 7.6  RBC 5.39 5.14 4.68  HGB 14.9 14.5 13.4  HCT 43.9 42.6 39.0  MCV 81.4 82.9 83.3  MCH 27.6 28.2 28.6  MCHC 33.9 34.0 34.4  RDW 13.3 13.5 13.5  PLT 290 257 229   Thyroid No results for input(s): "TSH", "FREET4" in the last 168 hours.  BNPNo results for input(s): "BNP", "PROBNP" in the last 168 hours.  DDimer No results for input(s): "DDIMER" in the last 168 hours.   Radiology    DG Chest 2 View  Result Date: 04/13/2022 CLINICAL DATA:  Chest pain. EXAM: CHEST - 2 VIEW COMPARISON:  None Available. FINDINGS: The heart size and mediastinal contours are within normal limits. Both lungs are clear. The visualized skeletal structures are unremarkable. IMPRESSION: No active cardiopulmonary disease. Electronically Signed   By: Dorise Bullion III M.D.   On: 04/13/2022 14:48    Cardiac Studies   Cath: 04/14/22    Ost LAD to Prox LAD lesion is 95% stenosed;   1st Diag lesion is 95% stenosed. => Bifurcation lesion   The left ventricular systolic function is normal.The left ventricular ejection fraction is 50-55% by visual estimate.   LV end diastolic  pressure is normal.  Diagnostic Dominance: Right  Echo: 04/14/22: EF 50 to 55%.  Low normal function.  Mild HK of basal to mid inferior and inferolateral wall.  Normal RV.  Normal MV.  Not well-visualized aortic valve.  Mild sclerosis but no stenosis.  No significant AI or AS  Patient Profile     66 y.o. male with PMH of psoriasis who presented with chest pain and found to have a NSTEMI.  Assessment & Plan    NSTEMI -- hsTn 160>>286>>612. Initial EKG without ischemia but follow up EKG showed biphasic TW in anterior leads. Underwent cardiac cath yesterday with ostial LAD lesion of 95%, along with 1st diag lesion of 95%. Recommendations for TCTS consult -- continue IV heparin, ASA, Crestor '40mg'$  daily, and low dose metoprolol  -- Echo showed LVEF of 50-55%, g1DD, hypokinesis of the LV, basal-mid inferior wall and inferolateral wall (interesting based on the fact that his lesion was LAD.  I suspect that there may be the inferior wall hypokinesis from wraparound effect.   Hypertriglyceridemia -- Trig 349, HLD 26, LDL 77-> Suspect the triglycerides are elevated from hyperglycemia. -- continue Crestor '40mg'$  daily -consider Vascepa for triglycerides.     DM -- Hgb A1c 7.5 -- continue SSI  Consider SGLT2 inhibitor or GLP-1 agonist prior to discharge.   For questions or updates, please contact [place] Please consult [email or web address] for contact info under        Signed, Reino Bellis, NP  04/15/2022, 8:22 AM      ATTENDING ATTESTATION  I have seen, examined and evaluated the patient this p.m. along with Reino Bellis, NP-C .  After reviewing all the available data and chart, we discussed the patients laboratory, study & physical findings as well as symptoms in detail.  I agree with HER findings, examination as well as impression recommendations as per our discussion.    Attending adjustments noted in italics.   Non-STEMI found to have bifurcation LAD diagonal disease  with an ostial 80 lesion extending through the bifurcation into the diagonal.  Not favorable for PCI based on the length of LAD disease and bifurcation of the major diagonal branch.  Recommendation given his age and risk factors will be to proceed with CVTS consultation for CABG.  Awaiting formal read, it sounds like tentatively scheduled for July 9/28.  Remains on IV heparin, titrating meds accordingly.  As long as he is pain-free, will hold off on restarting nitroglycerin. Continue aspirin, statin and beta-blocker.  We will continue to monitor until he is scheduled for surgery.   Leonie Man, MD, MS Glenetta Hew, M.D., M.S. Interventional Cardiologist  [place]  Pager # [phone] Phone # [phone] [address]. [place], [place] [address]

## 2022-04-16 ENCOUNTER — Other Ambulatory Visit (HOSPITAL_COMMUNITY): Payer: Self-pay

## 2022-04-16 ENCOUNTER — Telehealth (HOSPITAL_COMMUNITY): Payer: Self-pay

## 2022-04-16 DIAGNOSIS — E1159 Type 2 diabetes mellitus with other circulatory complications: Secondary | ICD-10-CM | POA: Diagnosis not present

## 2022-04-16 DIAGNOSIS — I214 Non-ST elevation (NSTEMI) myocardial infarction: Secondary | ICD-10-CM | POA: Diagnosis not present

## 2022-04-16 DIAGNOSIS — I25110 Atherosclerotic heart disease of native coronary artery with unstable angina pectoris: Secondary | ICD-10-CM | POA: Diagnosis not present

## 2022-04-16 DIAGNOSIS — E1169 Type 2 diabetes mellitus with other specified complication: Secondary | ICD-10-CM | POA: Diagnosis not present

## 2022-04-16 LAB — GLUCOSE, CAPILLARY
Glucose-Capillary: 167 mg/dL — ABNORMAL HIGH (ref 70–99)
Glucose-Capillary: 168 mg/dL — ABNORMAL HIGH (ref 70–99)
Glucose-Capillary: 86 mg/dL (ref 70–99)
Glucose-Capillary: 154 mg/dL — ABNORMAL HIGH (ref 70–99)

## 2022-04-16 LAB — HEPARIN LEVEL (UNFRACTIONATED)
Heparin Unfractionated: 0.59 IU/mL (ref 0.30–0.70)
Heparin Unfractionated: 0.77 IU/mL — ABNORMAL HIGH (ref 0.30–0.70)
Heparin Unfractionated: 0.59 IU/mL (ref 0.30–0.70)

## 2022-04-16 LAB — CBC
HCT: 41.5 % (ref 39.0–52.0)
Hemoglobin: 14 g/dL (ref 13.0–17.0)
MCH: 28.2 pg (ref 26.0–34.0)
MCHC: 33.7 g/dL (ref 30.0–36.0)
MCV: 83.7 fL (ref 80.0–100.0)
Platelets: 220 10*3/uL (ref 150–400)
RBC: 4.96 MIL/uL (ref 4.22–5.81)
RDW: 13.4 % (ref 11.5–15.5)
WBC: 9.2 10*3/uL (ref 4.0–10.5)
nRBC: 0 % (ref 0.0–0.2)

## 2022-04-16 LAB — APTT: aPTT: 93 seconds — ABNORMAL HIGH (ref 24–36)

## 2022-04-16 LAB — ABO/RH: ABO/RH(D): B POS

## 2022-04-16 LAB — PREPARE RBC (CROSSMATCH)

## 2022-04-16 LAB — TSH: TSH: 3.869 u[IU]/mL (ref 0.350–4.500)

## 2022-04-16 LAB — LIPOPROTEIN A (LPA): Lipoprotein (a): 94.7 nmol/L — ABNORMAL HIGH (ref <75.0)

## 2022-04-16 MED ORDER — TRANEXAMIC ACID (OHS) BOLUS VIA INFUSION
15.0000 mg/kg | INTRAVENOUS | Status: AC
  Administered 2022-04-17: 1338 mg via INTRAVENOUS
  Filled 2022-04-16: qty 1338

## 2022-04-16 MED ORDER — CHLORHEXIDINE GLUCONATE CLOTH 2 % EX PADS
6.0000 | MEDICATED_PAD | Freq: Once | CUTANEOUS | Status: AC
  Administered 2022-04-16: 6 via TOPICAL

## 2022-04-16 MED ORDER — INSULIN REGULAR(HUMAN) IN NACL 100-0.9 UT/100ML-% IV SOLN
INTRAVENOUS | Status: AC
  Administered 2022-04-17: 3.4 [IU]/h via INTRAVENOUS
  Filled 2022-04-16: qty 100

## 2022-04-16 MED ORDER — DEXMEDETOMIDINE HCL IN NACL 400 MCG/100ML IV SOLN
0.1000 ug/kg/h | INTRAVENOUS | Status: AC
  Administered 2022-04-17: .5 ug/kg/h via INTRAVENOUS
  Filled 2022-04-16: qty 100

## 2022-04-16 MED ORDER — HEPARIN 30,000 UNITS/1000 ML (OHS) CELLSAVER SOLUTION
Status: DC
  Filled 2022-04-16: qty 1000

## 2022-04-16 MED ORDER — EPINEPHRINE HCL 5 MG/250ML IV SOLN IN NS
0.0000 ug/min | INTRAVENOUS | Status: DC
  Filled 2022-04-16: qty 250

## 2022-04-16 MED ORDER — METOPROLOL TARTRATE 12.5 MG HALF TABLET
12.5000 mg | ORAL_TABLET | Freq: Once | ORAL | Status: AC
  Administered 2022-04-17: 12.5 mg via ORAL
  Filled 2022-04-16: qty 1

## 2022-04-16 MED ORDER — PHENYLEPHRINE HCL-NACL 20-0.9 MG/250ML-% IV SOLN
30.0000 ug/min | INTRAVENOUS | Status: AC
  Administered 2022-04-17: 50 ug/min via INTRAVENOUS
  Filled 2022-04-16: qty 250

## 2022-04-16 MED ORDER — CEFAZOLIN SODIUM-DEXTROSE 2-4 GM/100ML-% IV SOLN
2.0000 g | INTRAVENOUS | Status: DC
  Filled 2022-04-16: qty 100

## 2022-04-16 MED ORDER — CHLORHEXIDINE GLUCONATE 0.12 % MT SOLN
15.0000 mL | Freq: Once | OROMUCOSAL | Status: AC
  Administered 2022-04-17: 15 mL via OROMUCOSAL
  Filled 2022-04-16: qty 15

## 2022-04-16 MED ORDER — VANCOMYCIN HCL 1500 MG/300ML IV SOLN
1500.0000 mg | INTRAVENOUS | Status: AC
  Administered 2022-04-17: 1500 mg via INTRAVENOUS
  Filled 2022-04-16: qty 300

## 2022-04-16 MED ORDER — BISACODYL 5 MG PO TBEC
5.0000 mg | DELAYED_RELEASE_TABLET | Freq: Once | ORAL | Status: AC
  Administered 2022-04-16: 5 mg via ORAL
  Filled 2022-04-16: qty 1

## 2022-04-16 MED ORDER — POTASSIUM CHLORIDE 2 MEQ/ML IV SOLN
80.0000 meq | INTRAVENOUS | Status: DC
  Filled 2022-04-16: qty 40

## 2022-04-16 MED ORDER — NOREPINEPHRINE 4 MG/250ML-% IV SOLN
0.0000 ug/min | INTRAVENOUS | Status: AC
  Administered 2022-04-17: 1 ug/min via INTRAVENOUS
  Filled 2022-04-16: qty 250

## 2022-04-16 MED ORDER — PLASMA-LYTE A IV SOLN
INTRAVENOUS | Status: DC
  Filled 2022-04-16: qty 2.5

## 2022-04-16 MED ORDER — MILRINONE LACTATE IN DEXTROSE 20-5 MG/100ML-% IV SOLN
0.3000 ug/kg/min | INTRAVENOUS | Status: DC
  Filled 2022-04-16: qty 100

## 2022-04-16 MED ORDER — TRANEXAMIC ACID (OHS) PUMP PRIME SOLUTION
2.0000 mg/kg | INTRAVENOUS | Status: DC
  Filled 2022-04-16: qty 1.78

## 2022-04-16 MED ORDER — TRANEXAMIC ACID 1000 MG/10ML IV SOLN
1.5000 mg/kg/h | INTRAVENOUS | Status: AC
  Administered 2022-04-17: 1.5 mg/kg/h via INTRAVENOUS
  Filled 2022-04-16: qty 25

## 2022-04-16 MED ORDER — CEFAZOLIN SODIUM-DEXTROSE 2-4 GM/100ML-% IV SOLN
2.0000 g | INTRAVENOUS | Status: AC
  Administered 2022-04-17 (×2): 2 g via INTRAVENOUS
  Filled 2022-04-16: qty 100

## 2022-04-16 MED ORDER — CHLORHEXIDINE GLUCONATE CLOTH 2 % EX PADS
6.0000 | MEDICATED_PAD | Freq: Once | CUTANEOUS | Status: AC
  Administered 2022-04-17: 6 via TOPICAL

## 2022-04-16 MED ORDER — TEMAZEPAM 15 MG PO CAPS
15.0000 mg | ORAL_CAPSULE | Freq: Once | ORAL | Status: AC | PRN
  Administered 2022-04-16: 15 mg via ORAL
  Filled 2022-04-16: qty 1

## 2022-04-16 MED ORDER — MANNITOL 20 % IV SOLN
INTRAVENOUS | Status: DC
  Filled 2022-04-16: qty 13

## 2022-04-16 NOTE — Progress Notes (Signed)
ANTICOAGULATION CONSULT NOTE- follow-up  Pharmacy Consult for Heparin Indication: chest pain/ACS, s/p cath, pending CABG evaluation   Allergies  Allergen Reactions   Penicillins Other (See Comments)    Childhood allergy    Patient Measurements: Height: '5\' 9"'$  (175.3 cm) Weight: 89.2 kg (196 lb 9.6 oz) IBW/kg (Calculated) : 70.7 Heparin Dosing Weight: 89.1 kg   Vital Signs: Temp: 98.2 F (36.8 C) (09/27 0500) Temp Source: Oral (09/27 0500) BP: 116/76 (09/27 0500) Pulse Rate: 58 (09/27 0500)  Labs: Recent Labs    04/13/22 1456 04/13/22 1650 04/13/22 2214 04/14/22 0220 04/14/22 0703 04/15/22 0234 04/15/22 1211 04/15/22 1919 04/16/22 0307 04/16/22 0837  HGB 14.9  --   --  14.5  --  13.4  --   --  14.0  --   HCT 43.9  --   --  42.6  --  39.0  --   --  41.5  --   PLT 290  --   --  257  --  229  --   --  220  --   LABPROT 12.7  --   --   --   --   --   --   --   --   --   INR 1.0  --   --   --   --   --   --   --   --   --   HEPARINUNFRC  --   --  0.13*  --    < > 0.15*   < > 0.25* 0.59 0.77*  CREATININE 1.01  --   --  1.22  --  1.03  --   --   --   --   TROPONINIHS 160* 286* 608* 612*  --   --   --   --   --   --    < > = values in this interval not displayed.     Estimated Creatinine Clearance: 80 mL/min (by C-G formula based on SCr of 1.03 mg/dL).   Medical History: Past Medical History:  Diagnosis Date   Melanoma ([place]) 04/07/2019   right fore arm MIS TX EXC   SCC (squamous cell carcinoma) 06/30/2016   RIGHT DORSAL HAND CX3 5FU   SCC (squamous cell carcinoma) 06/30/2016   LEFT HAND TX CX3 5FU   Squamous cell carcinoma of skin 12/2015   RIGHT TEMPLE CX3 5FU    Medications:  Medications Prior to Admission  Medication Sig Dispense Refill Last Dose   ascorbic acid (VITAMIN C) 500 MG tablet Take 500 mg by mouth daily.   04/13/2022   Cholecalciferol (VITAMIN D3) 50 MCG (2000 UT) TABS Take 1 tablet by mouth daily.   04/13/2022   fluorouracil (EFUDEX) 5 % cream  APPLY TO AFFECTED AREA(S) AT BEDTIME AS DIRECTED (Patient taking differently: Apply 1 Application topically daily as needed (for rash).) 40 g 1 unknown   Multiple Vitamin (MULITIVITAMIN WITH MINERALS) TABS Take 1 tablet by mouth daily.   04/12/2022   omega-3 acid ethyl esters (LOVAZA) 1 g capsule Take 1 g by mouth 2 (two) times daily.   04/13/2022   saw palmetto 500 MG capsule Take 500 mg by mouth in the morning, at noon, in the evening, and at bedtime.   04/13/2022   Turmeric (QC TUMERIC COMPLEX) 500 MG CAPS Take 1 capsule by mouth daily.   04/13/2022   zinc gluconate 50 MG tablet Take 50 mg by mouth daily.   04/13/2022   terbinafine (LAMISIL)  250 MG tablet Take 1 tablet (250 mg total) by mouth daily. (Patient not taking: Reported on 01/28/2021) 30 tablet 3 Not Taking     Assessment: 26 yom presenting with chest pain. Heparin per pharmacy consult placed for chest pain/ACS. Patient is not on anticoagulation prior to arrival. Patient has returned to unit s/p planned cardiac cath. Order received to restart the heparin infusion without a bolus 2 hours after the sheath has been removed. Removal was noted at 16:18 on 9/25.   Heparin level early this morning was supratherapeutic at 0.77, on 2000 units/hr. CBC stable, unchanged--Hgb 14, plt 220. No line issues or signs/symptoms of bleeding reported.  Goal of Therapy:  Heparin level 0.3-0.7 units/ml Monitor platelets by anticoagulation protocol: Yes   Plan:  Decrease heparin gtt to 1900 units/hr  F/u heparin level in 6hrs  Monitor heparin level, CBC, and s/sx of bleeding daily  CABG tentatively planned for Thursday, 9/28    Billey Gosling, PharmD PGY1 Pharmacy Resident 9/27/20239:37 AM

## 2022-04-16 NOTE — TOC Initial Note (Signed)
Transition of Care [facility]) - Initial/Assessment Note    Patient Details  Name: Dustin Wood MRN: [id number] Date of Birth: [birth year]  [facility] CM/SW Contact:    Bethena Roys, RN Phone Number: 04/16/2022, 3:36 PM  Clinical Narrative:  Patient presented for chest pain. Plan for CABG 04-17-22. PTA patient was independent from home with spouse. Case Manager will continue to monitor for additional transition of care needs as the patient progresses.                 Expected Discharge Plan: Home/Self Care Barriers to Discharge: Continued Medical Work up   Patient Goals and CMS Choice Patient states their goals for this hospitalization and ongoing recovery are:: to return home.   Choice offered to / list presented to : NA  Expected Discharge Plan and Services Expected Discharge Plan: Home/Self Care In-house Referral: NA Discharge Planning Services: CM Consult   Living arrangements for the past 2 months: Single Family Home                   DME Agency: NA       HH Arranged: NA          Prior Living Arrangements/Services Living arrangements for the past 2 months: Single Family Home Lives with:: Spouse Patient language and need for interpreter reviewed:: Yes        Need for Family Participation in Patient Care: Yes (Comment) Care giver support system in place?: Yes (comment)   Criminal Activity/Legal Involvement Pertinent to Current Situation/Hospitalization: No - Comment as needed  Activities of Daily Living Home Assistive Devices/Equipment: None ADL Screening (condition at time of admission) Patient's cognitive ability adequate to safely complete daily activities?: Yes Is the patient deaf or have difficulty hearing?: No Does the patient have difficulty seeing, even when wearing glasses/contacts?: No Does the patient have difficulty concentrating, remembering, or making decisions?: No Patient able to express need for assistance with ADLs?:  Yes Does the patient have difficulty dressing or bathing?: No Independently performs ADLs?: Yes (appropriate for developmental age) Does the patient have difficulty walking or climbing stairs?: No Weakness of Legs: None Weakness of Arms/Hands: None  Permission Sought/Granted Permission sought to share information with : Family Supports, Case Manager     Emotional Assessment Appearance:: Appears stated age Attitude/Demeanor/Rapport: Engaged Affect (typically observed): Appropriate Orientation: : Oriented to Self, Oriented to Place, Oriented to  Time, Oriented to Situation Alcohol / Substance Use: Not Applicable Psych Involvement: No (comment)  Admission diagnosis:  NSTEMI (non-ST elevated myocardial infarction) [facility]) [I21.4] Patient Active Problem List   Diagnosis Date Noted   NSTEMI (non-ST elevated myocardial infarction) ([place]) 04/13/2022   PCP:  Susy Frizzle, MD Pharmacy:   [facility], [place] - [address] [place] [address] Phone: [phone] Fax: [place], [place] [place] [address] Phone: [phone] Fax: [phone]   Readmission Risk Interventions     No data to display

## 2022-04-16 NOTE — Progress Notes (Signed)
     [address]       [place] [address]             [phone]       No events OR tomorrow for CABG 2 with L radial artery harvest  [place]

## 2022-04-16 NOTE — TOC Benefit Eligibility Note (Signed)
Patient Teacher, English as a foreign language completed.    The current 30 day co-pay is, Farxiga $15.00.   The current 30 day co-pay is, Jardiance '10mg'$  $47.00.  The patient is insured through  [employer] (ADV)  Otis Brace, [place] Patient Advocate Specialist [place] Patient Advocate Team Direct Number: [phone]  Fax: [phone]

## 2022-04-16 NOTE — Progress Notes (Addendum)
Rounding Note    Patient Name: Dustin Wood Date of Encounter: 04/16/2022  [place] Cardiologist: Glenetta Hew, MD   Subjective   Feeling well. No chest pain, sob or palpitations.   CABG tomorrow  Inpatient Medications    Scheduled Meds:  aspirin EC  81 mg Oral Daily   Chlorhexidine Gluconate Cloth  6 each Topical Q0600   insulin aspart  0-5 Units Subcutaneous QHS   insulin aspart  0-9 Units Subcutaneous TID WC   metoprolol tartrate  12.5 mg Oral BID   mupirocin ointment  1 Application Nasal BID   rosuvastatin  40 mg Oral Daily   sodium chloride flush  3 mL Intravenous Q12H   Continuous Infusions:  sodium chloride     heparin 2,000 Units/hr (04/15/22 2112)   PRN Meds: sodium chloride, acetaminophen, morphine injection, nitroGLYCERIN, ondansetron (ZOFRAN) IV   Vital Signs    Vitals:   04/15/22 0847 04/15/22 1521 04/15/22 2030 04/16/22 0500  BP: 122/66 124/72 126/67 116/76  Pulse: 74 69 66 (!) 58  Resp: '18 18 18 18  '$ Temp: 98 F (36.7 C) 98.5 F (36.9 C) 98.4 F (36.9 C) 98.2 F (36.8 C)  TempSrc: Oral Oral Oral Oral  SpO2: 95% 95% 93% 95%  Weight:      Height:        Intake/Output Summary (Last 24 hours) at 04/16/2022 0856 Last data filed at 04/15/2022 1852 Gross per 24 hour  Intake 835.57 ml  Output --  Net 835.57 ml      04/15/2022    5:00 AM 04/13/2022    2:19 PM 03/27/2022    3:38 PM  Last 3 Weights  Weight (lbs) 196 lb 9.6 oz 200 lb 200 lb  Weight (kg) 89.177 kg 90.719 kg 90.719 kg      Telemetry    Sinus bradycardia/rhythm - Personally Reviewed  ECG    N/a  Physical Exam   GEN: No acute distress.   Neck: No JVD Cardiac: RRR, no murmurs, rubs, or gallops.  Respiratory: Clear to auscultation bilaterally. GI: Soft, nontender, non-distended  MS: No edema; No deformity. Neuro:  Nonfocal  Psych: Normal affect   Labs    High Sensitivity Troponin:   Recent Labs  Lab 04/13/22 1456 04/13/22 1650 04/13/22 2214  04/14/22 0220  TROPONINIHS 160* 286* 608* 612*     Chemistry Recent Labs  Lab 04/13/22 1456 04/14/22 0220 04/15/22 0234  NA 136 138 136  K 3.8 4.0 4.0  CL 105 102 106  CO2 '24 25 24  '$ GLUCOSE 167* 137* 126*  BUN '17 16 13  '$ CREATININE 1.01 1.22 1.03  CALCIUM 8.5* 9.0 8.8*  MG  --  2.2  --   PROT  --  6.5  --   ALBUMIN  --  3.3*  --   AST  --  25  --   ALT  --  30  --   ALKPHOS  --  48  --   BILITOT  --  0.6  --   GFRNONAA >60 >60 >60  ANIONGAP '7 11 6    '$ Lipids  Recent Labs  Lab 04/14/22 0220  CHOL 173  TRIG 349*  HDL 26*  LDLCALC 77  CHOLHDL 6.7    Hematology Recent Labs  Lab 04/14/22 0220 04/15/22 0234 04/16/22 0307  WBC 8.7 7.6 9.2  RBC 5.14 4.68 4.96  HGB 14.5 13.4 14.0  HCT 42.6 39.0 41.5  MCV 82.9 83.3 83.7  MCH 28.2 28.6 28.2  MCHC 34.0 34.4 33.7  RDW 13.5 13.5 13.4  PLT 257 229 220   Thyroid  Recent Labs  Lab 04/16/22 0307  TSH 3.869    BNPNo results for input(s): "BNP", "PROBNP" in the last 168 hours.  DDimer No results for input(s): "DDIMER" in the last 168 hours.   Radiology    VAS US DOPPLER PRE CABG: Result Date: 04/15/2022  Summary: Right Carotid: The extracranial vessels were near-normal with only minimal wall thickening or plaque. Left Carotid: Velocities in the left ICA are consistent with a 1-39% stenosis. Vertebrals:  Bilateral vertebral arteries demonstrate antegrade flow. Subclavians: Normal flow hemodynamics were seen in bilateral subclavian              arteries. Right ABI: Resting right ankle-brachial index is within normal range. The right toe-brachial index is normal. Left ABI: Resting left ankle-brachial index is within normal range. The left toe-brachial index is normal. Bilateral Extremity: Doppler waveforms remain within normal limits with compression bilaterally for the radial arteries. Doppler waveforms remain within normal limits with compression bilaterally for the ulnar arteries.  Electronically signed by Deitra Mayo MD on 04/15/2022 at 7:29:09 PM.    Final    CARDIAC CATHETERIZATION: Result Date: 04/14/2022 Mr. 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  His RCA and circumflex are free of significant obstructive disease.  His LV function is normal as is his LVEDP.  His anatomy is not suitable for percutaneous intervention.  He will need CABG.  I have reviewed his anatomy with Dr. Ellyn Hack who agrees.  We will will reheparinize him 2 hours after sheath removal.  He left the lab in stable condition. Quay Burow. MD, [facility] 04/14/2022 4:30 PM    ECHOCARDIOGRAM COMPLETE: Result Date: 04/14/2022  IMPRESSIONS  1. Left ventricular ejection fraction, by estimation, is 50 to 55%. The left ventricle has low normal function. The left ventricle demonstrates regional wall motion abnormalities (see scoring diagram/findings for description). Left ventricular diastolic  parameters are consistent with Grade I diastolic dysfunction (impaired relaxation). There is incoordinate septal motion. There is mild hypokinesis of the left ventricular, basal-mid inferior wall and inferolateral wall.  2. Right ventricular systolic function is low normal. The right ventricular size is normal.  3. The mitral valve is grossly normal. Trivial mitral valve regurgitation.  4. The aortic valve is tricuspid. Aortic valve regurgitation is not visualized. Aortic valve sclerosis/calcification is present, without any evidence of aortic stenosis. Comparison(s): No prior Echocardiogram.   Cardiac Studies-Summary   Echo 04/14/22: EF 50 to 55%.  Incoordinated septal motion with mild HK of the basal to mid inferior wall and inferolateral wall.  Normal RV.  Grossly normal MV.  Mild aortic valve sclerosis with no stenosis noted.  LEFT HEART CATH AND CORONARY ANGIOGRAPHY  04/14/22   Conclusion:   Ost LAD to Prox LAD lesion is 95% stenosed.  1st Diag lesion is 95% stenosed.  The left  ventricular systolic function is normal.  LV end diastolic pressure is normal.  The left ventricular ejection fraction is 50-55% by visual estimate.  Diagnostic   Dominance: Right   Patient Profile     66 y.o. male PMH of psoriasis who presented with chest pain and found to have a NSTEMI.  Assessment & Plan    NSTEMI -- hsTn 160>>286>>612. Initial EKG without ischemia but follow up EKG showed biphasic TW in anterior leads. Underwent cardiac cath with ostial LAD lesion of 95%, along  with 1st diag lesion of 95%. Seen by TCTS and plan to 2V CABG tomorrow.  -- continue IV heparin, ASA, Crestor '40mg'$  daily, and low dose metoprolol  -- Echo showed LVEF of 50-55%, g1DD, with regional WM abnormality    Hypertriglyceridemia -- 04/14/2022: Cholesterol 173; HDL 26; LDL Cholesterol 77; Triglycerides 349; VLDL 70 >>Suspect the triglycerides are elevated from hyperglycemia. -- Continue Crestor '40mg'$  daily -consider Vascepa for triglycerides.     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  For questions or updates, please contact [place] Please consult [email or web address] for contact info under    Signed, Leanor Kail, PA  04/16/2022, 8:56 AM      ATTENDING ATTESTATION  I have seen, examined and evaluated the patient this along with Mr. [place]ly [place], [place] after reviewing all the available data and chart, we discussed the patients laboratory, study & physical findings as well as symptoms in detail.  I agree with his findings, examination as well as impression recommendations as per our discussion.    Doing well.  No major issues.  Undergoing pre-CABG work-up.  Stable pending CABG.    Leonie Man, MD, MS Glenetta Hew, M.D., M.S. Interventional Cardiologist  [place]  Pager # [phone] Phone # [phone] [address]  [place], [place] [address]

## 2022-04-16 NOTE — Anesthesia Preprocedure Evaluation (Signed)
Anesthesia Evaluation  Patient identified by MRN, date of birth, ID band Patient awake    Reviewed: Allergy & Precautions, NPO status   Airway Mallampati: II       Dental   Pulmonary  Hx noted Dr. Nyoka Cowden   breath sounds clear to auscultation       Cardiovascular + Past MI   Rhythm:Regular Rate:Normal  Hx noted Dr. Nyoka Cowden   Neuro/Psych negative neurological ROS     GI/Hepatic negative GI ROS, Neg liver ROS,   Endo/Other  negative endocrine ROS  Renal/GU negative Renal ROS     Musculoskeletal   Abdominal   Peds  Hematology   Anesthesia Other Findings   Reproductive/Obstetrics                            Anesthesia Physical Anesthesia Plan  ASA: 3  Anesthesia Plan: General   Post-op Pain Management:    Induction: Intravenous  PONV Risk Score and Plan: 3 and Ondansetron, Dexamethasone, Midazolam and Treatment may vary due to age or medical condition  Airway Management Planned: Oral ETT  Additional Equipment: Arterial line, CVP and Ultrasound Guidance Line Placement  Intra-op Plan:   Post-operative Plan: Post-operative intubation/ventilation  Informed Consent: I have reviewed the patients History and Physical, chart, labs and discussed the procedure including the risks, benefits and alternatives for the proposed anesthesia with the patient or authorized representative who has indicated his/her understanding and acceptance.     Dental advisory given  Plan Discussed with: Anesthesiologist and CRNA  Anesthesia Plan Comments:        Anesthesia Quick Evaluation

## 2022-04-16 NOTE — Progress Notes (Signed)
ANTICOAGULATION CONSULT NOTE- follow-up  Pharmacy Consult for Heparin Indication: chest pain/ACS, s/p cath, pending CABG evaluation   Allergies  Allergen Reactions   Penicillins Other (See Comments)    Childhood allergy    Patient Measurements: Height: '5\' 9"'$  (175.3 cm) Weight: 89.2 kg (196 lb 9.6 oz) IBW/kg (Calculated) : 70.7 Heparin Dosing Weight: 89.1 kg   Vital Signs: Temp: 98.2 F (36.8 C) (09/27 0500) Temp Source: Oral (09/27 0500) BP: 116/76 (09/27 0500) Pulse Rate: 58 (09/27 0500)  Labs: Recent Labs    04/13/22 1456 04/13/22 1650 04/13/22 2214 04/14/22 0220 04/14/22 0703 04/15/22 0234 04/15/22 1211 04/15/22 1919 04/16/22 0307  HGB 14.9  --   --  14.5  --  13.4  --   --  14.0  HCT 43.9  --   --  42.6  --  39.0  --   --  41.5  PLT 290  --   --  257  --  229  --   --  220  LABPROT 12.7  --   --   --   --   --   --   --   --   INR 1.0  --   --   --   --   --   --   --   --   HEPARINUNFRC  --   --  0.13*  --    < > 0.15* 0.24* 0.25* 0.59  CREATININE 1.01  --   --  1.22  --  1.03  --   --   --   TROPONINIHS 160* 286* 608* 612*  --   --   --   --   --    < > = values in this interval not displayed.     Estimated Creatinine Clearance: 80 mL/min (by C-G formula based on SCr of 1.03 mg/dL).   Medical History: Past Medical History:  Diagnosis Date   Melanoma ([place]) 04/07/2019   right fore arm MIS TX EXC   SCC (squamous cell carcinoma) 06/30/2016   RIGHT DORSAL HAND CX3 5FU   SCC (squamous cell carcinoma) 06/30/2016   LEFT HAND TX CX3 5FU   Squamous cell carcinoma of skin 12/2015   RIGHT TEMPLE CX3 5FU    Medications:  Medications Prior to Admission  Medication Sig Dispense Refill Last Dose   ascorbic acid (VITAMIN C) 500 MG tablet Take 500 mg by mouth daily.   04/13/2022   Cholecalciferol (VITAMIN D3) 50 MCG (2000 UT) TABS Take 1 tablet by mouth daily.   04/13/2022   fluorouracil (EFUDEX) 5 % cream APPLY TO AFFECTED AREA(S) AT BEDTIME AS DIRECTED  (Patient taking differently: Apply 1 Application topically daily as needed (for rash).) 40 g 1 unknown   Multiple Vitamin (MULITIVITAMIN WITH MINERALS) TABS Take 1 tablet by mouth daily.   04/12/2022   omega-3 acid ethyl esters (LOVAZA) 1 g capsule Take 1 g by mouth 2 (two) times daily.   04/13/2022   saw palmetto 500 MG capsule Take 500 mg by mouth in the morning, at noon, in the evening, and at bedtime.   04/13/2022   Turmeric (QC TUMERIC COMPLEX) 500 MG CAPS Take 1 capsule by mouth daily.   04/13/2022   zinc gluconate 50 MG tablet Take 50 mg by mouth daily.   04/13/2022   terbinafine (LAMISIL) 250 MG tablet Take 1 tablet (250 mg total) by mouth daily. (Patient not taking: Reported on 01/28/2021) 30 tablet 3 Not Taking  Assessment: 46 yom presenting with chest pain. Heparin per pharmacy consult placed for chest pain/ACS. Patient is not on anticoagulation prior to arrival. Patient has returned to unit s/p planned cardiac cath. Order received to restart the heparin infusion without a bolus 2 hours after the sheath has been removed. Removal was noted at 16:18 on 9/25.   Heparin level early this morning was therapeutic at 0.59, on 2000 units/hr. Hgb 14, plt 220--stable. No line issues or signs/symptoms of bleeding reported.  Goal of Therapy:  Heparin level 0.3-0.7 units/ml Monitor platelets by anticoagulation protocol: Yes   Plan:  Continue heparin gtt @ 2000 units/hr F/u heparin level 6hrs from most recent level  Monitor heparin level, CBC, and s/sx of bleeding daily  CABG tentatively planned for Thursday, 9/28    Billey Gosling, PharmD PGY1 Pharmacy Resident 9/27/20237:33 AM

## 2022-04-16 NOTE — Progress Notes (Signed)
Mobility Specialist - Progress Note   04/16/22 1020  Mobility  Activity Ambulated independently in hallway  Level of Assistance Independent  Assistive Device Other (Comment) (Iv pole)  Distance Ambulated (ft) 470 ft  Activity Response Tolerated well  $Mobility charge 1 Mobility   Pt was received walking in hallway. Pt had no complaints throughout ambulation. Pt was returned to room with all needs met.  Larey Seat

## 2022-04-16 NOTE — Telephone Encounter (Signed)
Pharmacy Patient Advocate Encounter  Insurance verification completed.    The patient is insured through [employer] (ADV)   The patient is currently admitted and ran test claims for the following: [place] and [employer].  Copays and coinsurance results were relayed to Inpatient clinical team.

## 2022-04-16 NOTE — Progress Notes (Signed)
ANTICOAGULATION CONSULT NOTE- follow-up  Pharmacy Consult for Heparin Indication: chest pain/ACS, s/p cath, pending CABG evaluation   Allergies  Allergen Reactions   Penicillins Other (See Comments)    Childhood allergy    Patient Measurements: Height: '5\' 9"'$  (175.3 cm) Weight: 89.2 kg (196 lb 9.6 oz) IBW/kg (Calculated) : 70.7 Heparin Dosing Weight: 89.1 kg   Vital Signs: Temp: 98.1 F (36.7 C) (09/27 1205) Temp Source: Oral (09/27 1205) BP: 121/79 (09/27 1205) Pulse Rate: 73 (09/27 1205)  Labs: Recent Labs     0000 04/13/22 2214 04/14/22 0220 04/14/22 0703 04/15/22 0234 04/15/22 1211 04/16/22 0307 04/16/22 0837 04/16/22 1611  HGB   < >  --  14.5  --  13.4  --  14.0  --   --   HCT  --   --  42.6  --  39.0  --  41.5  --   --   PLT  --   --  257  --  229  --  220  --   --   HEPARINUNFRC  --  0.13*  --    < > 0.15*   < > 0.59 0.77* 0.59  CREATININE  --   --  1.22  --  1.03  --   --   --   --   TROPONINIHS  --  608* 612*  --   --   --   --   --   --    < > = values in this interval not displayed.     Estimated Creatinine Clearance: 80 mL/min (by C-G formula based on SCr of 1.03 mg/dL).   Medical History: Past Medical History:  Diagnosis Date   Melanoma ([place]) 04/07/2019   right fore arm MIS TX EXC   SCC (squamous cell carcinoma) 06/30/2016   RIGHT DORSAL HAND CX3 5FU   SCC (squamous cell carcinoma) 06/30/2016   LEFT HAND TX CX3 5FU   Squamous cell carcinoma of skin 12/2015   RIGHT TEMPLE CX3 5FU    Medications:  Medications Prior to Admission  Medication Sig Dispense Refill Last Dose   ascorbic acid (VITAMIN C) 500 MG tablet Take 500 mg by mouth daily.   04/13/2022   Cholecalciferol (VITAMIN D3) 50 MCG (2000 UT) TABS Take 1 tablet by mouth daily.   04/13/2022   fluorouracil (EFUDEX) 5 % cream APPLY TO AFFECTED AREA(S) AT BEDTIME AS DIRECTED (Patient taking differently: Apply 1 Application topically daily as needed (for rash).) 40 g 1 unknown   Multiple  Vitamin (MULITIVITAMIN WITH MINERALS) TABS Take 1 tablet by mouth daily.   04/12/2022   omega-3 acid ethyl esters (LOVAZA) 1 g capsule Take 1 g by mouth 2 (two) times daily.   04/13/2022   saw palmetto 500 MG capsule Take 500 mg by mouth in the morning, at noon, in the evening, and at bedtime.   04/13/2022   Turmeric (QC TUMERIC COMPLEX) 500 MG CAPS Take 1 capsule by mouth daily.   04/13/2022   zinc gluconate 50 MG tablet Take 50 mg by mouth daily.   04/13/2022   terbinafine (LAMISIL) 250 MG tablet Take 1 tablet (250 mg total) by mouth daily. (Patient not taking: Reported on 01/28/2021) 30 tablet 3 Not Taking     Assessment: 43 yom presenting with chest pain. Heparin per pharmacy consult placed for chest pain/ACS. Patient is not on anticoagulation prior to arrival. Patient has returned to unit s/p planned cardiac cath. Order received to restart the heparin infusion without  a bolus 2 hours after the sheath has been removed. Removal was noted at 16:18 on 9/25.   Most recent heparin level therapeutic at 0.59. CBC stable. No line issues or signs/symptoms of bleeding reported.  Goal of Therapy:  Heparin level 0.3-0.7 units/ml Monitor platelets by anticoagulation protocol: Yes   Plan:  Continue heparin gtt to 1900 units/hr  Re-check heparin level in 6 hours to ensure therapeutic.  Monitor heparin level, CBC, and s/sx of bleeding daily  CABG tentatively planned for Thursday, 9/28  Esmeralda Arthur, PharmD 9/27/20234:55 PM

## 2022-04-16 NOTE — Progress Notes (Signed)
CARDIAC REHAB PHASE I   Pt resting in bed feeling well this morning. He has just walked with mobility team and tolerated well. All questions and concerns regarding teaching provided yesterday addressed. Plan for surgery tomorrow morning. Will continue to follow.   [id number]  Vanessa Barbara, RN BSN 04/16/2022 10:58 AM

## 2022-04-17 ENCOUNTER — Other Ambulatory Visit: Payer: Self-pay

## 2022-04-17 ENCOUNTER — Inpatient Hospital Stay (HOSPITAL_COMMUNITY)
Admission: EM | Disposition: A | Discharge status: Home / Self Care | Payer: Self-pay | Source: Home / Self Care | Attending: Thoracic Surgery (Cardiothoracic Vascular Surgery)

## 2022-04-17 ENCOUNTER — Encounter (HOSPITAL_COMMUNITY): Payer: Self-pay | Admitting: Thoracic Surgery (Cardiothoracic Vascular Surgery)

## 2022-04-17 ENCOUNTER — Inpatient Hospital Stay (HOSPITAL_COMMUNITY): Payer: BC Managed Care – PPO | Admitting: Certified Registered Nurse Anesthetist

## 2022-04-17 ENCOUNTER — Inpatient Hospital Stay (HOSPITAL_COMMUNITY): Payer: BC Managed Care – PPO

## 2022-04-17 DIAGNOSIS — Z9911 Dependence on respirator [ventilator] status: Secondary | ICD-10-CM

## 2022-04-17 DIAGNOSIS — I2510 Atherosclerotic heart disease of native coronary artery without angina pectoris: Secondary | ICD-10-CM | POA: Diagnosis not present

## 2022-04-17 DIAGNOSIS — Z951 Presence of aortocoronary bypass graft: Secondary | ICD-10-CM | POA: Diagnosis not present

## 2022-04-17 DIAGNOSIS — I214 Non-ST elevation (NSTEMI) myocardial infarction: Secondary | ICD-10-CM | POA: Diagnosis not present

## 2022-04-17 HISTORY — PX: RADIAL ARTERY HARVEST: SHX5067

## 2022-04-17 HISTORY — PX: CORONARY ARTERY BYPASS GRAFT: SHX141

## 2022-04-17 HISTORY — PX: TEE WITHOUT CARDIOVERSION: SHX5443

## 2022-04-17 LAB — CBC
HCT: 41.3 % (ref 39.0–52.0)
HCT: 36.3 % — ABNORMAL LOW (ref 39.0–52.0)
HCT: 34.3 % — ABNORMAL LOW (ref 39.0–52.0)
Hemoglobin: 14.2 g/dL (ref 13.0–17.0)
Hemoglobin: 12.4 g/dL — ABNORMAL LOW (ref 13.0–17.0)
Hemoglobin: 12 g/dL — ABNORMAL LOW (ref 13.0–17.0)
MCH: 28.6 pg (ref 26.0–34.0)
MCH: 28.8 pg (ref 26.0–34.0)
MCH: 28.7 pg (ref 26.0–34.0)
MCHC: 34.4 g/dL (ref 30.0–36.0)
MCHC: 34.2 g/dL (ref 30.0–36.0)
MCHC: 35 g/dL (ref 30.0–36.0)
MCV: 83.1 fL (ref 80.0–100.0)
MCV: 84.2 fL (ref 80.0–100.0)
MCV: 82.1 fL (ref 80.0–100.0)
Platelets: 232 10*3/uL (ref 150–400)
Platelets: 176 10*3/uL (ref 150–400)
Platelets: 227 10*3/uL (ref 150–400)
RBC: 4.97 MIL/uL (ref 4.22–5.81)
RBC: 4.31 MIL/uL (ref 4.22–5.81)
RBC: 4.18 MIL/uL — ABNORMAL LOW (ref 4.22–5.81)
RDW: 13.5 % (ref 11.5–15.5)
RDW: 13.2 % (ref 11.5–15.5)
RDW: 13 % (ref 11.5–15.5)
WBC: 9.2 10*3/uL (ref 4.0–10.5)
WBC: 21 10*3/uL — ABNORMAL HIGH (ref 4.0–10.5)
WBC: 22 10*3/uL — ABNORMAL HIGH (ref 4.0–10.5)
nRBC: 0 % (ref 0.0–0.2)
nRBC: 0 % (ref 0.0–0.2)
nRBC: 0 % (ref 0.0–0.2)

## 2022-04-17 LAB — POCT I-STAT, CHEM 8
BUN: 12 mg/dL (ref 8–23)
BUN: 12 mg/dL (ref 8–23)
BUN: 12 mg/dL (ref 8–23)
BUN: 13 mg/dL (ref 8–23)
BUN: 11 mg/dL (ref 8–23)
Calcium, Ion: 1.15 mmol/L (ref 1.15–1.40)
Calcium, Ion: 1.12 mmol/L — ABNORMAL LOW (ref 1.15–1.40)
Calcium, Ion: 1.03 mmol/L — ABNORMAL LOW (ref 1.15–1.40)
Calcium, Ion: 1.04 mmol/L — ABNORMAL LOW (ref 1.15–1.40)
Calcium, Ion: 1.22 mmol/L (ref 1.15–1.40)
Chloride: 104 mmol/L (ref 98–111)
Chloride: 105 mmol/L (ref 98–111)
Chloride: 103 mmol/L (ref 98–111)
Chloride: 106 mmol/L (ref 98–111)
Chloride: 107 mmol/L (ref 98–111)
Creatinine, Ser: 1 mg/dL (ref 0.61–1.24)
Creatinine, Ser: 0.9 mg/dL (ref 0.61–1.24)
Creatinine, Ser: 0.9 mg/dL (ref 0.61–1.24)
Creatinine, Ser: 0.9 mg/dL (ref 0.61–1.24)
Creatinine, Ser: 0.8 mg/dL (ref 0.61–1.24)
Glucose, Bld: 120 mg/dL — ABNORMAL HIGH (ref 70–99)
Glucose, Bld: 137 mg/dL — ABNORMAL HIGH (ref 70–99)
Glucose, Bld: 144 mg/dL — ABNORMAL HIGH (ref 70–99)
Glucose, Bld: 162 mg/dL — ABNORMAL HIGH (ref 70–99)
Glucose, Bld: 163 mg/dL — ABNORMAL HIGH (ref 70–99)
HCT: 41 % (ref 39.0–52.0)
HCT: 37 % — ABNORMAL LOW (ref 39.0–52.0)
HCT: 27 % — ABNORMAL LOW (ref 39.0–52.0)
HCT: 28 % — ABNORMAL LOW (ref 39.0–52.0)
HCT: 31 % — ABNORMAL LOW (ref 39.0–52.0)
Hemoglobin: 13.9 g/dL (ref 13.0–17.0)
Hemoglobin: 12.6 g/dL — ABNORMAL LOW (ref 13.0–17.0)
Hemoglobin: 9.2 g/dL — ABNORMAL LOW (ref 13.0–17.0)
Hemoglobin: 9.5 g/dL — ABNORMAL LOW (ref 13.0–17.0)
Hemoglobin: 10.5 g/dL — ABNORMAL LOW (ref 13.0–17.0)
Potassium: 4.6 mmol/L (ref 3.5–5.1)
Potassium: 4.3 mmol/L (ref 3.5–5.1)
Potassium: 5 mmol/L (ref 3.5–5.1)
Potassium: 5.2 mmol/L — ABNORMAL HIGH (ref 3.5–5.1)
Potassium: 4.3 mmol/L (ref 3.5–5.1)
Sodium: 137 mmol/L (ref 135–145)
Sodium: 136 mmol/L (ref 135–145)
Sodium: 136 mmol/L (ref 135–145)
Sodium: 137 mmol/L (ref 135–145)
Sodium: 138 mmol/L (ref 135–145)
TCO2: 22 mmol/L (ref 22–32)
TCO2: 20 mmol/L — ABNORMAL LOW (ref 22–32)
TCO2: 22 mmol/L (ref 22–32)
TCO2: 22 mmol/L (ref 22–32)
TCO2: 22 mmol/L (ref 22–32)

## 2022-04-17 LAB — BASIC METABOLIC PANEL
Anion gap: 11 (ref 5–15)
Anion gap: 12 (ref 5–15)
BUN: 14 mg/dL (ref 8–23)
BUN: 10 mg/dL (ref 8–23)
CO2: 24 mmol/L (ref 22–32)
CO2: 20 mmol/L — ABNORMAL LOW (ref 22–32)
Calcium: 9.2 mg/dL (ref 8.9–10.3)
Calcium: 7.8 mg/dL — ABNORMAL LOW (ref 8.9–10.3)
Chloride: 101 mmol/L (ref 98–111)
Chloride: 107 mmol/L (ref 98–111)
Creatinine, Ser: 1.22 mg/dL (ref 0.61–1.24)
Creatinine, Ser: 1.18 mg/dL (ref 0.61–1.24)
GFR, Estimated: >60 mL/min (ref >60)
GFR, Estimated: >60 mL/min (ref >60)
Glucose, Bld: 131 mg/dL — ABNORMAL HIGH (ref 70–99)
Glucose, Bld: 166 mg/dL — ABNORMAL HIGH (ref 70–99)
Potassium: 4 mmol/L (ref 3.5–5.1)
Potassium: 4.3 mmol/L (ref 3.5–5.1)
Sodium: 136 mmol/L (ref 135–145)
Sodium: 139 mmol/L (ref 135–145)

## 2022-04-17 LAB — POCT I-STAT 7, (LYTES, BLD GAS, ICA,H+H)
Acid-base deficit: 1 mmol/L (ref 0.0–2.0)
Acid-base deficit: 1 mmol/L (ref 0.0–2.0)
Acid-base deficit: 2 mmol/L (ref 0.0–2.0)
Acid-base deficit: 7 mmol/L — ABNORMAL HIGH (ref 0.0–2.0)
Acid-base deficit: 6 mmol/L — ABNORMAL HIGH (ref 0.0–2.0)
Acid-base deficit: 10 mmol/L — ABNORMAL HIGH (ref 0.0–2.0)
Acid-base deficit: 2 mmol/L (ref 0.0–2.0)
Acid-base deficit: 4 mmol/L — ABNORMAL HIGH (ref 0.0–2.0)
Acid-base deficit: 3 mmol/L — ABNORMAL HIGH (ref 0.0–2.0)
Bicarbonate: 26.4 mmol/L (ref 20.0–28.0)
Bicarbonate: 23.7 mmol/L (ref 20.0–28.0)
Bicarbonate: 23.7 mmol/L (ref 20.0–28.0)
Bicarbonate: 19.4 mmol/L — ABNORMAL LOW (ref 20.0–28.0)
Bicarbonate: 20.7 mmol/L (ref 20.0–28.0)
Bicarbonate: 17.9 mmol/L — ABNORMAL LOW (ref 20.0–28.0)
Bicarbonate: 23.6 mmol/L (ref 20.0–28.0)
Bicarbonate: 21.1 mmol/L (ref 20.0–28.0)
Bicarbonate: 21.9 mmol/L (ref 20.0–28.0)
Calcium, Ion: 1.26 mmol/L (ref 1.15–1.40)
Calcium, Ion: 0.98 mmol/L — ABNORMAL LOW (ref 1.15–1.40)
Calcium, Ion: 1.05 mmol/L — ABNORMAL LOW (ref 1.15–1.40)
Calcium, Ion: 1.22 mmol/L (ref 1.15–1.40)
Calcium, Ion: 1.22 mmol/L (ref 1.15–1.40)
Calcium, Ion: 0.93 mmol/L — ABNORMAL LOW (ref 1.15–1.40)
Calcium, Ion: 1.03 mmol/L — ABNORMAL LOW (ref 1.15–1.40)
Calcium, Ion: 1.08 mmol/L — ABNORMAL LOW (ref 1.15–1.40)
Calcium, Ion: 1.07 mmol/L — ABNORMAL LOW (ref 1.15–1.40)
HCT: 38 % — ABNORMAL LOW (ref 39.0–52.0)
HCT: 27 % — ABNORMAL LOW (ref 39.0–52.0)
HCT: 26 % — ABNORMAL LOW (ref 39.0–52.0)
HCT: 27 % — ABNORMAL LOW (ref 39.0–52.0)
HCT: 33 % — ABNORMAL LOW (ref 39.0–52.0)
HCT: 33 % — ABNORMAL LOW (ref 39.0–52.0)
HCT: 33 % — ABNORMAL LOW (ref 39.0–52.0)
HCT: 32 % — ABNORMAL LOW (ref 39.0–52.0)
HCT: 29 % — ABNORMAL LOW (ref 39.0–52.0)
Hemoglobin: 12.9 g/dL — ABNORMAL LOW (ref 13.0–17.0)
Hemoglobin: 9.2 g/dL — ABNORMAL LOW (ref 13.0–17.0)
Hemoglobin: 8.8 g/dL — ABNORMAL LOW (ref 13.0–17.0)
Hemoglobin: 9.2 g/dL — ABNORMAL LOW (ref 13.0–17.0)
Hemoglobin: 11.2 g/dL — ABNORMAL LOW (ref 13.0–17.0)
Hemoglobin: 11.2 g/dL — ABNORMAL LOW (ref 13.0–17.0)
Hemoglobin: 11.2 g/dL — ABNORMAL LOW (ref 13.0–17.0)
Hemoglobin: 10.9 g/dL — ABNORMAL LOW (ref 13.0–17.0)
Hemoglobin: 9.9 g/dL — ABNORMAL LOW (ref 13.0–17.0)
O2 Saturation: 100 %
O2 Saturation: 100 %
O2 Saturation: 100 %
O2 Saturation: 97 %
O2 Saturation: 90 %
O2 Saturation: 92 %
O2 Saturation: 92 %
O2 Saturation: 91 %
O2 Saturation: 92 %
Patient temperature: 97.6
Patient temperature: 36.5
Patient temperature: 37
Potassium: 4.6 mmol/L (ref 3.5–5.1)
Potassium: 4.3 mmol/L (ref 3.5–5.1)
Potassium: 5.1 mmol/L (ref 3.5–5.1)
Potassium: 4.3 mmol/L (ref 3.5–5.1)
Potassium: 4.4 mmol/L (ref 3.5–5.1)
Potassium: 4.6 mmol/L (ref 3.5–5.1)
Potassium: 4.2 mmol/L (ref 3.5–5.1)
Potassium: 4.2 mmol/L (ref 3.5–5.1)
Potassium: 4.2 mmol/L (ref 3.5–5.1)
Sodium: 136 mmol/L (ref 135–145)
Sodium: 138 mmol/L (ref 135–145)
Sodium: 137 mmol/L (ref 135–145)
Sodium: 139 mmol/L (ref 135–145)
Sodium: 137 mmol/L (ref 135–145)
Sodium: 140 mmol/L (ref 135–145)
Sodium: 141 mmol/L (ref 135–145)
Sodium: 139 mmol/L (ref 135–145)
Sodium: 138 mmol/L (ref 135–145)
TCO2: 28 mmol/L (ref 22–32)
TCO2: 25 mmol/L (ref 22–32)
TCO2: 25 mmol/L (ref 22–32)
TCO2: 21 mmol/L — ABNORMAL LOW (ref 22–32)
TCO2: 22 mmol/L (ref 22–32)
TCO2: 19 mmol/L — ABNORMAL LOW (ref 22–32)
TCO2: 25 mmol/L (ref 22–32)
TCO2: 22 mmol/L (ref 22–32)
TCO2: 23 mmol/L (ref 22–32)
pCO2 arterial: 52.9 mmHg — ABNORMAL HIGH (ref 32–48)
pCO2 arterial: 37.4 mmHg (ref 32–48)
pCO2 arterial: 44.8 mmHg (ref 32–48)
pCO2 arterial: 43.1 mmHg (ref 32–48)
pCO2 arterial: 43.7 mmHg (ref 32–48)
pCO2 arterial: 43.3 mmHg (ref 32–48)
pCO2 arterial: 43 mmHg (ref 32–48)
pCO2 arterial: 36.9 mmHg (ref 32–48)
pCO2 arterial: 36.8 mmHg (ref 32–48)
pH, Arterial: 7.306 — ABNORMAL LOW (ref 7.35–7.45)
pH, Arterial: 7.409 (ref 7.35–7.45)
pH, Arterial: 7.331 — ABNORMAL LOW (ref 7.35–7.45)
pH, Arterial: 7.261 — ABNORMAL LOW (ref 7.35–7.45)
pH, Arterial: 7.284 — ABNORMAL LOW (ref 7.35–7.45)
pH, Arterial: 7.222 — ABNORMAL LOW (ref 7.35–7.45)
pH, Arterial: 7.348 — ABNORMAL LOW (ref 7.35–7.45)
pH, Arterial: 7.364 (ref 7.35–7.45)
pH, Arterial: 7.383 (ref 7.35–7.45)
pO2, Arterial: 384 mmHg — ABNORMAL HIGH (ref 83–108)
pO2, Arterial: 350 mmHg — ABNORMAL HIGH (ref 83–108)
pO2, Arterial: 255 mmHg — ABNORMAL HIGH (ref 83–108)
pO2, Arterial: 108 mmHg (ref 83–108)
pO2, Arterial: 66 mmHg — ABNORMAL LOW (ref 83–108)
pO2, Arterial: 73 mmHg — ABNORMAL LOW (ref 83–108)
pO2, Arterial: 67 mmHg — ABNORMAL LOW (ref 83–108)
pO2, Arterial: 60 mmHg — ABNORMAL LOW (ref 83–108)
pO2, Arterial: 64 mmHg — ABNORMAL LOW (ref 83–108)

## 2022-04-17 LAB — BLOOD GAS, ARTERIAL
Acid-base deficit: 2.2 mmol/L — ABNORMAL HIGH (ref 0.0–2.0)
Bicarbonate: 22.4 mmol/L (ref 20.0–28.0)
Drawn by: 270271
O2 Saturation: 96.6 %
Patient temperature: 37.1
pCO2 arterial: 37 mmHg (ref 32–48)
pH, Arterial: 7.39 (ref 7.35–7.45)
pO2, Arterial: 76 mmHg — ABNORMAL LOW (ref 83–108)

## 2022-04-17 LAB — POCT I-STAT EG7
Acid-base deficit: 3 mmol/L — ABNORMAL HIGH (ref 0.0–2.0)
Bicarbonate: 23.1 mmol/L (ref 20.0–28.0)
Calcium, Ion: 1.03 mmol/L — ABNORMAL LOW (ref 1.15–1.40)
HCT: 28 % — ABNORMAL LOW (ref 39.0–52.0)
Hemoglobin: 9.5 g/dL — ABNORMAL LOW (ref 13.0–17.0)
O2 Saturation: 75 %
Potassium: 5.2 mmol/L — ABNORMAL HIGH (ref 3.5–5.1)
Sodium: 136 mmol/L (ref 135–145)
TCO2: 24 mmol/L (ref 22–32)
pCO2, Ven: 44.8 mmHg (ref 44–60)
pH, Ven: 7.32 (ref 7.25–7.43)
pO2, Ven: 43 mmHg (ref 32–45)

## 2022-04-17 LAB — GLUCOSE, CAPILLARY
Glucose-Capillary: 160 mg/dL — ABNORMAL HIGH (ref 70–99)
Glucose-Capillary: 135 mg/dL — ABNORMAL HIGH (ref 70–99)
Glucose-Capillary: 132 mg/dL — ABNORMAL HIGH (ref 70–99)
Glucose-Capillary: 166 mg/dL — ABNORMAL HIGH (ref 70–99)
Glucose-Capillary: 145 mg/dL — ABNORMAL HIGH (ref 70–99)
Glucose-Capillary: 168 mg/dL — ABNORMAL HIGH (ref 70–99)
Glucose-Capillary: 164 mg/dL — ABNORMAL HIGH (ref 70–99)
Glucose-Capillary: 196 mg/dL — ABNORMAL HIGH (ref 70–99)
Glucose-Capillary: 207 mg/dL — ABNORMAL HIGH (ref 70–99)
Glucose-Capillary: 147 mg/dL — ABNORMAL HIGH (ref 70–99)

## 2022-04-17 LAB — HEMOGLOBIN AND HEMATOCRIT, BLOOD
HCT: 28.3 % — ABNORMAL LOW (ref 39.0–52.0)
Hemoglobin: 9.7 g/dL — ABNORMAL LOW (ref 13.0–17.0)

## 2022-04-17 LAB — SARS CORONAVIRUS 2 BY RT PCR: SARS Coronavirus 2 by RT PCR: NEGATIVE

## 2022-04-17 LAB — HEPARIN LEVEL (UNFRACTIONATED): Heparin Unfractionated: 0.57 IU/mL (ref 0.30–0.70)

## 2022-04-17 LAB — APTT: aPTT: 38 seconds — ABNORMAL HIGH (ref 24–36)

## 2022-04-17 LAB — PLATELET COUNT: Platelets: 223 10*3/uL (ref 150–400)

## 2022-04-17 LAB — MAGNESIUM: Magnesium: 2.8 mg/dL — ABNORMAL HIGH (ref 1.7–2.4)

## 2022-04-17 LAB — PROTIME-INR
INR: 1.2 (ref 0.8–1.2)
Prothrombin Time: 15.1 seconds (ref 11.4–15.2)

## 2022-04-17 SURGERY — CORONARY ARTERY BYPASS GRAFTING (CABG)
Anesthesia: General | Site: Chest

## 2022-04-17 MED ORDER — BISACODYL 5 MG PO TBEC
10.0000 mg | DELAYED_RELEASE_TABLET | Freq: Every day | ORAL | Status: DC
  Administered 2022-04-18 – 2022-04-20 (×3): 10 mg via ORAL
  Filled 2022-04-17 (×3): qty 2

## 2022-04-17 MED ORDER — ALBUMIN HUMAN 5 % IV SOLN: INTRAVENOUS | Status: DC | PRN

## 2022-04-17 MED ORDER — POTASSIUM CHLORIDE 10 MEQ/50ML IV SOLN: 10.0000 meq | INTRAVENOUS | Status: AC

## 2022-04-17 MED ORDER — EPHEDRINE SULFATE-NACL 50-0.9 MG/10ML-% IV SOSY
PREFILLED_SYRINGE | INTRAVENOUS | Status: DC | PRN
  Administered 2022-04-17 (×2): 10 mg via INTRAVENOUS

## 2022-04-17 MED ORDER — CEFAZOLIN SODIUM-DEXTROSE 2-4 GM/100ML-% IV SOLN
2.0000 g | Freq: Three times a day (TID) | INTRAVENOUS | Status: AC
  Administered 2022-04-17 – 2022-04-19 (×6): 2 g via INTRAVENOUS
  Filled 2022-04-17 (×6): qty 100

## 2022-04-17 MED ORDER — NOREPINEPHRINE 4 MG/250ML-% IV SOLN: 0.0000 ug/min | INTRAVENOUS | Status: DC

## 2022-04-17 MED ORDER — FENTANYL CITRATE (PF) 250 MCG/5ML IJ SOLN
INTRAMUSCULAR | Status: AC
  Filled 2022-04-17: qty 5

## 2022-04-17 MED ORDER — DEXTROSE 50 % IV SOLN: 0.0000 mL | INTRAVENOUS | Status: DC | PRN

## 2022-04-17 MED ORDER — ALBUMIN HUMAN 5 % IV SOLN
250.0000 mL | INTRAVENOUS | Status: AC | PRN
  Administered 2022-04-17 (×4): 12.5 g via INTRAVENOUS
  Filled 2022-04-17 (×2): qty 250

## 2022-04-17 MED ORDER — PANTOPRAZOLE SODIUM 40 MG PO TBEC
40.0000 mg | DELAYED_RELEASE_TABLET | Freq: Every day | ORAL | Status: DC
  Administered 2022-04-19 – 2022-04-21 (×3): 40 mg via ORAL
  Filled 2022-04-17 (×3): qty 1

## 2022-04-17 MED ORDER — OXYCODONE HCL 5 MG PO TABS
5.0000 mg | ORAL_TABLET | ORAL | Status: DC | PRN
  Administered 2022-04-18: 10 mg via ORAL
  Filled 2022-04-17: qty 2

## 2022-04-17 MED ORDER — PROTAMINE SULFATE 10 MG/ML IV SOLN
INTRAVENOUS | Status: AC
  Filled 2022-04-17: qty 25

## 2022-04-17 MED ORDER — SODIUM CHLORIDE 0.9 % IV SOLN: 250.0000 mL | INTRAVENOUS | Status: DC

## 2022-04-17 MED ORDER — MIDAZOLAM HCL (PF) 10 MG/2ML IJ SOLN
INTRAMUSCULAR | Status: AC
  Filled 2022-04-17: qty 2

## 2022-04-17 MED ORDER — NICARDIPINE HCL IN NACL 20-0.86 MG/200ML-% IV SOLN
INTRAVENOUS | Status: DC | PRN
  Administered 2022-04-17: 2.5 mg/h via INTRAVENOUS

## 2022-04-17 MED ORDER — CHLORHEXIDINE GLUCONATE CLOTH 2 % EX PADS
6.0000 | MEDICATED_PAD | Freq: Every day | CUTANEOUS | Status: DC
  Administered 2022-04-17 – 2022-04-21 (×5): 6 via TOPICAL

## 2022-04-17 MED ORDER — SODIUM CHLORIDE 0.9 % IV SOLN: INTRAVENOUS | Status: DC | PRN

## 2022-04-17 MED ORDER — PHENYLEPHRINE 80 MCG/ML (10ML) SYRINGE FOR IV PUSH (FOR BLOOD PRESSURE SUPPORT)
PREFILLED_SYRINGE | INTRAVENOUS | Status: DC | PRN
  Administered 2022-04-17: 160 ug via INTRAVENOUS
  Administered 2022-04-17: 40 ug via INTRAVENOUS
  Administered 2022-04-17: 160 ug via INTRAVENOUS
  Administered 2022-04-17 (×2): 40 ug via INTRAVENOUS

## 2022-04-17 MED ORDER — ACETAMINOPHEN 650 MG RE SUPP
650.0000 mg | Freq: Once | RECTAL | Status: AC
  Administered 2022-04-17: 650 mg via RECTAL
  Filled 2022-04-17: qty 1

## 2022-04-17 MED ORDER — FAMOTIDINE IN NACL 20-0.9 MG/50ML-% IV SOLN
20.0000 mg | Freq: Two times a day (BID) | INTRAVENOUS | Status: AC
  Administered 2022-04-17 (×2): 20 mg via INTRAVENOUS
  Filled 2022-04-17 (×2): qty 50

## 2022-04-17 MED ORDER — METOPROLOL TARTRATE 12.5 MG HALF TABLET
12.5000 mg | ORAL_TABLET | Freq: Two times a day (BID) | ORAL | Status: DC
  Administered 2022-04-18 – 2022-04-21 (×7): 12.5 mg via ORAL
  Filled 2022-04-17 (×7): qty 1

## 2022-04-17 MED ORDER — NICARDIPINE HCL IN NACL 20-0.86 MG/200ML-% IV SOLN: 3.0000 mg/h | INTRAVENOUS | Status: DC

## 2022-04-17 MED ORDER — LACTATED RINGERS IV SOLN: INTRAVENOUS | Status: DC

## 2022-04-17 MED ORDER — PHENYLEPHRINE 80 MCG/ML (10ML) SYRINGE FOR IV PUSH (FOR BLOOD PRESSURE SUPPORT)
PREFILLED_SYRINGE | INTRAVENOUS | Status: AC
  Filled 2022-04-17: qty 10

## 2022-04-17 MED ORDER — ACETAMINOPHEN 160 MG/5ML PO SOLN: 650.0000 mg | Freq: Once | ORAL | Status: AC

## 2022-04-17 MED ORDER — ONDANSETRON HCL 4 MG/2ML IJ SOLN: 4.0000 mg | Freq: Four times a day (QID) | INTRAMUSCULAR | Status: DC | PRN

## 2022-04-17 MED ORDER — ACETAMINOPHEN 160 MG/5ML PO SOLN: 1000.0000 mg | Freq: Four times a day (QID) | ORAL | Status: DC

## 2022-04-17 MED ORDER — SUCCINYLCHOLINE CHLORIDE 200 MG/10ML IV SOSY
PREFILLED_SYRINGE | INTRAVENOUS | Status: DC | PRN
  Administered 2022-04-17: 120 mg via INTRAVENOUS

## 2022-04-17 MED ORDER — HEPARIN SODIUM (PORCINE) 1000 UNIT/ML IJ SOLN
INTRAMUSCULAR | Status: DC | PRN
  Administered 2022-04-17: 10000 [IU] via INTRAVENOUS
  Administered 2022-04-17: 8000 [IU] via INTRAVENOUS
  Administered 2022-04-17: 31000 [IU] via INTRAVENOUS
  Administered 2022-04-17: 10000 [IU] via INTRAVENOUS

## 2022-04-17 MED ORDER — PROPOFOL 10 MG/ML IV BOLUS
INTRAVENOUS | Status: DC | PRN
  Administered 2022-04-17: 120 mg via INTRAVENOUS

## 2022-04-17 MED ORDER — CHLORHEXIDINE GLUCONATE 0.12 % MT SOLN
15.0000 mL | OROMUCOSAL | Status: AC
  Administered 2022-04-17: 15 mL via OROMUCOSAL
  Filled 2022-04-17: qty 15

## 2022-04-17 MED ORDER — METOPROLOL TARTRATE 5 MG/5ML IV SOLN: 2.5000 mg | INTRAVENOUS | Status: DC | PRN

## 2022-04-17 MED ORDER — ROCURONIUM BROMIDE 10 MG/ML (PF) SYRINGE
PREFILLED_SYRINGE | INTRAVENOUS | Status: AC
  Filled 2022-04-17: qty 10

## 2022-04-17 MED ORDER — SODIUM BICARBONATE 8.4 % IV SOLN
100.0000 meq | Freq: Once | INTRAVENOUS | Status: AC
  Administered 2022-04-17: 100 meq via INTRAVENOUS

## 2022-04-17 MED ORDER — PLASMA-LYTE A IV SOLN: INTRAVENOUS | Status: DC | PRN

## 2022-04-17 MED ORDER — SODIUM CHLORIDE (PF) 0.9 % IJ SOLN
OROMUCOSAL | Status: DC | PRN
  Administered 2022-04-17 (×2): 4 mL via TOPICAL

## 2022-04-17 MED ORDER — PHENYLEPHRINE HCL-NACL 20-0.9 MG/250ML-% IV SOLN
0.0000 ug/min | INTRAVENOUS | Status: DC
  Administered 2022-04-17: 70 ug/min via INTRAVENOUS
  Filled 2022-04-17: qty 250

## 2022-04-17 MED ORDER — METOPROLOL TARTRATE 25 MG/10 ML ORAL SUSPENSION
12.5000 mg | Freq: Two times a day (BID) | ORAL | Status: DC
  Filled 2022-04-17 (×5): qty 5
  Filled 2022-04-17: qty 10

## 2022-04-17 MED ORDER — MIDAZOLAM HCL (PF) 5 MG/ML IJ SOLN
INTRAMUSCULAR | Status: DC | PRN
  Administered 2022-04-17: 2 mg via INTRAVENOUS
  Administered 2022-04-17: 3 mg via INTRAVENOUS
  Administered 2022-04-17 (×2): 2 mg via INTRAVENOUS
  Administered 2022-04-17: 1 mg via INTRAVENOUS

## 2022-04-17 MED ORDER — DOCUSATE SODIUM 100 MG PO CAPS
200.0000 mg | ORAL_CAPSULE | Freq: Every day | ORAL | Status: DC
  Administered 2022-04-18 – 2022-04-20 (×3): 200 mg via ORAL
  Filled 2022-04-17 (×4): qty 2

## 2022-04-17 MED ORDER — ORAL CARE MOUTH RINSE: 15.0000 mL | OROMUCOSAL | Status: DC

## 2022-04-17 MED ORDER — 0.9 % SODIUM CHLORIDE (POUR BTL) OPTIME
TOPICAL | Status: DC | PRN
  Administered 2022-04-17: 4000 mL

## 2022-04-17 MED ORDER — SODIUM CHLORIDE 0.9 % IV SOLN: INTRAVENOUS | Status: DC

## 2022-04-17 MED ORDER — INSULIN REGULAR(HUMAN) IN NACL 100-0.9 UT/100ML-% IV SOLN
INTRAVENOUS | Status: DC
  Administered 2022-04-18: 6 [IU]/h via INTRAVENOUS
  Filled 2022-04-17: qty 100

## 2022-04-17 MED ORDER — ANTITHROMBIN III (HUMAN) 500 UNITS IV SOLR
INTRAVENOUS | Status: DC | PRN
  Administered 2022-04-17: 500 [IU] via INTRAVENOUS

## 2022-04-17 MED ORDER — LIDOCAINE 2% (20 MG/ML) 5 ML SYRINGE
INTRAMUSCULAR | Status: AC
  Filled 2022-04-17: qty 5

## 2022-04-17 MED ORDER — PROTAMINE SULFATE 10 MG/ML IV SOLN
INTRAVENOUS | Status: AC
  Filled 2022-04-17: qty 5

## 2022-04-17 MED ORDER — LIDOCAINE 2% (20 MG/ML) 5 ML SYRINGE
INTRAMUSCULAR | Status: DC | PRN
  Administered 2022-04-17: 100 mg via INTRAVENOUS

## 2022-04-17 MED ORDER — ORAL CARE MOUTH RINSE: 15.0000 mL | OROMUCOSAL | Status: DC | PRN

## 2022-04-17 MED ORDER — NICARDIPINE HCL IN NACL 40-0.83 MG/200ML-% IV SOLN
3.0000 mg/h | INTRAVENOUS | Status: DC
  Administered 2022-04-17: 2.5 mg/h via INTRAVENOUS
  Filled 2022-04-17: qty 200

## 2022-04-17 MED ORDER — SODIUM CHLORIDE 0.45 % IV SOLN: INTRAVENOUS | Status: DC | PRN

## 2022-04-17 MED ORDER — PROPOFOL 10 MG/ML IV BOLUS
INTRAVENOUS | Status: AC
  Filled 2022-04-17: qty 20

## 2022-04-17 MED ORDER — ASPIRIN 81 MG PO CHEW: 324.0000 mg | CHEWABLE_TABLET | Freq: Every day | ORAL | Status: DC

## 2022-04-17 MED ORDER — MORPHINE SULFATE (PF) 2 MG/ML IV SOLN
1.0000 mg | INTRAVENOUS | Status: DC | PRN
  Administered 2022-04-17: 2 mg via INTRAVENOUS
  Administered 2022-04-17: 4 mg via INTRAVENOUS
  Administered 2022-04-17: 2 mg via INTRAVENOUS
  Administered 2022-04-17: 4 mg via INTRAVENOUS
  Filled 2022-04-17: qty 2
  Filled 2022-04-17: qty 1
  Filled 2022-04-17: qty 2
  Filled 2022-04-17: qty 1

## 2022-04-17 MED ORDER — PROTAMINE SULFATE 10 MG/ML IV SOLN
INTRAVENOUS | Status: DC | PRN
  Administered 2022-04-17: 290 mg via INTRAVENOUS
  Administered 2022-04-17: 20 mg via INTRAVENOUS

## 2022-04-17 MED ORDER — HEPARIN SODIUM (PORCINE) 1000 UNIT/ML IJ SOLN
INTRAMUSCULAR | Status: AC
  Filled 2022-04-17: qty 30

## 2022-04-17 MED ORDER — FENTANYL CITRATE (PF) 250 MCG/5ML IJ SOLN
INTRAMUSCULAR | Status: DC | PRN
  Administered 2022-04-17: 250 ug via INTRAVENOUS
  Administered 2022-04-17: 100 ug via INTRAVENOUS
  Administered 2022-04-17: 250 ug via INTRAVENOUS
  Administered 2022-04-17: 400 ug via INTRAVENOUS

## 2022-04-17 MED ORDER — VANCOMYCIN HCL IN DEXTROSE 1-5 GM/200ML-% IV SOLN
1000.0000 mg | Freq: Once | INTRAVENOUS | Status: AC
  Administered 2022-04-17: 1000 mg via INTRAVENOUS
  Filled 2022-04-17: qty 200

## 2022-04-17 MED ORDER — BISACODYL 10 MG RE SUPP
10.0000 mg | Freq: Every day | RECTAL | Status: DC
  Filled 2022-04-17: qty 1

## 2022-04-17 MED ORDER — MIDAZOLAM HCL 2 MG/2ML IJ SOLN: 2.0000 mg | INTRAMUSCULAR | Status: DC | PRN

## 2022-04-17 MED ORDER — HEPARIN SODIUM (PORCINE) 1000 UNIT/ML IJ SOLN
INTRAMUSCULAR | Status: AC
  Filled 2022-04-17: qty 1

## 2022-04-17 MED ORDER — TRAMADOL HCL 50 MG PO TABS
50.0000 mg | ORAL_TABLET | ORAL | Status: DC | PRN
  Administered 2022-04-19: 100 mg via ORAL
  Filled 2022-04-17: qty 2

## 2022-04-17 MED ORDER — ROCURONIUM BROMIDE 10 MG/ML (PF) SYRINGE
PREFILLED_SYRINGE | INTRAVENOUS | Status: DC | PRN
  Administered 2022-04-17 (×2): 50 mg via INTRAVENOUS
  Administered 2022-04-17: 100 mg via INTRAVENOUS

## 2022-04-17 MED ORDER — ASPIRIN 325 MG PO TBEC
325.0000 mg | DELAYED_RELEASE_TABLET | Freq: Every day | ORAL | Status: DC
  Administered 2022-04-18: 325 mg via ORAL
  Filled 2022-04-17: qty 1

## 2022-04-17 MED ORDER — ACETAMINOPHEN 500 MG PO TABS
1000.0000 mg | ORAL_TABLET | Freq: Four times a day (QID) | ORAL | Status: DC
  Administered 2022-04-17 – 2022-04-21 (×14): 1000 mg via ORAL
  Filled 2022-04-17 (×13): qty 2

## 2022-04-17 MED ORDER — SUCCINYLCHOLINE CHLORIDE 200 MG/10ML IV SOSY
PREFILLED_SYRINGE | INTRAVENOUS | Status: AC
  Filled 2022-04-17: qty 10

## 2022-04-17 MED ORDER — MAGNESIUM SULFATE 4 GM/100ML IV SOLN
4.0000 g | Freq: Once | INTRAVENOUS | Status: AC
  Administered 2022-04-17: 4 g via INTRAVENOUS
  Filled 2022-04-17: qty 100

## 2022-04-17 MED ORDER — DOBUTAMINE IN D5W 4-5 MG/ML-% IV SOLN: 0.0000 ug/kg/min | INTRAVENOUS | Status: DC

## 2022-04-17 MED ORDER — LACTATED RINGERS IV SOLN: INTRAVENOUS | Status: DC | PRN

## 2022-04-17 MED ORDER — SODIUM BICARBONATE 8.4 % IV SOLN
50.0000 meq | Freq: Once | INTRAVENOUS | Status: AC
  Administered 2022-04-17: 50 meq via INTRAVENOUS
  Filled 2022-04-17: qty 50

## 2022-04-17 MED ORDER — LACTATED RINGERS IV SOLN: 500.0000 mL | Freq: Once | INTRAVENOUS | Status: DC | PRN

## 2022-04-17 MED ORDER — SODIUM CHLORIDE 0.9% FLUSH: 3.0000 mL | INTRAVENOUS | Status: DC | PRN

## 2022-04-17 MED ORDER — DEXMEDETOMIDINE HCL IN NACL 400 MCG/100ML IV SOLN: 0.0000 ug/kg/h | INTRAVENOUS | Status: DC

## 2022-04-17 MED ORDER — SODIUM CHLORIDE 0.9% FLUSH
3.0000 mL | Freq: Two times a day (BID) | INTRAVENOUS | Status: DC
  Administered 2022-04-18 (×2): 3 mL via INTRAVENOUS

## 2022-04-17 MED ORDER — EPHEDRINE 5 MG/ML INJ
INTRAVENOUS | Status: AC
  Filled 2022-04-17: qty 5

## 2022-04-17 SURGICAL SUPPLY — 97 items
APPLIER CLIP 9.375 SM OPEN (CLIP) ×3
BAG DECANTER FOR FLEXI CONT (MISCELLANEOUS) ×3 IMPLANT
BLADE CLIPPER SURG (BLADE) ×6 IMPLANT
BLADE STERNUM SYSTEM 6 (BLADE) ×3 IMPLANT
BLADE SURG 15 STRL LF DISP TIS (BLADE) ×3 IMPLANT
BLADE SURG 15 STRL SS (BLADE) ×6
BNDG ELASTIC 4X5.8 VLCR STR LF (GAUZE/BANDAGES/DRESSINGS) ×6 IMPLANT
BNDG ELASTIC 6X5.8 VLCR STR LF (GAUZE/BANDAGES/DRESSINGS) ×3 IMPLANT
BNDG GAUZE DERMACEA FLUFF 4 (GAUZE/BANDAGES/DRESSINGS) ×3 IMPLANT
CABLE SURGICAL S-101-97-12 (CABLE) ×3 IMPLANT
CANISTER SUCT 3000ML PPV (MISCELLANEOUS) ×3 IMPLANT
CANNULA MC2 2 STG 29/37 NON-V (CANNULA) ×3 IMPLANT
CANNULA MC2 TWO STAGE (CANNULA) ×3
CANNULA NON VENT 20FR 12 (CANNULA) ×3 IMPLANT
CATH ROBINSON RED A/P 18FR (CATHETERS) ×6 IMPLANT
CLIP APPLIE 9.375 SM OPEN (CLIP) ×3 IMPLANT
CLIP VESOCCLUDE MED 24/CT (CLIP) IMPLANT
CLIP VESOCCLUDE SM WIDE 24/CT (CLIP) IMPLANT
CONN ST 1/2X1/2  BEN (MISCELLANEOUS) ×3
CONN ST 1/2X1/2 BEN (MISCELLANEOUS) ×3 IMPLANT
CONNECTOR BLAKE 2:1 CARIO BLK (MISCELLANEOUS) ×3 IMPLANT
CONTAINER PROTECT SURGISLUSH (MISCELLANEOUS) ×6 IMPLANT
COVER MAYO STAND STRL (DRAPES) ×3 IMPLANT
DRAIN CHANNEL 19F RND (DRAIN) ×9 IMPLANT
DRAIN CONNECTOR BLAKE 1:1 (MISCELLANEOUS) ×3 IMPLANT
DRAPE CARDIOVASCULAR INCISE (DRAPES) ×3
DRAPE EXTREMITY T 121X128X90 (DISPOSABLE) ×3 IMPLANT
DRAPE HALF SHEET 40X57 (DRAPES) ×3 IMPLANT
DRAPE INCISE IOBAN 66X45 STRL (DRAPES) IMPLANT
DRAPE SRG 135X102X78XABS (DRAPES) ×3 IMPLANT
DRAPE WARM FLUID 44X44 (DRAPES) ×3 IMPLANT
DRSG AQUACEL AG ADV 3.5X10 (GAUZE/BANDAGES/DRESSINGS) ×3 IMPLANT
ELECT BLADE 4.0 EZ CLEAN MEGAD (MISCELLANEOUS) ×3
ELECT REM PT RETURN 9FT ADLT (ELECTROSURGICAL) ×6
ELECT SOLID GEL RDN PRO-PADZ (MISCELLANEOUS) ×3
ELECTRODE BLDE 4.0 EZ CLN MEGD (MISCELLANEOUS) ×3 IMPLANT
ELECTRODE REM PT RTRN 9FT ADLT (ELECTROSURGICAL) IMPLANT
ELECTRODE SOLI GEL RDN PROPADZ (MISCELLANEOUS) IMPLANT
FELT TEFLON 1X6 (MISCELLANEOUS) ×6 IMPLANT
GAUZE SPONGE 4X4 12PLY STRL (GAUZE/BANDAGES/DRESSINGS) ×6 IMPLANT
GAUZE SPONGE 4X4 12PLY STRL LF (GAUZE/BANDAGES/DRESSINGS) IMPLANT
GLOVE BIO SURGEON STRL SZ 6.5 (GLOVE) IMPLANT
GLOVE BIO SURGEON STRL SZ7 (GLOVE) ×6 IMPLANT
GLOVE BIOGEL M STRL SZ7.5 (GLOVE) ×6 IMPLANT
GLOVE SURG SS PI 6.5 STRL IVOR (GLOVE) IMPLANT
GLOVE SURG SS PI 7.0 STRL IVOR (GLOVE) IMPLANT
GOWN STRL REUS W/ TWL LRG LVL3 (GOWN DISPOSABLE) ×12 IMPLANT
GOWN STRL REUS W/ TWL XL LVL3 (GOWN DISPOSABLE) ×6 IMPLANT
GOWN STRL REUS W/TWL LRG LVL3 (GOWN DISPOSABLE) ×21
GOWN STRL REUS W/TWL XL LVL3 (GOWN DISPOSABLE) ×9
HEMOSTAT POWDER SURGIFOAM 1G (HEMOSTASIS) ×9 IMPLANT
INSERT SUTURE HOLDER (MISCELLANEOUS) ×3 IMPLANT
KIT BASIN OR (CUSTOM PROCEDURE TRAY) ×3 IMPLANT
KIT SUCTION CATH 14FR (SUCTIONS) ×3 IMPLANT
KIT TURNOVER KIT B (KITS) ×6 IMPLANT
KIT VASOVIEW HEMOPRO 2 VH 4000 (KITS) ×3 IMPLANT
LEAD PACING MYOCARDI (MISCELLANEOUS) ×3 IMPLANT
MARKER GRAFT CORONARY BYPASS (MISCELLANEOUS) ×9 IMPLANT
MARKER PEN SURG W/LABELS BLK (STERILIZATION PRODUCTS) IMPLANT
NS IRRIG 1000ML POUR BTL (IV SOLUTION) ×15 IMPLANT
PACK ACCESSORY CANNULA KIT (KITS) ×3 IMPLANT
PACK E OPEN HEART (SUTURE) ×3 IMPLANT
PACK OPEN HEART (CUSTOM PROCEDURE TRAY) ×3 IMPLANT
PAD ARMBOARD 7.5X6 YLW CONV (MISCELLANEOUS) ×12 IMPLANT
PAD ELECT DEFIB RADIOL ZOLL (MISCELLANEOUS) ×3 IMPLANT
PENCIL BUTTON HOLSTER BLD 10FT (ELECTRODE) ×3 IMPLANT
POSITIONER HEAD DONUT 9IN (MISCELLANEOUS) ×3 IMPLANT
PUNCH AORTIC ROTATE 4.0MM (MISCELLANEOUS) ×3 IMPLANT
SET MPS 3-ND DEL (MISCELLANEOUS) IMPLANT
SHEARS HARMONIC 9CM CVD (BLADE) ×3 IMPLANT
SHEARS HARMONIC FOCUS 9 REPROC (ELECTROSURGICAL) IMPLANT
SPONGE T-LAP 18X18 ~~LOC~~+RFID (SPONGE) ×24 IMPLANT
SUPPORT HEART JANKE-BARRON (MISCELLANEOUS) ×3 IMPLANT
SUT BONE WAX W31G (SUTURE) ×3 IMPLANT
SUT ETHIBOND X763 2 0 SH 1 (SUTURE) ×6 IMPLANT
SUT MNCRL AB 3-0 PS2 18 (SUTURE) ×6 IMPLANT
SUT PDS AB 1 CTX 36 (SUTURE) ×6 IMPLANT
SUT PROLENE 4 0 RB 1 (SUTURE) ×6
SUT PROLENE 4 0 SH DA (SUTURE) ×3 IMPLANT
SUT PROLENE 4-0 RB1 .5 CRCL 36 (SUTURE) IMPLANT
SUT PROLENE 5 0 C 1 36 (SUTURE) ×9 IMPLANT
SUT PROLENE 7 0 BV 1 (SUTURE) IMPLANT
SUT PROLENE 7 0 BV1 MDA (SUTURE) ×3 IMPLANT
SUT STEEL 6MS V (SUTURE) ×6 IMPLANT
SUT VIC AB 2-0 CT1 27 (SUTURE) ×6
SUT VIC AB 2-0 CT1 TAPERPNT 27 (SUTURE) IMPLANT
SUT VIC AB 3-0 SH 27 (SUTURE)
SUT VIC AB 3-0 SH 27X BRD (SUTURE) IMPLANT
SUT VIC AB 3-0 X1 27 (SUTURE) IMPLANT
SYSTEM SAHARA CHEST DRAIN ATS (WOUND CARE) ×3 IMPLANT
TAPE CLOTH SURG 4X10 WHT LF (GAUZE/BANDAGES/DRESSINGS) IMPLANT
TAPE PAPER 2X10 WHT MICROPORE (GAUZE/BANDAGES/DRESSINGS) IMPLANT
TOWEL GREEN STERILE (TOWEL DISPOSABLE) ×6 IMPLANT
TOWEL GREEN STERILE FF (TOWEL DISPOSABLE) ×6 IMPLANT
TRAY FOLEY SLVR 16FR TEMP STAT (SET/KITS/TRAYS/PACK) ×3 IMPLANT
UNDERPAD 30X36 HEAVY ABSORB (UNDERPADS AND DIAPERS) ×6 IMPLANT
WATER STERILE IRR 1000ML POUR (IV SOLUTION) ×6 IMPLANT

## 2022-04-17 NOTE — Progress Notes (Signed)
Echocardiogram Echocardiogram Transesophageal has been performed.  Fidel Levy 04/17/2022, 8:19 AM

## 2022-04-17 NOTE — Transfer of Care (Signed)
Immediate Anesthesia Transfer of Care Note  Patient: Dustin Wood  Procedure(s) Performed: CORONARY ARTERY BYPASS GRAFTING (CABG) X TWO, USING LEFT INTERNAL MAMMARY ARTERY AND LEFT ARM RADIAL ARTERY (Chest) LEFT RADIAL ARTERY HARVEST (Arm Lower) TRANSESOPHAGEAL ECHOCARDIOGRAM (TEE)  Patient Location: ICU  Anesthesia Type:General  Level of Consciousness: sedated and Patient remains intubated per anesthesia plan  Airway & Oxygen Therapy: Patient remains intubated per anesthesia plan and Patient placed on Ventilator (see vital sign flow sheet for setting)  Post-op Assessment: Report given to RN and Post -op Vital signs reviewed and stable  Post vital signs: Reviewed and stable  Last Vitals:  Vitals Value Taken Time  BP 113/66 04/17/22 1246  Temp    Pulse 65 04/17/22 1246  Resp 27 04/17/22 1246  SpO2 90 % 04/17/22 1246  Vitals shown include unvalidated device data.  Last Pain:  Vitals:   04/17/22 0422  TempSrc: Oral  PainSc:       Patients Stated Pain Goal: 0 (32/12/24 8250)  Complications: No notable events documented.

## 2022-04-17 NOTE — Progress Notes (Signed)
Pt achieved a NIF of -20 and VC of 1.02 will good pt effort on all attempts. RN at bedside, RT will monitor.

## 2022-04-17 NOTE — Anesthesia Postprocedure Evaluation (Signed)
Anesthesia Post Note  Patient: Dustin Wood  Procedure(s) Performed: CORONARY ARTERY BYPASS GRAFTING (CABG) X TWO, USING LEFT INTERNAL MAMMARY ARTERY AND LEFT ARM RADIAL ARTERY (Chest) LEFT RADIAL ARTERY HARVEST (Arm Lower) TRANSESOPHAGEAL ECHOCARDIOGRAM (TEE)     Patient location during evaluation: SICU Anesthesia Type: General Level of consciousness: patient remains intubated per anesthesia plan Pain management: pain level controlled Vital Signs Assessment: post-procedure vital signs reviewed and stable Respiratory status: patient remains intubated per anesthesia plan Cardiovascular status: stable Postop Assessment: no apparent nausea or vomiting Anesthetic complications: no   No notable events documented.  Last Vitals:  Vitals:   04/17/22 1645 04/17/22 1702  BP:    Pulse: 86   Resp: (!) 24   Temp: (!) 36.3 C   SpO2: 91% 94%    Last Pain:  Vitals:   04/17/22 1422  TempSrc: Axillary  PainSc:                  Fred Hammes

## 2022-04-17 NOTE — Progress Notes (Signed)
ANTICOAGULATION CONSULT NOTE- follow-up  Pharmacy Consult for Heparin Indication: chest pain/ACS, s/p cath, CABG 9/28  Allergies  Allergen Reactions   Penicillins Other (See Comments)    Childhood allergy    Patient Measurements: Height: '5\' 9"'$  (175.3 cm) Weight: 89.2 kg (196 lb 9.6 oz) IBW/kg (Calculated) : 70.7 Heparin Dosing Weight: 89.1 kg   Vital Signs: Temp: 98.1 F (36.7 C) (09/27 2016) Temp Source: Oral (09/27 2016) BP: 137/78 (09/27 2016) Pulse Rate: 72 (09/27 2016)  Labs: Recent Labs    04/14/22 0220 04/14/22 0703 04/15/22 0234 04/15/22 1211 04/16/22 0307 04/16/22 0837 04/16/22 1611 04/16/22 2257  HGB 14.5  --  13.4  --  14.0  --   --   --   HCT 42.6  --  39.0  --  41.5  --   --   --   PLT 257  --  229  --  220  --   --   --   APTT  --   --   --   --   --   --   --  93*  HEPARINUNFRC  --    < > 0.15*   < > 0.59 0.77* 0.59 0.57  CREATININE 1.22  --  1.03  --   --   --   --   --   TROPONINIHS 612*  --   --   --   --   --   --   --    < > = values in this interval not displayed.     Estimated Creatinine Clearance: 80 mL/min (by C-G formula based on SCr of 1.03 mg/dL).   Medical History: Past Medical History:  Diagnosis Date   Melanoma ([place]) 04/07/2019   right fore arm MIS TX EXC   SCC (squamous cell carcinoma) 06/30/2016   RIGHT DORSAL HAND CX3 5FU   SCC (squamous cell carcinoma) 06/30/2016   LEFT HAND TX CX3 5FU   Squamous cell carcinoma of skin 12/2015   RIGHT TEMPLE CX3 5FU    Medications:  Medications Prior to Admission  Medication Sig Dispense Refill Last Dose   ascorbic acid (VITAMIN C) 500 MG tablet Take 500 mg by mouth daily.   04/13/2022   Cholecalciferol (VITAMIN D3) 50 MCG (2000 UT) TABS Take 1 tablet by mouth daily.   04/13/2022   fluorouracil (EFUDEX) 5 % cream APPLY TO AFFECTED AREA(S) AT BEDTIME AS DIRECTED (Patient taking differently: Apply 1 Application topically daily as needed (for rash).) 40 g 1 unknown   Multiple Vitamin  (MULITIVITAMIN WITH MINERALS) TABS Take 1 tablet by mouth daily.   04/12/2022   omega-3 acid ethyl esters (LOVAZA) 1 g capsule Take 1 g by mouth 2 (two) times daily.   04/13/2022   saw palmetto 500 MG capsule Take 500 mg by mouth in the morning, at noon, in the evening, and at bedtime.   04/13/2022   Turmeric (QC TUMERIC COMPLEX) 500 MG CAPS Take 1 capsule by mouth daily.   04/13/2022   zinc gluconate 50 MG tablet Take 50 mg by mouth daily.   04/13/2022   terbinafine (LAMISIL) 250 MG tablet Take 1 tablet (250 mg total) by mouth daily. (Patient not taking: Reported on 01/28/2021) 30 tablet 3 Not Taking     Assessment: 59 yom presenting with chest pain. Heparin per pharmacy consult placed for chest pain/ACS. Patient is not on anticoagulation prior to arrival. Patient has returned to unit s/p planned cardiac cath. Order received to restart the  heparin infusion without a bolus 2 hours after the sheath has been removed. Removal was noted at 16:18 on 9/25.   Most recent heparin level therapeutic at 0.59. CBC stable. No line issues or signs/symptoms of bleeding reported.  9/28 AM update:  Heparin level therapeutic x 2 (0.59, 0.57)  Goal of Therapy:  Heparin level 0.3-0.7 units/ml Monitor platelets by anticoagulation protocol: Yes   Plan:  Cont heparin 1900 units/hr CABG 9/28   Narda Bonds, PharmD, [place] Clinical Pharmacist Phone: [phone]

## 2022-04-17 NOTE — Procedures (Signed)
Extubation Procedure Note  Patient Details:   Name: ALISHA BURGO DOB: 03/29/1956 MRN: [id number]   Airway Documentation:    Vent end date: 04/17/22 Vent end time: 1703   Evaluation  O2 sats: stable throughout Complications: No apparent complications Patient did tolerate procedure well. Bilateral Breath Sounds: Clear, Diminished   Yes  Pt extubated to 6L HFNC, Pt tolerating well at this time. Cuff leak present, no stridor noted, RN at bedside, RT will monitor.   Carren Rang 04/17/2022, 5:04 PM

## 2022-04-17 NOTE — Progress Notes (Signed)
CT surgery PM rounds  Patient hemodynamically stable after CABG x2 with left radial artery Patient on low-dose Neo-Synephrine and norepinephrine to maintain adequate blood pressure Patient extubated with mild to moderate surgical pain, breathing adequately with O2 sats 95% on 4 L Minimal chest tube output 350 cc Good grip of left hand  Postextubation ABG was pH 7.2, bicarb 19, hemoglobin 11.2 and creatinine 1.1 Will treat with 1 amp bicarb and repeat ABG  Blood pressure 124/68, pulse 75, temperature 98.4 F (36.9 C), resp. rate (!) 27, height '5\' 9"'$  (1.753 m), weight 87.3 kg, SpO2 92 %.

## 2022-04-17 NOTE — Anesthesia Procedure Notes (Signed)
Arterial Line Insertion Performed by: Valda Favia, CRNA, CRNA  Preanesthetic checklist: patient identified, IV checked, site marked, risks and benefits discussed, surgical consent, monitors and equipment checked, pre-op evaluation, timeout performed and anesthesia consent Lidocaine 1% used for infiltration Right, radial was placed Catheter size: 20 G Hand hygiene performed , maximum sterile barriers used  and Seldinger technique used Allen's test indicative of satisfactory collateral circulation Attempts: 1 Procedure performed without using ultrasound guided technique. Following insertion, dressing applied and Biopatch. Post procedure assessment: normal  Patient tolerated the procedure well with no immediate complications.

## 2022-04-17 NOTE — Hospital Course (Addendum)
    History of Present Illness:    At time of CT surgical consultation [name] is a 66 year old gentleman with past history of psoriasis as well as both melanoma and squamous cell carcinoma.  He has no other significant past medical history or family history of coronary artery disease.  He is a non-smoker.  He developed some chest pain while hiking with his family on 04/13/2022.  This was associated with nausea and diaphoresis.   The pain radiated to the left arm and shoulder.  He presented to [facility].  Initial EKG was unremarkable showing no ischemic changes.  High-sensitivity troponin was elevated at 160 and later rose to peak of 600.  [name] was transferred to the [facility] with a diagnosis of acute non-ST elevation myocardial infarction.  He was treated with aspirin and started on heparin. Repeat EKG after admission did show ischemic changes.  An echocardiogram was performed showing ejection fraction of 50 to 55% with hypokinesis of the inferior wall.  There was discordant motion of the septum.  There were no structural or functional valvular abnormalities.  Dustin Wood remained stable and underwent left heart catheterization yesterday afternoon demonstrating wall LAD stenosis with involvement at the first diagonal the RCA and circumflex had no obstructive disease.   [name] is resting in bed with his wife at the bedside.  He is in no distress and denies having any pain or shortness of breath. 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  He is right-handed.  Hospital course:  Following medical stabilization and full diagnostic evaluation the patient was felt to be acceptable to proceed with surgery and on 04/17/2022 he was taken the operating room at which time he underwent coronary artery bypass grafting x2.  A LIMA-LAD and left radial-diagonal were performed.  The patient tolerated procedure well was taken the surgical  intensive care unit in stable condition.  Postoperative hospital course:  Patient is doing very well.  He maintained stable hemodynamics and was weaned from the ventilator without difficulty using standard protocols.  Renal function has remained normal.  He had a mild expected acute blood loss anemia which is stabilized with equilibration and diuresis.  He did have a postoperative reactive leukocytosis but this has trended lower over time and he evidences no signs of infection.  He was started on Norvasc for radial artery harvest as well as hypertension.  He was found this hospitalization to have type 2 diabetes and subsequently has undergone counseling by the diabetes coordinators and has additionally been started on Glucophage.  Oxygen was weaned and he maintains good saturations on room air.  Incisions are noted to be healing well and left hand is neurovascularly intact.  He was tolerating routine activities using standard post cardiac rehabilitation phase 1 modalities.  He has maintained normal sinus rhythm.  Overall, at the time of discharge the patient is felt to be quite stable.

## 2022-04-17 NOTE — Anesthesia Procedure Notes (Signed)
Central Venous Catheter Insertion Performed by: Belinda Block, MD, anesthesiologist Start/End9/28/2023 7:10 AM, 04/17/2022 7:26 AM Patient location: Pre-op. Preanesthetic checklist: patient identified, IV checked, site marked, risks and benefits discussed, surgical consent, monitors and equipment checked, pre-op evaluation, timeout performed and anesthesia consent Lidocaine 1% used for infiltration and patient sedated Hand hygiene performed  and maximum sterile barriers used  Catheter size: 8.5 Fr Sheath introducer Procedure performed using ultrasound guided technique. Ultrasound Notes:anatomy identified, needle tip was noted to be adjacent to the nerve/plexus identified, no ultrasound evidence of intravascular and/or intraneural injection and image(s) printed for medical record Attempts: 1 Following insertion, line sutured and dressing applied. Post procedure assessment: blood return through all ports, free fluid flow and no air  Patient tolerated the procedure well with no immediate complications.

## 2022-04-17 NOTE — Brief Op Note (Signed)
04/13/2022 - 04/17/2022  12:33 PM  PATIENT:  Dustin Wood  66 y.o. male  PRE-OPERATIVE DIAGNOSIS:  Coronary Artery Disease  POST-OPERATIVE DIAGNOSIS:  Coronary Artery Disease  PROCEDURE:  Procedure(s): CORONARY ARTERY BYPASS GRAFTING (CABG) X TWO, USING LEFT INTERNAL MAMMARY ARTERY AND LEFT ARM RADIAL ARTERY (N/A) LEFT RADIAL ARTERY HARVEST (N/A) TRANSESOPHAGEAL ECHOCARDIOGRAM (TEE) (N/A) LIMA-LAD LEFT RADIAL-DIAG, T GRAFT ODD MAMMARY RADIAL harvest time: 15mn RADIAL prep time: 562m  SURGEON:  Surgeon(s) and Role:    * Lightfoot, HaLucile CraterMD - Primary  PHYSICIAN ASSISTANT: Ceylon Arenson PA-C  ASSISTANTS: RNFA STAFF   ANESTHESIA:   general  EBL:  550 mL   BLOOD ADMINISTERED:none  DRAINS:  LEFT PLEURAL AND MEDIASTINAL CHEST TUBE    LOCAL MEDICATIONS USED:  NONE  SPECIMEN:  No Specimen  DISPOSITION OF SPECIMEN:  N/A  COUNTS:  YES  TOURNIQUET:  * No tourniquets in log *  DICTATION: .Dragon Dictation  PLAN OF CARE: Admit to inpatient   PATIENT DISPOSITION:  ICU - intubated and hemodynamically stable.   Delay start of Pharmacological VTE agent (>24hrs) due to surgical blood loss or risk of bleeding: yes  COMPLICATIONS: NO KNOWN

## 2022-04-17 NOTE — Plan of Care (Signed)
  Problem: Cardiac: Goal: Will achieve and/or maintain hemodynamic stability Outcome: Progressing   Problem: Clinical Measurements: Goal: Postoperative complications will be avoided or minimized Outcome: Progressing   Problem: Respiratory: Goal: Respiratory status will improve Outcome: Progressing

## 2022-04-17 NOTE — Plan of Care (Signed)
  Problem: Education: Goal: Knowledge of General Education information will improve Description: Including pain rating scale, medication(s)/side effects and non-pharmacologic comfort measures Outcome: Progressing   Problem: Health Behavior/Discharge Planning: Goal: Ability to manage health-related needs will improve Outcome: Progressing   Problem: Clinical Measurements: Goal: Respiratory complications will improve Outcome: Progressing   Problem: Clinical Measurements: Goal: Cardiovascular complication will be avoided Outcome: Progressing   Problem: Coping: Goal: Level of anxiety will decrease Outcome: Progressing   Problem: Pain Managment: Goal: General experience of comfort will improve Outcome: Progressing   Problem: Safety: Goal: Ability to remain free from injury will improve Outcome: Progressing   Problem: Cardiovascular: Goal: Ability to achieve and maintain adequate cardiovascular perfusion will improve Outcome: Progressing Goal: Vascular access site(s) Level 0-1 will be maintained Outcome: Progressing

## 2022-04-17 NOTE — Op Note (Signed)
[address]       [place] [address]             [phone]                                          04/17/2022 Patient:  WINNER VALERIANO Pre-Op Dx: NSTEMI Single vessel CAD     Post-op Dx:  same Procedure: CABG X 2.  LITA LAD, Left radial artery to Diagonal (Y graft of LITA)     Surgeon and Role:      * Tomi Paddock, Lucile Crater, MD - [place] , PA-C - assisting An experienced assistant was required given the complexity of this surgery and the standard of surgical care. The assistant was needed for exposure, dissection, suctioning, retraction of delicate tissues and sutures, instrument exchange and for overall help during this procedure.    Anesthesia  general EBL:  59m Blood Administration: none Xclamp Time:  69 min Pump Time:  851m  Drains: 19 F blake drain: L, mediastinal  Wires: ventricular Counts: correct   Indications: [age]yoale with single vessel CAD.  The LAD appears intramyocardial.  We discussed the risks and benefits of surgical revascularization with use of the LIMA and radial artery.  He is agreeable to proceed, and is scheduled for 04/17/22.  Findings: Small LIMA and radial.  Intramyocardial LAD, small.  Small diagonal  Operative Technique: All invasive lines were placed in pre-op holding.  After the risks, benefits and alternatives were thoroughly discussed, the patient was brought to the operative theatre.  Anesthesia was induced, and the patient was prepped and draped in normal sterile fashion.  An appropriate surgical pause was performed, and pre-operative antibiotics were dosed accordingly.  We began with simultaneous incisions along the left arm for harvesting of the radial artery and the chest for the sternotomy.  In regards to the sternotomy, this was carried down with bovie cautery, and the sternum was divided with a reciprocating saw.  Meticulous hemostasis was obtained.  The left internal thoracic artery was exposed  and harvested in in pedicled fashion.  The patient was systemically heparinized, and the artery was divided distally, and placed in a papaverine sponge.    The sternal elevator was removed, and a retractor was placed.  The pericardium was divided in the midline and fashioned into a cradle with pericardial stitches.   After we confirmed an appropriate ACT, the ascending aorta was cannulated in standard fashion.  The right atrial appendage was used for venous cannulation site.  Cardiopulmonary bypass was initiated, and the heart retractor was placed. The cross clamp was applied, and a dose of anterograde cardioplegia was given with good arrest of the heart.  Next, we exposed the anterior wall of the heart and identified a good target on Diagonal branch.   An arteriotomy was created.  The radial artery was anastomosed in an end to side fashion.  We then jumped the radial artery off of the mid portion of the LITA.  Finally, we exposed a good target on the LAD, and fashioned an end to side anastomosis between it and the LITA.  We began to re-warm, and a re-animation dose of cardioplegia was given.  The heart was de-aired, and the cross clamp was removed.  Meticulous hemostasis was obtained.    We separated from cardiopulmonary  bypass without event.  The heparin was reversed with protamine.  Chest tubes and wires were placed, and the sternum was re-approximated with sternal wires.  The soft tissue and skin were re-approximated wth absorbable suture.    The patient tolerated the procedure without any immediate complications, and was transferred to the ICU in guarded condition.  Rosaland Shiffman Bary Leriche

## 2022-04-17 NOTE — Consult Note (Addendum)
NAME:  Dustin Wood, MRN:  [id number], DOB:  June 08, 1956, LOS: 4 ADMISSION DATE:  04/13/2022, CONSULTATION DATE:  9/28 REFERRING MD:  Kipp Brood, CHIEF COMPLAINT:  post-CABG   History of Present Illness:  Dustin Wood is a 66 y/o gentleman with a history of psoriasis, melanoma who presented with CP radiating to his left shoulder and arm with associated nausea and diaphoresis on 9/24. He underwent LHC on 9/25 demonstrating severe LAD and first diagonal CAD, not amenable to PCI.  Echo demonstrated preserved EF but inferior wall hypokinesis. He was referred for CABG. On 9/28 he had 2-v CABG, LIMA>LAD & L radial > diag.  He is a never smoker. He was diagnosed with HLD & DM this admission (A1c 7.5)   Pertinent  Medical History  CAD Melanoma psoriasis  Significant Hospital Events: Including procedures, antibiotic start and stop dates in addition to other pertinent events   9/24 admitted with inferior wall NSTEMI 9/25 cath, 95% stenosis of prox LAD extending into first diag 9/28 2-vessel CABG on pump  Interim History / Subjective:    Objective   Blood pressure 105/62, pulse 76, temperature 97.6 F (36.4 C), temperature source Axillary, resp. rate 14, height '5\' 9"'$  (1.753 m), weight 87.3 kg, SpO2 97 %.    Vent Mode: SIMV;PRVC;PSV FiO2 (%):  [50 %-60 %] 50 % Set Rate:  [12 bmp] 12 bmp PEEP:  [5 cmH20] 5 cmH20 Pressure Support:  [10 cmH20] 10 cmH20 Plateau Pressure:  [20 cmH20] 20 cmH20   Intake/Output Summary (Last 24 hours) at 04/17/2022 1429 Last data filed at 04/17/2022 1400 Gross per 24 hour  Intake 4234.88 ml  Output 2106 ml  Net 2128.88 ml   Filed Weights   04/13/22 1419 04/15/22 0500 04/17/22 0422  Weight: 90.7 kg 89.2 kg 87.3 kg    Examination: General: critically ill appearing man lying in bed intubated, sedated HENT: [place]/AT, eyes anicteric, ETT in place. Lungs: CTAB, synchronous with MV Cardiovascular: S1S2, RRR. Not currently pacing. Mediastinal drains with minimal  output Abdomen: soft, NT Extremities: left arm bandaged, no LE edema.  Neuro: RASS -5, PERRL GU: foley draining clear yellow urine  CXR personally reviewed> smell left effusion, mediastinal and left chest tubes. ETT 5.5cm above carina.   WBC 21 H/H 12.4/36.3  Resolved Hospital Problem list     Assessment & Plan:  CAD with inferior wall NSTEMI, s/p CABG x 2. Risk factors DM & HLD. -post-op care per TCTS -complete post-op antibiotics -wean post-op pressors as able -pain control- morphine, oxycodone, tramadol -aspirin, statin -can start metoprolol once off pressors -plavix once tubes and wires out -Cardene for radial artery prophylaxis   Post-op vent management -rapid wean protocol -pulmonary hygiene -wean FiO2 as able  DM, hyperglycemia. A1c 7.5 -insulin gtt per protocol  Reactive leukocytosis Mild acute anemia, expected post-op -monitor  Best Practice (right click and "Reselect all SmartList Selections" daily)   Diet/type: NPO DVT prophylaxis: SCD GI prophylaxis: H2B and PPI Lines: Central line and Arterial Line Foley:  Yes, and it is still needed Code Status:  full code Last date of multidisciplinary goals of care discussion '[ ]'$   Labs   CBC: Recent Labs  Lab 04/13/22 1456 04/14/22 0220 04/15/22 0234 04/16/22 0307 04/17/22 0227 04/17/22 0815 04/17/22 1101 04/17/22 1120 04/17/22 1126 04/17/22 1151 04/17/22 1159  WBC 9.6 8.7 7.6 9.2 9.2  --   --   --   --   --   --   NEUTROABS  --  4.9  --   --   --   --   --   --   --   --   --  HGB 14.9 14.5 13.4 14.0 14.2   < > 8.8* 9.2* 9.7* 9.2* 10.5*  HCT 43.9 42.6 39.0 41.5 41.3   < > 26.0* 27.0* 28.3* 27.0* 31.0*  MCV 81.4 82.9 83.3 83.7 83.1  --   --   --   --   --   --   PLT 290 257 229 220 232  --   --   --  223  --   --    < > = values in this interval not displayed.    Basic Metabolic Panel: Recent Labs  Lab 04/13/22 1456 04/14/22 0220 04/15/22 0234 04/17/22 0227 04/17/22 0815 04/17/22 0819  04/17/22 0928 04/17/22 1012 04/17/22 1033 04/17/22 1041 04/17/22 1101 04/17/22 1120 04/17/22 1151 04/17/22 1159  NA 136 138 136 136   < > 137 136   < > 136 136 137 137 139 138  K 3.8 4.0 4.0 4.0   < > 4.6 4.3   < > 5.2* 5.2* 5.1 5.0 4.3 4.3  CL 105 102 106 101  --  104 105  --  103  --   --  106  --  107  CO2 '24 25 24 24  '$ --   --   --   --   --   --   --   --   --   --   GLUCOSE 167* 137* 126* 131*  --  120* 137*  --  144*  --   --  162*  --  163*  BUN '17 16 13 14  '$ --  12 12  --  12  --   --  13  --  11  CREATININE 1.01 1.22 1.03 1.22  --  1.00 0.90  --  0.90  --   --  0.90  --  0.80  CALCIUM 8.5* 9.0 8.8* 9.2  --   --   --   --   --   --   --   --   --   --   MG  --  2.2  --   --   --   --   --   --   --   --   --   --   --   --    < > = values in this interval not displayed.   GFR: Estimated Creatinine Clearance: 102 mL/min (by C-G formula based on SCr of 0.8 mg/dL). Recent Labs  Lab 04/14/22 0220 04/15/22 0234 04/16/22 0307 04/17/22 0227  WBC 8.7 7.6 9.2 9.2    Liver Function Tests: Recent Labs  Lab 04/14/22 0220  AST 25  ALT 30  ALKPHOS 48  BILITOT 0.6  PROT 6.5  ALBUMIN 3.3*   No results for input(s): "LIPASE", "AMYLASE" in the last 168 hours. No results for input(s): "AMMONIA" in the last 168 hours.  ABG    Component Value Date/Time   PHART 7.261 (L) 04/17/2022 1151   PCO2ART 43.1 04/17/2022 1151   PO2ART 108 04/17/2022 1151   HCO3 19.4 (L) 04/17/2022 1151   TCO2 22 04/17/2022 1159   ACIDBASEDEF 7.0 (H) 04/17/2022 1151   O2SAT 97 04/17/2022 1151     Coagulation Profile: Recent Labs  Lab 04/13/22 1456 04/17/22 1253  INR 1.0 1.2    Cardiac Enzymes: No results for input(s): "CKTOTAL", "CKMB", "CKMBINDEX", "TROPONINI" in the last 168 hours.  HbA1C: Hgb A1c MFr Bld  Date/Time Value Ref Range Status  04/14/2022 02:20 AM 7.5 (H) 4.8 - 5.6 %  Final    Comment:    (NOTE) Pre diabetes:          5.7%-6.4%  Diabetes:              >6.4%  Glycemic  control for   <7.0% adults with diabetes     CBG: Recent Labs  Lab 04/16/22 0758 04/16/22 1204 04/16/22 1603 04/16/22 2043 04/17/22 1251  GLUCAP 167* 168* 86 154* 160*    Review of Systems:   Limited due to mental status.  Past Medical History:  He,  has a past medical history of Melanoma ([place]) (04/07/2019), SCC (squamous cell carcinoma) (06/30/2016), SCC (squamous cell carcinoma) (06/30/2016), and Squamous cell carcinoma of skin (12/2015).   Surgical History:   Past Surgical History:  Procedure Laterality Date   CHOLECYSTECTOMY     HERNIA REPAIR     LEFT HEART CATH AND CORONARY ANGIOGRAPHY N/A 04/14/2022   Procedure: LEFT HEART CATH AND CORONARY ANGIOGRAPHY;  Surgeon: Lorretta Harp, MD;  Location: [facility];  Service: Cardiovascular;  Laterality: N/A;   MELANOMA EXCISION       Social History:   reports that he has never smoked. He has never used smokeless tobacco. He reports current alcohol use. He reports that he does not use drugs.   Family History:  His family history is not on file.   Allergies Allergies  Allergen Reactions   Penicillins Other (See Comments)    Childhood allergy     Home Medications  Prior to Admission medications   Medication Sig Start Date End Date Taking? Authorizing Provider  ascorbic acid (VITAMIN C) 500 MG tablet Take 500 mg by mouth daily.   Yes [provider]  Cholecalciferol (VITAMIN D3) 50 MCG (2000 UT) TABS Take 1 tablet by mouth daily.   Yes [provider]  fluorouracil (EFUDEX) 5 % cream APPLY TO AFFECTED AREA(S) AT BEDTIME AS DIRECTED Patient taking differently: Apply 1 Application topically daily as needed (for rash). 03/08/21  Yes Susy Frizzle, MD  Multiple Vitamin (MULITIVITAMIN WITH MINERALS) TABS Take 1 tablet by mouth daily.   Yes [provider]  omega-3 acid ethyl esters (LOVAZA) 1 g capsule Take 1 g by mouth 2 (two) times daily.   Yes [provider]  saw palmetto  500 MG capsule Take 500 mg by mouth in the morning, at noon, in the evening, and at bedtime.   Yes [provider]  Turmeric (QC TUMERIC COMPLEX) 500 MG CAPS Take 1 capsule by mouth daily.   Yes [provider]  zinc gluconate 50 MG tablet Take 50 mg by mouth daily.   Yes [provider]  terbinafine (LAMISIL) 250 MG tablet Take 1 tablet (250 mg total) by mouth daily. Patient not taking: Reported on 01/28/2021 01/25/21   Susy Frizzle, MD     Critical care time: 38 min.      Julian Hy, DO 04/17/22 3:28 PM [facility]

## 2022-04-17 NOTE — Anesthesia Procedure Notes (Signed)
Procedure Name: Intubation Date/Time: 04/17/2022 8:06 AM  Performed by: Valda Favia, CRNAPre-anesthesia Checklist: Patient identified, Emergency Drugs available, Suction available and Patient being monitored Patient Re-evaluated:Patient Re-evaluated prior to induction Oxygen Delivery Method: Circle System Utilized Preoxygenation: Pre-oxygenation with 100% oxygen Induction Type: IV induction and Rapid sequence Laryngoscope Size: Glidescope and 4 Grade View: Grade II Tube type: Oral Tube size: 7.5 mm Number of attempts: 1 Airway Equipment and Method: Stylet Placement Confirmation: ETT inserted through vocal cords under direct vision, positive ETCO2 and breath sounds checked- equal and bilateral Secured at: 22 cm Tube secured with: Tape Dental Injury: Teeth and Oropharynx as per pre-operative assessment  Difficulty Due To: Difficult Airway- due to anterior larynx and Difficult Airway- due to limited oral opening

## 2022-04-17 NOTE — Progress Notes (Signed)
     [address]       [place] [address]             [phone]       No events  Vitals:   04/16/22 2016 04/17/22 0422  BP: 137/78 117/79  Pulse: 72 63  Resp: 19 18  Temp: 98.1 F (36.7 C) 98.1 F (36.7 C)  SpO2: 96%    Sinus EWOB  OR today for CABG with L radial artery harvest  Deloy Archey O Laresha Bacorn

## 2022-04-18 ENCOUNTER — Inpatient Hospital Stay (HOSPITAL_COMMUNITY): Payer: BC Managed Care – PPO

## 2022-04-18 ENCOUNTER — Encounter (HOSPITAL_COMMUNITY): Payer: Self-pay | Admitting: Thoracic Surgery (Cardiothoracic Vascular Surgery)

## 2022-04-18 DIAGNOSIS — R066 Hiccough: Secondary | ICD-10-CM | POA: Diagnosis not present

## 2022-04-18 DIAGNOSIS — Z951 Presence of aortocoronary bypass graft: Secondary | ICD-10-CM | POA: Diagnosis not present

## 2022-04-18 DIAGNOSIS — I214 Non-ST elevation (NSTEMI) myocardial infarction: Secondary | ICD-10-CM | POA: Diagnosis not present

## 2022-04-18 LAB — POCT I-STAT 7, (LYTES, BLD GAS, ICA,H+H)
Acid-base deficit: 2 mmol/L (ref 0.0–2.0)
Bicarbonate: 22.7 mmol/L (ref 20.0–28.0)
Calcium, Ion: 1.13 mmol/L — ABNORMAL LOW (ref 1.15–1.40)
HCT: 42 % (ref 39.0–52.0)
Hemoglobin: 14.3 g/dL (ref 13.0–17.0)
O2 Saturation: 96 %
Patient temperature: 37.3
Potassium: 4 mmol/L (ref 3.5–5.1)
Sodium: 139 mmol/L (ref 135–145)
TCO2: 24 mmol/L (ref 22–32)
pCO2 arterial: 38 mmHg (ref 32–48)
pH, Arterial: 7.385 (ref 7.35–7.45)
pO2, Arterial: 87 mmHg (ref 83–108)

## 2022-04-18 LAB — GLUCOSE, CAPILLARY
Glucose-Capillary: 101 mg/dL — ABNORMAL HIGH (ref 70–99)
Glucose-Capillary: 105 mg/dL — ABNORMAL HIGH (ref 70–99)
Glucose-Capillary: 107 mg/dL — ABNORMAL HIGH (ref 70–99)
Glucose-Capillary: 121 mg/dL — ABNORMAL HIGH (ref 70–99)
Glucose-Capillary: 127 mg/dL — ABNORMAL HIGH (ref 70–99)
Glucose-Capillary: 133 mg/dL — ABNORMAL HIGH (ref 70–99)
Glucose-Capillary: 135 mg/dL — ABNORMAL HIGH (ref 70–99)
Glucose-Capillary: 139 mg/dL — ABNORMAL HIGH (ref 70–99)
Glucose-Capillary: 141 mg/dL — ABNORMAL HIGH (ref 70–99)
Glucose-Capillary: 141 mg/dL — ABNORMAL HIGH (ref 70–99)
Glucose-Capillary: 150 mg/dL — ABNORMAL HIGH (ref 70–99)
Glucose-Capillary: 154 mg/dL — ABNORMAL HIGH (ref 70–99)
Glucose-Capillary: 158 mg/dL — ABNORMAL HIGH (ref 70–99)
Glucose-Capillary: 161 mg/dL — ABNORMAL HIGH (ref 70–99)

## 2022-04-18 LAB — CBC
HCT: 32.1 % — ABNORMAL LOW (ref 39.0–52.0)
HCT: 31.1 % — ABNORMAL LOW (ref 39.0–52.0)
Hemoglobin: 10.9 g/dL — ABNORMAL LOW (ref 13.0–17.0)
Hemoglobin: 10.7 g/dL — ABNORMAL LOW (ref 13.0–17.0)
MCH: 27.9 pg (ref 26.0–34.0)
MCH: 29 pg (ref 26.0–34.0)
MCHC: 34 g/dL (ref 30.0–36.0)
MCHC: 34.4 g/dL (ref 30.0–36.0)
MCV: 82.1 fL (ref 80.0–100.0)
MCV: 84.3 fL (ref 80.0–100.0)
Platelets: 205 10*3/uL (ref 150–400)
Platelets: 158 10*3/uL (ref 150–400)
RBC: 3.91 MIL/uL — ABNORMAL LOW (ref 4.22–5.81)
RBC: 3.69 MIL/uL — ABNORMAL LOW (ref 4.22–5.81)
RDW: 13.3 % (ref 11.5–15.5)
RDW: 13.9 % (ref 11.5–15.5)
WBC: 17.3 10*3/uL — ABNORMAL HIGH (ref 4.0–10.5)
WBC: 15.8 10*3/uL — ABNORMAL HIGH (ref 4.0–10.5)
nRBC: 0 % (ref 0.0–0.2)
nRBC: 0 % (ref 0.0–0.2)

## 2022-04-18 LAB — BASIC METABOLIC PANEL
Anion gap: 11 (ref 5–15)
Anion gap: 6 (ref 5–15)
BUN: 10 mg/dL (ref 8–23)
BUN: 10 mg/dL (ref 8–23)
CO2: 23 mmol/L (ref 22–32)
CO2: 25 mmol/L (ref 22–32)
Calcium: 7.9 mg/dL — ABNORMAL LOW (ref 8.9–10.3)
Calcium: 7.8 mg/dL — ABNORMAL LOW (ref 8.9–10.3)
Chloride: 103 mmol/L (ref 98–111)
Chloride: 105 mmol/L (ref 98–111)
Creatinine, Ser: 1.04 mg/dL (ref 0.61–1.24)
Creatinine, Ser: 0.71 mg/dL (ref 0.61–1.24)
GFR, Estimated: >60 mL/min (ref >60)
GFR, Estimated: >60 mL/min (ref >60)
Glucose, Bld: 106 mg/dL — ABNORMAL HIGH (ref 70–99)
Glucose, Bld: 142 mg/dL — ABNORMAL HIGH (ref 70–99)
Potassium: 4 mmol/L (ref 3.5–5.1)
Potassium: 4.2 mmol/L (ref 3.5–5.1)
Sodium: 137 mmol/L (ref 135–145)
Sodium: 136 mmol/L (ref 135–145)

## 2022-04-18 LAB — ECHO INTRAOPERATIVE TEE
Height: 69 in
Weight: 3078.4 oz

## 2022-04-18 LAB — MAGNESIUM
Magnesium: 2.6 mg/dL — ABNORMAL HIGH (ref 1.7–2.4)
Magnesium: 2.4 mg/dL (ref 1.7–2.4)

## 2022-04-18 MED ORDER — CLOPIDOGREL BISULFATE 75 MG PO TABS
75.0000 mg | ORAL_TABLET | Freq: Every day | ORAL | Status: DC
  Administered 2022-04-19 – 2022-04-21 (×3): 75 mg via ORAL
  Filled 2022-04-18 (×3): qty 1

## 2022-04-18 MED ORDER — POTASSIUM CHLORIDE CRYS ER 10 MEQ PO TBCR
10.0000 meq | EXTENDED_RELEASE_TABLET | Freq: Every day | ORAL | Status: DC
  Administered 2022-04-18 – 2022-04-21 (×4): 10 meq via ORAL
  Filled 2022-04-18 (×4): qty 1

## 2022-04-18 MED ORDER — ENOXAPARIN SODIUM 40 MG/0.4ML IJ SOSY
40.0000 mg | PREFILLED_SYRINGE | Freq: Every day | INTRAMUSCULAR | Status: DC
  Administered 2022-04-18 – 2022-04-20 (×3): 40 mg via SUBCUTANEOUS
  Filled 2022-04-18 (×3): qty 0.4

## 2022-04-18 MED ORDER — ASPIRIN 81 MG PO TBEC
81.0000 mg | DELAYED_RELEASE_TABLET | Freq: Every day | ORAL | Status: DC
  Administered 2022-04-19 – 2022-04-21 (×3): 81 mg via ORAL
  Filled 2022-04-18 (×3): qty 1

## 2022-04-18 MED ORDER — INSULIN ASPART 100 UNIT/ML IJ SOLN: 1.0000 [IU] | INTRAMUSCULAR | Status: DC

## 2022-04-18 MED ORDER — INSULIN ASPART 100 UNIT/ML IJ SOLN
0.0000 [IU] | INTRAMUSCULAR | Status: DC
  Administered 2022-04-18 (×2): 2 [IU] via SUBCUTANEOUS
  Administered 2022-04-19: 4 [IU] via SUBCUTANEOUS
  Administered 2022-04-19 (×2): 2 [IU] via SUBCUTANEOUS

## 2022-04-18 MED ORDER — FUROSEMIDE 20 MG PO TABS
20.0000 mg | ORAL_TABLET | Freq: Every day | ORAL | Status: DC
  Administered 2022-04-18: 20 mg via ORAL
  Filled 2022-04-18: qty 1

## 2022-04-18 MED ORDER — INSULIN DETEMIR 100 UNIT/ML ~~LOC~~ SOLN
10.0000 [IU] | Freq: Two times a day (BID) | SUBCUTANEOUS | Status: DC
  Administered 2022-04-18: 10 [IU] via SUBCUTANEOUS
  Filled 2022-04-18 (×2): qty 0.1

## 2022-04-18 MED ORDER — INSULIN ASPART 100 UNIT/ML IJ SOLN: 0.0000 [IU] | INTRAMUSCULAR | Status: DC

## 2022-04-18 MED ORDER — AMLODIPINE BESYLATE 5 MG PO TABS
2.5000 mg | ORAL_TABLET | Freq: Every day | ORAL | Status: DC
  Administered 2022-04-18 – 2022-04-19 (×2): 2.5 mg via ORAL
  Filled 2022-04-18 (×2): qty 1

## 2022-04-18 MED ORDER — FUROSEMIDE 40 MG PO TABS
40.0000 mg | ORAL_TABLET | Freq: Every day | ORAL | Status: DC
  Administered 2022-04-19 – 2022-04-21 (×3): 40 mg via ORAL
  Filled 2022-04-18 (×3): qty 1

## 2022-04-18 MED FILL — Potassium Chloride Inj 2 mEq/ML: INTRAVENOUS | Qty: 40 | Status: AC

## 2022-04-18 MED FILL — Sodium Chloride IV Soln 0.9%: INTRAVENOUS | Qty: 2000 | Status: AC

## 2022-04-18 MED FILL — Calcium Chloride Inj 10%: INTRAVENOUS | Qty: 10 | Status: AC

## 2022-04-18 MED FILL — Sodium Bicarbonate IV Soln 8.4%: INTRAVENOUS | Qty: 50 | Status: AC

## 2022-04-18 MED FILL — Mannitol IV Soln 20%: INTRAVENOUS | Qty: 500 | Status: AC

## 2022-04-18 MED FILL — Electrolyte-R (PH 7.4) Solution: INTRAVENOUS | Qty: 4000 | Status: AC

## 2022-04-18 MED FILL — Heparin Sodium (Porcine) Inj 1000 Unit/ML: Qty: 1000 | Status: AC

## 2022-04-18 MED FILL — Heparin Sodium (Porcine) Inj 1000 Unit/ML: INTRAMUSCULAR | Qty: 30 | Status: AC

## 2022-04-18 MED FILL — Lidocaine HCl Local Preservative Free (PF) Inj 2%: INTRAMUSCULAR | Qty: 14 | Status: AC

## 2022-04-18 NOTE — TOC Progression Note (Signed)
Transition of Care [facility]) - Progression Note    Patient Details  Name: Dustin Wood MRN: [id number] Date of Birth: 02/20/1956  Transition of Care [facility]) CM/SW Contact  Graves-Bigelow, Ocie Cornfield, RN Phone Number: 04/18/2022, 3:38 PM  Clinical Narrative:  Patient was discussed in morning rounds- POD-1 CABG. Patient was on 10 Liter HF at the time of rounds with CT's in place. Case Manager will continue to follow for transition of care needs.   Expected Discharge Plan: Home/Self Care Barriers to Discharge: Continued Medical Work up  Expected Discharge Plan and Services Expected Discharge Plan: Home/Self Care In-house Referral: NA Discharge Planning Services: CM Consult   Living arrangements for the past 2 months: Single Family Home                   DME Agency: NA       HH Arranged: NA    Readmission Risk Interventions     No data to display

## 2022-04-18 NOTE — Progress Notes (Signed)
      [address]       [place] [address]             [phone]      POD # 1 CABG x 2  No new issues today  BP 114/65   Pulse 69   Temp 98.1 F (36.7 C) (Oral)   Resp (!) 31   Ht '5\' 9"'$  (1.753 m)   Wt 91.5 kg   SpO2 96%   BMI 29.79 kg/m   Intake/Output Summary (Last 24 hours) at 04/18/2022 1653 Last data filed at 04/18/2022 1600 Gross per 24 hour  Intake 2199.67 ml  Output 1465 ml  Net 734.67 ml   CBG well controlled  Continue current care  Skyelynn Rambeau C. Roxan Hockey, MD [facility] [phone]

## 2022-04-18 NOTE — Progress Notes (Signed)
NAME:  Dustin Wood, MRN:  [id number], DOB:  [birth year], LOS: 5 ADMISSION DATE:  04/13/2022, CONSULTATION DATE:  9/28 REFERRING MD:  Kipp Brood, CHIEF COMPLAINT:  post-CABG   History of Present Illness:  Dustin Wood is a 66 y/o gentleman with a history of psoriasis, melanoma who presented with CP radiating to his left shoulder and arm with associated nausea and diaphoresis on 9/24. He underwent LHC on 9/25 demonstrating severe LAD and first diagonal CAD, not amenable to PCI.  Echo demonstrated preserved EF but inferior wall hypokinesis. He was referred for CABG. On 9/28 he had 2-v CABG, LIMA>LAD & L radial > diag.  He is a never smoker. He was diagnosed with HLD & DM this admission (A1c 7.5).  Pertinent  Medical History  CAD Melanoma psoriasis  Significant Hospital Events: Including procedures, antibiotic start and stop dates in addition to other pertinent events   9/24 admitted with inferior wall NSTEMI 9/25 cath, 95% stenosis of prox LAD extending into first diag 9/28 2-vessel CABG on pump, extubated post-op  Interim History / Subjective:  This morning having occasional hiccups. Off pressors overnight. Pain controlled.   Objective   Blood pressure (!) 140/66, pulse 72, temperature 97.9 F (36.6 C), temperature source Oral, resp. rate (!) 25, height '5\' 9"'$  (1.753 m), weight 91.5 kg, SpO2 94 %. CVP:  [13 mmHg-57 mmHg] 22 mmHg  Vent Mode: PSV;CPAP FiO2 (%):  [40 %-60 %] 40 % Set Rate:  [4 bmp-12 bmp] 4 bmp Vt Set:  [560 mL] 560 mL PEEP:  [5 cmH20] 5 cmH20 Pressure Support:  [5 cmH20-10 cmH20] 5 cmH20   Intake/Output Summary (Last 24 hours) at 04/18/2022 1002 Last data filed at 04/18/2022 0900 Gross per 24 hour  Intake 4219.67 ml  Output 3635 ml  Net 584.67 ml    Filed Weights   04/15/22 0500 04/17/22 0422 04/18/22 0600  Weight: 89.2 kg 87.3 kg 91.5 kg    Examination: General: middle aged man sitting up in the recliner in NAD HENT: [place]/AT, eyes anicteric Neck: RIJ CVC Lungs:  breathing comfortably on [place], decreased basilar breath sounds, CTAB anteriorly Cardiovascular: S1S2, RRR, not pacing. Mediastinal tubes in place, minimal bloody drainage.  Abdomen: soft, NT Extremities: minimal pretibial edema, no cyanosis Neuro: awake, alert, answering questions appropriately, moving all extremities GU: foley draining clear yellow urine  CXR personally reviewed> small left effusion, low lung volumes  BUN 10 Cr 1.04 WBC 17 H/H 10.9/32.1 Platelets 205  Resolved Hospital Problem list     Assessment & Plan:  CAD with inferior wall NSTEMI, s/p CABG x 2. Risk factors DM & HLD. -post-op care per TCTS -complete post-op antibiotics -off pressors this morning -amlodipine for radial artery graft -metoprolol, aspirin, statin -pain control with morphine, oxycodone, tramadol PRN -start plavix once tubes and wires out-- defer timing to TCTS  Post-op vent management -wean supplemental O2 as able to maintain SpO2 >90% -pulmonary hygiene, OOB mobility -may need diuresis  DM, hyperglycemia. A1c 7.5 -transition off insulin gtt to basal bolus insulin -needs metformin most likely at discharge and close follow up with PCP  Reactive leukocytosis Mild acute anemia, expected post-op -monitor  Hiccups -if refractory can trial thorazine, baclofen, or reglan PRN  Wife updated at bedside.  Best Practice (right click and "Reselect all SmartList Selections" daily)   Diet/type: clear liquids DVT prophylaxis: LMWH GI prophylaxis: PPI Lines: Central line and Arterial Line Foley:  Yes, and it is still needed Code Status:  full code Last date of  multidisciplinary goals of care discussion '[ ]'$   Labs   CBC: Recent Labs  Lab 04/14/22 0220 04/15/22 0234 04/16/22 0307 04/17/22 0227 04/17/22 0815 04/17/22 1126 04/17/22 1151 04/17/22 1253 04/17/22 1254 04/17/22 1748 04/17/22 1825 04/17/22 2132 04/18/22 0343 04/18/22 0356  WBC 8.7   < > 9.2 9.2  --   --   --  21.0*  --    --  22.0*  --  17.3*  --   NEUTROABS 4.9  --   --   --   --   --   --   --   --   --   --   --   --   --   HGB 14.5   < > 14.0 14.2   < > 9.7*   < > 12.4*   < > 10.9* 12.0* 9.9* 10.9* 14.3  HCT 42.6   < > 41.5 41.3   < > 28.3*   < > 36.3*   < > 32.0* 34.3* 29.0* 32.1* 42.0  MCV 82.9   < > 83.7 83.1  --   --   --  84.2  --   --  82.1  --  82.1  --   PLT 257   < > 220 232  --  223  --  176  --   --  227  --  205  --    < > = values in this interval not displayed.     Basic Metabolic Panel: Recent Labs  Lab 04/14/22 0220 04/15/22 0234 04/17/22 0227 04/17/22 0815 04/17/22 1033 04/17/22 1041 04/17/22 1120 04/17/22 1151 04/17/22 1159 04/17/22 1254 04/17/22 1748 04/17/22 1825 04/17/22 2132 04/18/22 0343 04/18/22 0356  NA 138 136 136   < > 136   < > 137   < > 138   < > 139 139 138 137 139  K 4.0 4.0 4.0   < > 5.2*   < > 5.0   < > 4.3   < > 4.2 4.3 4.2 4.0 4.0  CL 102 106 101   < > 103  --  106  --  107  --   --  107  --  103  --   CO2 '25 24 24  '$ --   --   --   --   --   --   --   --  20*  --  23  --   GLUCOSE 137* 126* 131*   < > 144*  --  162*  --  163*  --   --  166*  --  106*  --   BUN '16 13 14   '$ < > 12  --  13  --  11  --   --  10  --  10  --   CREATININE 1.22 1.03 1.22   < > 0.90  --  0.90  --  0.80  --   --  1.18  --  1.04  --   CALCIUM 9.0 8.8* 9.2  --   --   --   --   --   --   --   --  7.8*  --  7.9*  --   MG 2.2  --   --   --   --   --   --   --   --   --   --  2.8*  --  2.6*  --    < > = values in  this interval not displayed.    GFR: Estimated Creatinine Clearance: 80.2 mL/min (by C-G formula based on SCr of 1.04 mg/dL). Recent Labs  Lab 04/17/22 0227 04/17/22 1253 04/17/22 1825 04/18/22 0343  WBC 9.2 21.0* 22.0* 17.3*     Liver Function Tests: Recent Labs  Lab 04/14/22 0220  AST 25  ALT 30  ALKPHOS 48  BILITOT 0.6  PROT 6.5  ALBUMIN 3.3*    No results for input(s): "LIPASE", "AMYLASE" in the last 168 hours. No results for input(s): "AMMONIA" in the  last 168 hours.  ABG    Component Value Date/Time   PHART 7.385 04/18/2022 0356   PCO2ART 38.0 04/18/2022 0356   PO2ART 87 04/18/2022 0356   HCO3 22.7 04/18/2022 0356   TCO2 24 04/18/2022 0356   ACIDBASEDEF 2.0 04/18/2022 0356   O2SAT 96 04/18/2022 0356     Coagulation Profile: Recent Labs  Lab 04/13/22 1456 04/17/22 1253  INR 1.0 1.2       Julian Hy, DO 04/18/22 4:03 PM [facility]

## 2022-04-18 NOTE — Progress Notes (Addendum)
TCTS DAILY ICU PROGRESS NOTE                   [address]            [place] [address]          [phone]   1 Day Post-Op Procedure(s) (LRB): CORONARY ARTERY BYPASS GRAFTING (CABG) X TWO, USING LEFT INTERNAL MAMMARY ARTERY AND LEFT ARM RADIAL ARTERY (N/A) LEFT RADIAL ARTERY HARVEST (N/A) TRANSESOPHAGEAL ECHOCARDIOGRAM (TEE) (N/A)  Total Length of Stay:  LOS: 5 days   Subjective: Feels pretty well  Objective: Vital signs in last 24 hours: Temp:  [96.1 F (35.6 C)-99.1 F (37.3 C)] 98.8 F (37.1 C) (09/29 0600) Pulse Rate:  [63-93] 72 (09/29 0800) Cardiac Rhythm: Normal sinus rhythm (09/29 0000) Resp:  [12-31] 24 (09/29 0800) BP: (89-129)/(56-74) 110/69 (09/29 0800) SpO2:  [89 %-97 %] 94 % (09/29 0800) Arterial Line BP: (84-149)/(41-100) 121/60 (09/29 0800) FiO2 (%):  [40 %-60 %] 40 % (09/28 1558) Weight:  [91.5 kg] 91.5 kg (09/29 0600)  Filed Weights   04/15/22 0500 04/17/22 0422 04/18/22 0600  Weight: 89.2 kg 87.3 kg 91.5 kg    Weight change: 4.228 kg   Hemodynamic parameters for last 24 hours: CVP:  [13 mmHg-57 mmHg] 20 mmHg  Intake/Output from previous day: 09/28 0701 - 09/29 0700 In: 5519.7 [I.V.:4105; Blood:270; IV Piggyback:1144.7] Out: 3485 [Urine:2565; Blood:550; Chest Tube:370]  Intake/Output this shift: Total I/O In: -  Out: 160 [Chest Tube:160]  Current Meds: Scheduled Meds:  acetaminophen  1,000 mg Oral Q6H   Or   acetaminophen (TYLENOL) oral liquid 160 mg/5 mL  1,000 mg Per Tube Q6H   aspirin EC  325 mg Oral Daily   Or   aspirin  324 mg Per Tube Daily   bisacodyl  10 mg Oral Daily   Or   bisacodyl  10 mg Rectal Daily   Chlorhexidine Gluconate Cloth  6 each Topical Daily   docusate sodium  200 mg Oral Daily   metoprolol tartrate  12.5 mg Oral BID   Or   metoprolol tartrate  12.5 mg Per Tube BID   mupirocin ointment  1 Application Nasal BID   [START ON 04/19/2022] pantoprazole  40 mg Oral Daily   rosuvastatin  40 mg  Oral Daily   sodium chloride flush  3 mL Intravenous Q12H   Continuous Infusions:  sodium chloride 10 mL/hr at 04/18/22 0700   sodium chloride     sodium chloride      ceFAZolin (ANCEF) IV Stopped (04/18/22 1224)   dexmedetomidine (PRECEDEX) IV infusion Stopped (04/17/22 1439)   DOBUTamine     insulin 6 mL/hr at 04/18/22 0700   lactated ringers     lactated ringers     lactated ringers 20 mL/hr at 04/18/22 0700   niCARDipine 2.5 mg/hr (04/18/22 0700)   norepinephrine (LEVOPHED) Adult infusion Stopped (04/18/22 0357)   phenylephrine (NEO-SYNEPHRINE) Adult infusion 50 mcg/min (04/18/22 0700)   PRN Meds:.sodium chloride, dextrose, lactated ringers, metoprolol tartrate, morphine injection, ondansetron (ZOFRAN) IV, mouth rinse, oxyCODONE, sodium chloride flush, traMADol  General appearance: alert, cooperative, and no distress Heart: regular rate and rhythm Lungs: clear to auscultation bilaterally Abdomen: benign Extremities: no edema Wound: dressings CDI, left hand N/V intact  Lab Results: CBC: Recent Labs    04/17/22 1825 04/17/22 2132 04/18/22 0343 04/18/22 0356  WBC 22.0*  --  17.3*  --   HGB 12.0*   < > 10.9* 14.3  HCT  34.3*   < > 32.1* 42.0  PLT 227  --  205  --    < > = values in this interval not displayed.   BMET:  Recent Labs    04/17/22 1825 04/17/22 2132 04/18/22 0343 04/18/22 0356  NA 139   < > 137 139  K 4.3   < > 4.0 4.0  CL 107  --  103  --   CO2 20*  --  23  --   GLUCOSE 166*  --  106*  --   BUN 10  --  10  --   CREATININE 1.18  --  1.04  --   CALCIUM 7.8*  --  7.9*  --    < > = values in this interval not displayed.    CMET: Lab Results  Component Value Date   WBC 17.3 (H) 04/18/2022   HGB 14.3 04/18/2022   HCT 42.0 04/18/2022   PLT 205 04/18/2022   GLUCOSE 106 (H) 04/18/2022   CHOL 173 04/14/2022   TRIG 349 (H) 04/14/2022   HDL 26 (L) 04/14/2022   LDLCALC 77 04/14/2022   ALT 30 04/14/2022   AST 25 04/14/2022   NA 139 04/18/2022   K  4.0 04/18/2022   CL 103 04/18/2022   CREATININE 1.04 04/18/2022   BUN 10 04/18/2022   CO2 23 04/18/2022   TSH 3.869 04/16/2022   PSA 0.98 06/22/2020   INR 1.2 04/17/2022   HGBA1C 7.5 (H) 04/14/2022      PT/INR:  Recent Labs    04/17/22 1253  LABPROT 15.1  INR 1.2   Radiology: [facility] Chest Port 1 View  Result Date: 04/17/2022 CLINICAL DATA:  Status post CABG EXAM: PORTABLE CHEST 1 VIEW COMPARISON:  Radiograph 04/13/2022 FINDINGS: Recent median sternotomy and postsurgical changes of CABG. Endotracheal tube tip overlies the midthoracic trachea. Right neck catheter sheath overlies the distal right IJ. Orogastric tube tip and side port overlie the stomach. There is a left basilar chest tube and a mediastinal drain in place. There are mild interstitial opacities. No evidence of pneumothorax. Trace left pleural effusion. IMPRESSION: Postoperative chest with mild interstitial edema and trace left pleural effusion. Lines and tubes as described above. Electronically Signed   By: Maurine Simmering M.D.   On: 04/17/2022 13:12     Assessment/Plan: S/P Procedure(s) (LRB): CORONARY ARTERY BYPASS GRAFTING (CABG) X TWO, USING LEFT INTERNAL MAMMARY ARTERY AND LEFT ARM RADIAL ARTERY (N/A) LEFT RADIAL ARTERY HARVEST (N/A) TRANSESOPHAGEAL ECHOCARDIOGRAM (TEE) (N/A) POD#1  1 afeb< S BP 89-129,good hemodynamics off neo/levo 2 ABG- normal, O2 sats good on 3 good UOP, weight up approc 3 KG, gentle diuresis 4 CT 550 cc, serosang, prob d/c soon 5 CXR small left effus, atx 6 normal renal fxn 7 reactive leukocytosis, improving trend 8 Exp ABLA, stable 9 EKG unremarkable 9 norvasc for radial, d/c cardene 10 routine pulm hygiene, rehab  Dustin Giovanni PA-C Pager [phone] 04/18/2022 8:07 AM   Agree with above. Overall doing well. We will transition to amlodipine for radial artery harvest We will remove epicardial pacing wires Starting Plavix today. We will diuresis today. We will transfer to floor when  on lower oxygen requirements.  Dustin Wood

## 2022-04-19 ENCOUNTER — Inpatient Hospital Stay (HOSPITAL_COMMUNITY): Payer: BC Managed Care – PPO

## 2022-04-19 LAB — GLUCOSE, CAPILLARY
Glucose-Capillary: 134 mg/dL — ABNORMAL HIGH (ref 70–99)
Glucose-Capillary: 147 mg/dL — ABNORMAL HIGH (ref 70–99)
Glucose-Capillary: 150 mg/dL — ABNORMAL HIGH (ref 70–99)
Glucose-Capillary: 152 mg/dL — ABNORMAL HIGH (ref 70–99)
Glucose-Capillary: 155 mg/dL — ABNORMAL HIGH (ref 70–99)
Glucose-Capillary: 168 mg/dL — ABNORMAL HIGH (ref 70–99)

## 2022-04-19 LAB — CBC
HCT: 31.4 % — ABNORMAL LOW (ref 39.0–52.0)
Hemoglobin: 10.5 g/dL — ABNORMAL LOW (ref 13.0–17.0)
MCH: 28.4 pg (ref 26.0–34.0)
MCHC: 33.4 g/dL (ref 30.0–36.0)
MCV: 84.9 fL (ref 80.0–100.0)
Platelets: 161 10*3/uL (ref 150–400)
RBC: 3.7 MIL/uL — ABNORMAL LOW (ref 4.22–5.81)
RDW: 14 % (ref 11.5–15.5)
WBC: 15.2 10*3/uL — ABNORMAL HIGH (ref 4.0–10.5)
nRBC: 0 % (ref 0.0–0.2)

## 2022-04-19 LAB — BASIC METABOLIC PANEL
Anion gap: 9 (ref 5–15)
BUN: 10 mg/dL (ref 8–23)
CO2: 24 mmol/L (ref 22–32)
Calcium: 8.5 mg/dL — ABNORMAL LOW (ref 8.9–10.3)
Chloride: 103 mmol/L (ref 98–111)
Creatinine, Ser: 0.96 mg/dL (ref 0.61–1.24)
GFR, Estimated: >60 mL/min (ref >60)
Glucose, Bld: 153 mg/dL — ABNORMAL HIGH (ref 70–99)
Potassium: 3.9 mmol/L (ref 3.5–5.1)
Sodium: 136 mmol/L (ref 135–145)

## 2022-04-19 MED ORDER — SODIUM CHLORIDE 0.9 % IV SOLN: 250.0000 mL | INTRAVENOUS | Status: DC | PRN

## 2022-04-19 MED ORDER — INSULIN ASPART 100 UNIT/ML IJ SOLN
0.0000 [IU] | Freq: Three times a day (TID) | INTRAMUSCULAR | Status: DC
  Administered 2022-04-19 – 2022-04-20 (×3): 2 [IU] via SUBCUTANEOUS
  Administered 2022-04-20 (×2): 3 [IU] via SUBCUTANEOUS
  Administered 2022-04-21: 2 [IU] via SUBCUTANEOUS

## 2022-04-19 MED ORDER — MAGNESIUM HYDROXIDE 400 MG/5ML PO SUSP: 30.0000 mL | Freq: Every day | ORAL | Status: DC | PRN

## 2022-04-19 MED ORDER — SODIUM CHLORIDE 0.9% FLUSH: 3.0000 mL | INTRAVENOUS | Status: DC | PRN

## 2022-04-19 MED ORDER — SODIUM CHLORIDE 0.9% FLUSH
3.0000 mL | Freq: Two times a day (BID) | INTRAVENOUS | Status: DC
  Administered 2022-04-19 – 2022-04-21 (×4): 3 mL via INTRAVENOUS

## 2022-04-19 MED ORDER — INSULIN ASPART 100 UNIT/ML IJ SOLN: 0.0000 [IU] | Freq: Every day | INTRAMUSCULAR | Status: DC

## 2022-04-19 MED ORDER — ~~LOC~~ CARDIAC SURGERY, PATIENT & FAMILY EDUCATION
Freq: Once | Status: AC
  Administered 2022-04-19: 1

## 2022-04-19 MED ORDER — ALUM & MAG HYDROXIDE-SIMETH 200-200-20 MG/5ML PO SUSP: 15.0000 mL | Freq: Four times a day (QID) | ORAL | Status: DC | PRN

## 2022-04-19 NOTE — Progress Notes (Signed)
2 Days Post-Op Procedure(s) (LRB): CORONARY ARTERY BYPASS GRAFTING (CABG) X TWO, USING LEFT INTERNAL MAMMARY ARTERY AND LEFT ARM RADIAL ARTERY (N/A) LEFT RADIAL ARTERY HARVEST (N/A) TRANSESOPHAGEAL ECHOCARDIOGRAM (TEE) (N/A) Subjective: No complaints, denies pain  Objective: Vital signs in last 24 hours: Temp:  [98.1 F (36.7 C)-98.8 F (37.1 C)] 98.5 F (36.9 C) (09/30 0300) Pulse Rate:  [66-87] 87 (09/30 0700) Cardiac Rhythm: Normal sinus rhythm (09/30 0809) Resp:  [13-32] 31 (09/30 0700) BP: (100-141)/(60-76) 139/74 (09/30 0600) SpO2:  [93 %-99 %] 94 % (09/30 0700) Arterial Line BP: (102-137)/(59-93) 124/67 (09/29 1530) Weight:  [93.6 kg] 93.6 kg (09/30 0328)  Hemodynamic parameters for last 24 hours: CVP:  [17 mmHg-23 mmHg] 20 mmHg  Intake/Output from previous day: 09/29 0701 - 09/30 0700 In: 1075.6 [I.V.:874.9; IV Piggyback:200.7] Out: 1080 [Urine:670; Chest Tube:410] Intake/Output this shift: Total I/O In: -  Out: 360 [Urine:300; Chest Tube:60]  General appearance: alert, cooperative, and no distress Neurologic: intact Heart: regular rate and rhythm Lungs: diminished breath sounds bibasilar Abdomen: normal findings: soft, non-tender  Lab Results: Recent Labs    04/18/22 1700 04/19/22 0345  WBC 15.8* 15.2*  HGB 10.7* 10.5*  HCT 31.1* 31.4*  PLT 158 161   BMET:  Recent Labs    04/18/22 1700 04/19/22 0345  NA 136 136  K 4.2 3.9  CL 105 103  CO2 25 24  GLUCOSE 142* 153*  BUN 10 10  CREATININE 0.71 0.96  CALCIUM 7.8* 8.5*    PT/INR:  Recent Labs    04/17/22 1253  LABPROT 15.1  INR 1.2   ABG    Component Value Date/Time   PHART 7.385 04/18/2022 0356   HCO3 22.7 04/18/2022 0356   TCO2 24 04/18/2022 0356   ACIDBASEDEF 2.0 04/18/2022 0356   O2SAT 96 04/18/2022 0356   CBG (last 3)  Recent Labs    04/18/22 1920 04/19/22 0007 04/19/22 0313  GLUCAP 135* 150* 168*    Assessment/Plan: S/P Procedure(s) (LRB): CORONARY ARTERY BYPASS  GRAFTING (CABG) X TWO, USING LEFT INTERNAL MAMMARY ARTERY AND LEFT ARM RADIAL ARTERY (N/A) LEFT RADIAL ARTERY HARVEST (N/A) TRANSESOPHAGEAL ECHOCARDIOGRAM (TEE) (N/A) POD # 2 NEURO- intact CV- in SR, BP OK  ASA + plavix  Rosuvastatin  Beta blocker RESP- down to 2L [place]  Diminished BS in bases- atelectasis +/- effusion RENAL- creatinine normal  Weight still up- continue diuresis ENDO- CBG mildly elevated  Change SSI to AC + HS and continue to monitor GI- tolerating diet Anemia secondary to ABL- mild, monitor SCD + enoxaparin for DVT prophylaxis DC chest tubes and central line Transfer to 4e   LOS: 6 days    Melrose Nakayama 04/19/2022

## 2022-04-19 NOTE — Progress Notes (Signed)
Pt arrived to 4e from 2h. Pt oriented to room and staff. Vitals obtained. CHG bath done. Family at bedside.

## 2022-04-20 ENCOUNTER — Inpatient Hospital Stay (HOSPITAL_COMMUNITY): Payer: BC Managed Care – PPO

## 2022-04-20 LAB — CBC
HCT: 34 % — ABNORMAL LOW (ref 39.0–52.0)
Hemoglobin: 11.4 g/dL — ABNORMAL LOW (ref 13.0–17.0)
MCH: 28.4 pg (ref 26.0–34.0)
MCHC: 33.5 g/dL (ref 30.0–36.0)
MCV: 84.6 fL (ref 80.0–100.0)
Platelets: 189 10*3/uL (ref 150–400)
RBC: 4.02 MIL/uL — ABNORMAL LOW (ref 4.22–5.81)
RDW: 14.1 % (ref 11.5–15.5)
WBC: 14.6 10*3/uL — ABNORMAL HIGH (ref 4.0–10.5)
nRBC: 0 % (ref 0.0–0.2)

## 2022-04-20 LAB — BASIC METABOLIC PANEL
Anion gap: 8 (ref 5–15)
BUN: 13 mg/dL (ref 8–23)
CO2: 22 mmol/L (ref 22–32)
Calcium: 8.4 mg/dL — ABNORMAL LOW (ref 8.9–10.3)
Chloride: 106 mmol/L (ref 98–111)
Creatinine, Ser: 0.92 mg/dL (ref 0.61–1.24)
GFR, Estimated: >60 mL/min (ref >60)
Glucose, Bld: 140 mg/dL — ABNORMAL HIGH (ref 70–99)
Potassium: 4 mmol/L (ref 3.5–5.1)
Sodium: 136 mmol/L (ref 135–145)

## 2022-04-20 LAB — TYPE AND SCREEN
ABO/RH(D): B POS
Antibody Screen: NEGATIVE
Unit division: 0
Unit division: 0

## 2022-04-20 LAB — GLUCOSE, CAPILLARY
Glucose-Capillary: 151 mg/dL — ABNORMAL HIGH (ref 70–99)
Glucose-Capillary: 143 mg/dL — ABNORMAL HIGH (ref 70–99)
Glucose-Capillary: 142 mg/dL — ABNORMAL HIGH (ref 70–99)
Glucose-Capillary: 187 mg/dL — ABNORMAL HIGH (ref 70–99)
Glucose-Capillary: 189 mg/dL — ABNORMAL HIGH (ref 70–99)
Glucose-Capillary: 158 mg/dL — ABNORMAL HIGH (ref 70–99)

## 2022-04-20 LAB — BPAM RBC
Blood Product Expiration Date: 202310232359
Blood Product Expiration Date: 202310232359
Unit Type and Rh: 7300
Unit Type and Rh: 7300

## 2022-04-20 MED ORDER — METFORMIN HCL 500 MG PO TABS
500.0000 mg | ORAL_TABLET | Freq: Two times a day (BID) | ORAL | Status: DC
  Administered 2022-04-21: 500 mg via ORAL
  Filled 2022-04-20: qty 1

## 2022-04-20 MED ORDER — AMLODIPINE BESYLATE 5 MG PO TABS
5.0000 mg | ORAL_TABLET | Freq: Every day | ORAL | Status: DC
  Administered 2022-04-20 – 2022-04-21 (×2): 5 mg via ORAL
  Filled 2022-04-20 (×2): qty 1

## 2022-04-20 NOTE — Progress Notes (Signed)
Mobility Specialist Progress Note:   04/20/22 0947  Mobility  Activity Ambulated with assistance in hallway  Level of Assistance Standby assist, set-up cues, supervision of patient - no hands on  Assistive Device Front wheel walker  Distance Ambulated (ft) 460 ft  Activity Response Tolerated well  $Mobility charge 1 Mobility   Pt received in chair and eager. No complaints. Pt left sitting in chair with all needs met and call bell in reach.   Advaith Lamarque Mobility Specialist-Acute Rehab Secure Chat only

## 2022-04-20 NOTE — Progress Notes (Addendum)
[address]       [place] [address]             [phone]      3 Days Post-Op Procedure(s) (LRB): CORONARY ARTERY BYPASS GRAFTING (CABG) X TWO, USING LEFT INTERNAL MAMMARY ARTERY AND LEFT ARM RADIAL ARTERY (N/A) LEFT RADIAL ARTERY HARVEST (N/A) TRANSESOPHAGEAL ECHOCARDIOGRAM (TEE) (N/A) Subjective: Feels pretty well overall  Objective: Vital signs in last 24 hours: Temp:  [97.8 F (36.6 C)-99.2 F (37.3 C)] 98.5 F (36.9 C) (10/01 0445) Pulse Rate:  [78-97] 97 (09/30 2325) Cardiac Rhythm: Normal sinus rhythm (09/30 2050) Resp:  [19-33] 20 (10/01 0445) BP: (107-146)/(69-84) 107/70 (10/01 0445) SpO2:  [76 %-97 %] 94 % (10/01 0445) Weight:  [90.7 kg] 90.7 kg (10/01 0300)  Hemodynamic parameters for last 24 hours:    Intake/Output from previous day: 09/30 0701 - 10/01 0700 In: 550 [P.O.:540; I.V.:10] Out: 1655 [Urine:1625; Chest Tube:30] Intake/Output this shift: No intake/output data recorded.  General appearance: alert, cooperative, and no distress Heart: regular rate and rhythm Lungs: clear to auscultation bilaterally Abdomen: benign Extremities: minimal edema Wound: incis healing well, Left hand N/V intact  Lab Results: Recent Labs    04/19/22 0345 04/20/22 0252  WBC 15.2* 14.6*  HGB 10.5* 11.4*  HCT 31.4* 34.0*  PLT 161 189   BMET:  Recent Labs    04/19/22 0345 04/20/22 0252  NA 136 136  K 3.9 4.0  CL 103 106  CO2 24 22  GLUCOSE 153* 140*  BUN 10 13  CREATININE 0.96 0.92  CALCIUM 8.5* 8.4*    PT/INR:  Recent Labs    04/17/22 1253  LABPROT 15.1  INR 1.2   ABG    Component Value Date/Time   PHART 7.385 04/18/2022 0356   HCO3 22.7 04/18/2022 0356   TCO2 24 04/18/2022 0356   ACIDBASEDEF 2.0 04/18/2022 0356   O2SAT 96 04/18/2022 0356   CBG (last 3)  Recent Labs    04/19/22 1642 04/19/22 2058 04/20/22 0651  GLUCAP 147* 152* 151*    Meds Scheduled Meds:  acetaminophen  1,000 mg Oral Q6H   Or    acetaminophen (TYLENOL) oral liquid 160 mg/5 mL  1,000 mg Per Tube Q6H   amLODipine  2.5 mg Oral Daily   aspirin EC  81 mg Oral Daily   bisacodyl  10 mg Oral Daily   Or   bisacodyl  10 mg Rectal Daily   Chlorhexidine Gluconate Cloth  6 each Topical Daily   clopidogrel  75 mg Oral Daily   docusate sodium  200 mg Oral Daily   enoxaparin (LOVENOX) injection  40 mg Subcutaneous QHS   furosemide  40 mg Oral Daily   insulin aspart  0-15 Units Subcutaneous TID WC   insulin aspart  0-5 Units Subcutaneous QHS   metoprolol tartrate  12.5 mg Oral BID   Or   metoprolol tartrate  12.5 mg Per Tube BID   mupirocin ointment  1 Application Nasal BID   pantoprazole  40 mg Oral Daily   potassium chloride  10 mEq Oral Daily   rosuvastatin  40 mg Oral Daily   sodium chloride flush  3 mL Intravenous Q12H   Continuous Infusions:  sodium chloride     PRN Meds:.sodium chloride, alum & mag hydroxide-simeth, magnesium hydroxide, metoprolol tartrate, ondansetron (ZOFRAN) IV, mouth rinse, oxyCODONE, sodium chloride flush, traMADol  Xrays DG Chest Port 1 View  Result Date: 04/19/2022 CLINICAL DATA:  Pneumothorax.  Follow-up study. EXAM: PORTABLE CHEST 1 VIEW COMPARISON:  04/18/2022. FINDINGS: No pneumothorax. Lung base opacities, mostly on the left, are without change, consistent with atelectasis. Remainder of the lungs is clear. Right internal jugular Cordis and left inferior hemithorax chest tube are stable. No mediastinal widening. IMPRESSION: 1. No pneumothorax. 2. No acute findings. No current convincing interstitial pulmonary edema. 3. Left greater than right lung base opacities are stable consistent with atelectasis. Small effusions likely. Electronically Signed   By: Lajean Manes M.D.   On: 04/19/2022 08:34    Assessment/Plan: S/P Procedure(s) (LRB): CORONARY ARTERY BYPASS GRAFTING (CABG) X TWO, USING LEFT INTERNAL MAMMARY ARTERY AND LEFT ARM RADIAL ARTERY (N/A) LEFT RADIAL ARTERY HARVEST  (N/A) TRANSESOPHAGEAL ECHOCARDIOGRAM (TEE) (N/A)  POD#3  1 afeb, S BP 100's-140's, NSR, can increase norvasc for a little better BP control- On ASA/plavix/crestor and beta blocker 2 sats good on RA 3 good UOP , weight near preop, d/c lasix soon 4 normal renal fxn 5 leukocytosis trending down 6 Hgb a1c 7.5 , not on DM meds preop, CBG somewhat elevated Will ask DM coordinator to see, he is agreeable to start glucophage. Wife is very familiar with CBG technique and they have a monitor 7 expected ABLA trend improving 8 cont rehab and pulm hygiene- poss home in am if no new issues      LOS: 7 days    John Giovanni PA-C Pager [phone] 04/20/2022  Patient seen and examined, agree with above Doing well Possibly home tomorrow  Revonda Standard. Roxan Hockey, MD [facility] [phone]

## 2022-04-20 NOTE — Discharge Summary (Signed)
[address]       [place] [address]             [phone]    Physician Discharge Summary  Patient ID: Dustin Wood MRN: [id number] DOB/AGE: 20-Dec-1955 66 y.o.  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  He has no other significant past medical history or family history of coronary artery disease.  He is a non-smoker.  He developed some chest pain while hiking with his family on 04/13/2022.  This was associated with nausea and diaphoresis.   The pain radiated to the left arm and shoulder.  He presented to [facility].  Initial EKG was unremarkable showing no ischemic changes.  High-sensitivity troponin was elevated at 160 and later rose to peak of 600.  Dustin Wood was transferred to the [facility] with a diagnosis of acute non-ST elevation myocardial infarction.  He was treated with aspirin and started on heparin. Repeat EKG after admission did show ischemic changes.  An echocardiogram was performed showing ejection fraction of 50 to 55% with hypokinesis of the inferior wall.  There was discordant motion of the septum.  There were no structural or functional valvular abnormalities.  Dustin. Tamala Wood remained stable and underwent left heart catheterization yesterday afternoon demonstrating wall LAD stenosis with involvement at  the first diagonal the RCA and circumflex had no obstructive disease.   Dustin. Wood is resting in bed with his wife at the bedside.  He is in no distress and denies having any pain or shortness of breath. 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  He is right-handed.  Hospital course:  Following medical stabilization and full diagnostic evaluation the patient was felt to be acceptable to proceed with surgery and on 04/17/2022 he was taken the operating room at which time he underwent coronary artery bypass grafting x2.  A LIMA-LAD and left radial-diagonal were performed.  The patient tolerated procedure well was taken the surgical intensive care unit in stable condition.  Postoperative hospital course:  Patient is doing very well.  He maintained stable hemodynamics and was weaned from the ventilator without difficulty using standard protocols.  Renal function has remained normal.  He had a mild expected acute blood loss anemia which is stabilized with equilibration and diuresis.  He did have a postoperative reactive leukocytosis but this has trended lower over time and he evidences no signs of infection.  He was started on Norvasc for radial artery harvest as well as hypertension.  He was found this hospitalization to have type 2 diabetes and subsequently has undergone counseling by the diabetes coordinators and has additionally been started on Glucophage.  Oxygen was weaned and he maintains good saturations on room air.  Incisions are noted to be healing well and  left hand is neurovascularly intact.  He was tolerating routine activities using standard post cardiac rehabilitation phase 1 modalities.  He has maintained normal sinus rhythm.  Overall, at the time of discharge the patient is felt to be quite stable.  Consults: cardiology  Significant Diagnostic Studies:   DG Chest 2 View  Result Date: 04/20/2022 CLINICAL DATA:  Follow-up, post CABG. EXAM:  CHEST - 2 VIEW COMPARISON:  04/19/2022 and older exams. FINDINGS: All lines and tubes have been removed. No mediastinal widening. Left greater than right lung base opacities are noted consistent with atelectasis. Trace pleural effusions. Remainder of the lungs is clear. No pneumothorax. IMPRESSION: 1. No acute findings or evidence of an operative complication. 2. Left greater than right lung base opacities consistent with residual atelectasis, associated with trace pleural effusions. Electronically Signed   By: Lajean Manes M.D.   On: 04/20/2022 08:25   DG Chest Port 1 View  Result Date: 04/19/2022 CLINICAL DATA:  Pneumothorax.  Follow-up study. EXAM: PORTABLE CHEST 1 VIEW COMPARISON:  04/18/2022. FINDINGS: No pneumothorax. Lung base opacities, mostly on the left, are without change, consistent with atelectasis. Remainder of the lungs is clear. Right internal jugular Cordis and left inferior hemithorax chest tube are stable. No mediastinal widening. IMPRESSION: 1. No pneumothorax. 2. No acute findings. No current convincing interstitial pulmonary edema. 3. Left greater than right lung base opacities are stable consistent with atelectasis. Small effusions likely. Electronically Signed   By: Lajean Manes M.D.   On: 04/19/2022 08:34   ECHO INTRAOPERATIVE TEE  Result Date: 04/18/2022  *INTRAOPERATIVE TRANSESOPHAGEAL REPORT *  Patient Name:   Dustin Wood  Date of Exam: 04/17/2022 Medical Rec #:  [id number]      Height:       69.0 in Accession #:    [id number]     Weight:       192.4 lb Date of Birth:  1955-08-13      BSA:          2.03 m Patient Age:    39 years       BP:           117/79 mmHg Patient Gender: M              HR:           63 bpm. Exam Location:  Anesthesiology Transesophogeal exam was perform intraoperatively during surgical procedure. Patient was closely monitored under general anesthesia during the entirety of examination. Indications:     Coronary Artery Disease Sonographer:     Bernadene Person RDCS Performing Phys: [id number] Lucile Crater LIGHTFOOT Diagnosing Phys: Belinda Block MD POST-OP IMPRESSIONS _ Left Ventricle: Post Bypass: The patient was taken off bypass on the initial attempt. Left ventricular contraction did improve with volume replacement. There did not appear to be any new findings from preop exam. The TEE that had been placed after induction uneventfully was removed at the end of the case without difficulty. The patient was later taken to the SICU in stable condition. _ Right Ventricle: normal function. _ Aortic Valve: The gradient recorded across the prosthetic valve is within the expected range. PRE-OP FINDINGS  Left Ventricle: The left ventricle has low normal systolic function, with an ejection fraction of 50-55%. Right Ventricle: The right ventricle has normal systolic function. Left Atrium: No left atrial/left atrial appendage thrombus was detected. Mitral Valve: The mitral valve is normal in structure. Mitral valve regurgitation is trivial by color flow Doppler. Aortic Valve: The aortic  valve is tricuspid. Pulmonic Valve: The pulmonic valve was normal in structure.   Belinda Block MD Electronically signed by Belinda Block MD Signature Date/Time: 04/18/2022/5:02:14 PM   Final    DG Chest Port 1 View  Result Date: 04/18/2022 CLINICAL DATA:  Status post CABG.  Chest tube present. EXAM: PORTABLE CHEST 1 VIEW COMPARISON:  Chest radiograph 04/17/2022 FINDINGS: Sequelae of CABG are again identified. A right jugular sheath, mediastinal drain, and basilar left chest tube remain in place. Endotracheal and enteric tubes have been removed. The cardiac silhouette remains enlarged. Pulmonary vascular congestion and mild interstitial opacities have slightly increased. There is increased confluent airspace opacity in the left lung base and medial right lung base, and there is a persistent small left pleural effusion. No pneumothorax is identified. IMPRESSION: 1. Interval extubation. Other  support devices as above. No pneumothorax. 2. Slightly increased interstitial edema with persistent small left pleural effusion. Increased bibasilar atelectasis or consolidation. Electronically Signed   By: Logan Bores M.D.   On: 04/18/2022 08:55   DG Chest Port 1 View  Result Date: 04/17/2022 CLINICAL DATA:  Status post CABG EXAM: PORTABLE CHEST 1 VIEW COMPARISON:  Radiograph 04/13/2022 FINDINGS: Recent median sternotomy and postsurgical changes of CABG. Endotracheal tube tip overlies the midthoracic trachea. Right neck catheter sheath overlies the distal right IJ. Orogastric tube tip and side port overlie the stomach. There is a left basilar chest tube and a mediastinal drain in place. There are mild interstitial opacities. No evidence of pneumothorax. Trace left pleural effusion. IMPRESSION: Postoperative chest with mild interstitial edema and trace left pleural effusion. Lines and tubes as described above. Electronically Signed   By: Maurine Simmering M.D.   On: 04/17/2022 13:12   VAS US DOPPLER PRE CABG  Result Date: 04/15/2022 PREOPERATIVE VASCULAR EVALUATION Patient Name:  Dustin Wood  Date of Exam:   04/15/2022 Medical Rec #: [id number]      Accession #:    [id number] Date of Birth: 1956/01/30      Patient Gender: M Patient Age:   66 years Exam Location:  [facility] Procedure:      VAS US DOPPLER PRE CABG Referring Phys: Collier Salina VANTRIGT --------------------------------------------------------------------------------  Indications:      Pre-CABG. Risk Factors:     Diabetes, no history of smoking, prior MI. Comparison Study: No previous exams Performing Technologist: Hill, Jody RVT, RDMS  Examination Guidelines: A complete evaluation includes B-mode imaging, spectral Doppler, color Doppler, and power Doppler as needed of all accessible portions of each vessel. Bilateral testing is considered an integral part of a complete examination. Limited examinations for reoccurring indications may be performed  as noted.  Right Carotid Findings: +----------+--------+--------+--------+--------+--------+           PSV cm/sEDV cm/sStenosisDescribeComments +----------+--------+--------+--------+--------+--------+ CCA Prox  81      17                               +----------+--------+--------+--------+--------+--------+ CCA Distal78      21                               +----------+--------+--------+--------+--------+--------+ ICA Prox  65      19                               +----------+--------+--------+--------+--------+--------+ ICA Distal75  28                               +----------+--------+--------+--------+--------+--------+ ECA       96      13                               +----------+--------+--------+--------+--------+--------+ +----------+--------+-------+----------------+------------+           PSV cm/sEDV cmsDescribe        Arm Pressure +----------+--------+-------+----------------+------------+ Subclavian109            Multiphasic, WNL             +----------+--------+-------+----------------+------------+ +---------+--------+--+--------+-+---------+ VertebralPSV cm/s30EDV cm/s8Antegrade +---------+--------+--+--------+-+---------+ Left Carotid Findings: +----------+--------+--------+--------+---------------------+------------------+           PSV cm/sEDV cm/sStenosisDescribe             Comments           +----------+--------+--------+--------+---------------------+------------------+ CCA Prox  111     24                                                      +----------+--------+--------+--------+---------------------+------------------+ CCA Distal74      17                                   intimal thickening +----------+--------+--------+--------+---------------------+------------------+ ICA Prox  73      27      1-39%   hypoechoic and                                                            heterogenous                             +----------+--------+--------+--------+---------------------+------------------+ ICA Distal92      31                                                      +----------+--------+--------+--------+---------------------+------------------+ ECA       78      9                                                       +----------+--------+--------+--------+---------------------+------------------+ +----------+--------+--------+----------------+------------+ SubclavianPSV cm/sEDV cm/sDescribe        Arm Pressure +----------+--------+--------+----------------+------------+           141             Multiphasic, WNL             +----------+--------+--------+----------------+------------+ +---------+--------+--+--------+--+---------+ VertebralPSV cm/s45EDV cm/s11Antegrade +---------+--------+--+--------+--+---------+  ABI Findings: +---------+------------------+-----+---------+--------+ Right    Rt Pressure (mmHg)IndexWaveform Comment  +---------+------------------+-----+---------+--------+ Brachial 127  triphasic         +---------+------------------+-----+---------+--------+ PTA      151               1.19 triphasic         +---------+------------------+-----+---------+--------+ DP       141               1.11 triphasic         +---------+------------------+-----+---------+--------+ [facility]               0.97 Normal            +---------+------------------+-----+---------+--------+ +---------+------------------+-----+---------+-------+ Left     Lt Pressure (mmHg)IndexWaveform Comment +---------+------------------+-----+---------+-------+ Brachial 127                    triphasic        +---------+------------------+-----+---------+-------+ PTA      155               1.22 triphasic        +---------+------------------+-----+---------+-------+ DP       144               1.13 triphasic         +---------+------------------+-----+---------+-------+ Great Toe126               0.99 Normal           +---------+------------------+-----+---------+-------+ +-------+---------------+----------------+ ABI/TBIToday's ABI/TBIPrevious ABI/TBI +-------+---------------+----------------+ Right  1.19 / 0.97                     +-------+---------------+----------------+ Left   1.22 / 0.99                     +-------+---------------+----------------+  Right Doppler Findings: +--------+--------+-----+---------+--------+ Site    PressureIndexDoppler  Comments +--------+--------+-----+---------+--------+ [id number]          triphasic         +--------+--------+-----+---------+--------+ Radial               triphasic         +--------+--------+-----+---------+--------+ Ulnar                triphasic         +--------+--------+-----+---------+--------+  Left Doppler Findings: +--------+--------+-----+---------+--------+ Site    PressureIndexDoppler  Comments +--------+--------+-----+---------+--------+ [id number]          triphasic         +--------+--------+-----+---------+--------+ Radial               triphasic         +--------+--------+-----+---------+--------+ Ulnar                triphasic         +--------+--------+-----+---------+--------+   Summary: Right Carotid: The extracranial vessels were near-normal with only minimal wall                thickening or plaque. Left Carotid: Velocities in the left ICA are consistent with a 1-39% stenosis. Vertebrals:  Bilateral vertebral arteries demonstrate antegrade flow. Subclavians: Normal flow hemodynamics were seen in bilateral subclavian              arteries. Right ABI: Resting right ankle-brachial index is within normal range. The right toe-brachial index is normal. Left ABI: Resting left ankle-brachial index is within normal range. The left toe-brachial index is normal. Bilateral Extremity: Doppler  waveforms remain within normal limits with compression bilaterally for the radial arteries. Doppler waveforms remain within normal limits with compression bilaterally for the  ulnar arteries.  Electronically signed by Deitra Mayo MD on 04/15/2022 at 7:29:09 PM.    Final    CARDIAC CATHETERIZATION  Result Date: 04/14/2022 Images from the original result were not included.   Ost LAD to Prox LAD lesion is 95% stenosed.   1st Diag lesion is 95% stenosed.   The left ventricular systolic function is normal.   LV end diastolic pressure is normal.   The left ventricular ejection fraction is 50-55% by visual estimate. Dustin Wood is a 66 y.o. male  [id number] LOCATION:  FACILITY: [facility] PHYSICIAN: Quay Burow, M.D. 1955/11/02 DATE OF PROCEDURE:  04/14/2022 DATE OF DISCHARGE: CARDIAC CATHETERIZATION History obtained from chart review.66 y.o. male with PMH of psoriasis who presented with chest pain and found to have a NSTEMI. PROCEDURE DESCRIPTION: The patient was brought to the second floor [facility] cath lab in the postabsorptive state. He was premedicated with IV Versed and fentanyl. His right wrist was prepped and shaved in usual sterile fashion. Xylocaine 1% was used for local anesthesia. A 6 French sheath was inserted into the right radial artery using standard Seldinger technique. The patient received 4500 units  of heparin intravenously.  A 5 [place] TIG catheter and right Judkins catheters were used for selective coronary angiography and left ventriculography respectively.  Isovue dye was used for the entirety of the case (50 cc contrast total to patient).  Retrograde aortic, left ventricular and pullback pressures were recorded.  Radial cocktail was administered via the SideArm sheath.   Dustin. Scheffel has 90% thrombotic ostial/proximal segmental LAD stenosis involving the origin of a large first diagonal branch.  His RCA and circumflex are free of significant obstructive disease.  His LV function is  normal as is his LVEDP.  His anatomy is not suitable for percutaneous intervention.  He will need CABG.  I have reviewed his anatomy with Dr. Ellyn Hack who agrees.  We will will reheparinize him 2 hours after sheath removal.  He left the lab in stable condition. Quay Burow. MD, [facility] 04/14/2022 4:30 PM    ECHOCARDIOGRAM COMPLETE  Result Date: 04/14/2022    ECHOCARDIOGRAM REPORT   Patient Name:   Dustin Wood Date of Exam: 04/14/2022 Medical Rec #:  [id number]     Height:       69.0 in Accession #:    [id number]    Weight:       200.0 lb Date of Birth:  06-10-1956     BSA:          2.066 m Patient Age:    79 years      BP:           115/78 mmHg Patient Gender: M             HR:           63 bpm. Exam Location:  Inpatient Procedure: 2D Echo, Cardiac Doppler and Color Doppler Indications:   NSTEMI I21.4  History:       Patient has no prior history of Echocardiogram examinations.                NSTEMI.  Sonographer:   Ronny Flurry Referring      [phone] [place] Phys: IMPRESSIONS  1. Left ventricular ejection fraction, by estimation, is 50 to 55%. The left ventricle has low normal function. The left ventricle demonstrates regional wall motion abnormalities (see scoring diagram/findings for description). Left ventricular diastolic  parameters are consistent with Grade I diastolic dysfunction (  impaired relaxation). There is incoordinate septal motion. There is mild hypokinesis of the left ventricular, basal-mid inferior wall and inferolateral wall.  2. Right ventricular systolic function is low normal. The right ventricular size is normal.  3. The mitral valve is grossly normal. Trivial mitral valve regurgitation.  4. The aortic valve is tricuspid. Aortic valve regurgitation is not visualized. Aortic valve sclerosis/calcification is present, without any evidence of aortic stenosis. Comparison(s): No prior Echocardiogram. FINDINGS  Left Ventricle: Left ventricular ejection fraction, by estimation, is 50 to  55%. The left ventricle has low normal function. The left ventricle demonstrates regional wall motion abnormalities. Mild hypokinesis of the left ventricular, basal-mid inferior  wall and inferolateral wall. The left ventricular internal cavity size was normal in size. There is no left ventricular hypertrophy. Incoordinate septal motion. Left ventricular diastolic parameters are consistent with Grade I diastolic dysfunction (impaired relaxation). Normal left ventricular filling pressure. Right Ventricle: The right ventricular size is normal. No increase in right ventricular wall thickness. Right ventricular systolic function is low normal. Left Atrium: Left atrial size was normal in size. Right Atrium: Right atrial size was normal in size. Pericardium: There is no evidence of pericardial effusion. Mitral Valve: The mitral valve is grossly normal. Trivial mitral valve regurgitation. Tricuspid Valve: The tricuspid valve is grossly normal. Tricuspid valve regurgitation is trivial. Aortic Valve: The aortic valve is tricuspid. Aortic valve regurgitation is not visualized. Aortic valve sclerosis/calcification is present, without any evidence of aortic stenosis. Pulmonic Valve: The pulmonic valve was grossly normal. Pulmonic valve regurgitation is trivial. Aorta: The aortic root and ascending aorta are structurally normal, with no evidence of dilitation. Venous: The inferior vena cava was not well visualized. IAS/Shunts: No atrial level shunt detected by color flow Doppler.  LEFT VENTRICLE PLAX 2D LVIDd:         5.40 cm   Diastology LVIDs:         3.10 cm   LV e' medial:    5.87 cm/s LV PW:         0.90 cm   LV E/e' medial:  7.2 LV IVS:        0.70 cm   LV e' lateral:   8.70 cm/s LVOT diam:     2.10 cm   LV E/e' lateral: 4.9 LV SV:         53 LV SV Index:   26 LVOT Area:     3.46 cm  RIGHT VENTRICLE RV S prime:     10.70 cm/s TAPSE (M-mode): 1.6 cm LEFT ATRIUM             Index        RIGHT ATRIUM           Index LA diam:         3.30 cm 1.60 cm/m   RA Area:     10.90 cm LA Vol (A2C):   24.6 ml 11.91 ml/m  RA Volume:   21.50 ml  10.41 ml/m LA Vol (A4C):   30.9 ml 14.96 ml/m LA Biplane Vol: 30.6 ml 14.81 ml/m  AORTIC VALVE LVOT Vmax:   82.80 cm/s LVOT Vmean:  50.900 cm/s LVOT VTI:    0.153 m  AORTA Ao Root diam: 3.60 cm Ao Asc diam:  3.20 cm MITRAL VALVE MV Area (PHT): 3.48 cm    SHUNTS MV Decel Time: 218 msec    Systemic VTI:  0.15 m MV E velocity: 42.40 cm/s  Systemic Diam: 2.10 cm MV A velocity: 50.80  cm/s MV E/A ratio:  0.83 Lyman Bishop MD Electronically signed by Lyman Bishop MD Signature Date/Time: 04/14/2022/10:50:49 AM    Final    DG Chest 2 View  Result Date: 04/13/2022 CLINICAL DATA:  Chest pain. EXAM: CHEST - 2 VIEW COMPARISON:  None Available. FINDINGS: The heart size and mediastinal contours are within normal limits. Both lungs are clear. The visualized skeletal structures are unremarkable. IMPRESSION: No active cardiopulmonary disease. Electronically Signed   By: Dorise Bullion III M.D.   On: 04/13/2022 14:48      Results for orders placed or performed during the hospital encounter of 04/13/22 (from the past 72 hour(s))  Glucose, capillary     Status: Abnormal   Collection Time: 04/19/22  4:42 PM  Result Value Ref Range   Glucose-Capillary 147 (H) 70 - 99 mg/dL    Comment: Glucose reference range applies only to samples taken after fasting for at least 8 hours.   Comment 1 Notify RN    Comment 2 Document in Chart   Glucose, capillary     Status: Abnormal   Collection Time: 04/19/22  8:58 PM  Result Value Ref Range   Glucose-Capillary 152 (H) 70 - 99 mg/dL    Comment: Glucose reference range applies only to samples taken after fasting for at least 8 hours.  CBC     Status: Abnormal   Collection Time: 04/20/22  2:52 AM  Result Value Ref Range   WBC 14.6 (H) 4.0 - 10.5 K/uL   RBC 4.02 (L) 4.22 - 5.81 MIL/uL   Hemoglobin 11.4 (L) 13.0 - 17.0 g/dL   HCT 34.0 (L) 39.0 - 52.0 %   MCV 84.6  80.0 - 100.0 fL   MCH 28.4 26.0 - 34.0 pg   MCHC 33.5 30.0 - 36.0 g/dL   RDW 14.1 11.5 - 15.5 %   Platelets 189 150 - 400 K/uL   nRBC 0.0 0.0 - 0.2 %    Comment: Performed at [facility], [place] [address] [place], [place] [address]  Basic metabolic panel     Status: Abnormal   Collection Time: 04/20/22  2:52 AM  Result Value Ref Range   Sodium 136 135 - 145 mmol/L   Potassium 4.0 3.5 - 5.1 mmol/L   Chloride 106 98 - 111 mmol/L   CO2 22 22 - 32 mmol/L   Glucose, Bld 140 (H) 70 - 99 mg/dL    Comment: Glucose reference range applies only to samples taken after fasting for at least 8 hours.   BUN 13 8 - 23 mg/dL   Creatinine, Ser 0.92 0.61 - 1.24 mg/dL   Calcium 8.4 (L) 8.9 - 10.3 mg/dL   GFR, Estimated >60 >60 mL/min    Comment: (NOTE) Calculated using the CKD-EPI Creatinine Equation (2021)    Anion gap 8 5 - 15    Comment: Performed at [facility] [address] [place], [place] [address]  Glucose, capillary     Status: Abnormal   Collection Time: 04/20/22  6:51 AM  Result Value Ref Range   Glucose-Capillary 151 (H) 70 - 99 mg/dL    Comment: Glucose reference range applies only to samples taken after fasting for at least 8 hours.  Glucose, capillary     Status: Abnormal   Collection Time: 04/20/22  8:47 AM  Result Value Ref Range   Glucose-Capillary 143 (H) 70 - 99 mg/dL    Comment: Glucose reference range applies only to samples taken after fasting for  at least 8 hours.   Comment 1 Notify RN   Glucose, capillary     Status: Abnormal   Collection Time: 04/20/22  1:21 PM  Result Value Ref Range   Glucose-Capillary 142 (H) 70 - 99 mg/dL    Comment: Glucose reference range applies only to samples taken after fasting for at least 8 hours.   Comment 1 Notify RN   Glucose, capillary     Status: Abnormal   Collection Time: 04/20/22  4:13 PM  Result Value Ref Range   Glucose-Capillary 187 (H) 70 - 99 mg/dL    Comment: Glucose reference range applies only to samples  taken after fasting for at least 8 hours.   Comment 1 Notify RN   Glucose, capillary     Status: Abnormal   Collection Time: 04/20/22  7:35 PM  Result Value Ref Range   Glucose-Capillary 189 (H) 70 - 99 mg/dL    Comment: Glucose reference range applies only to samples taken after fasting for at least 8 hours.  Glucose, capillary     Status: Abnormal   Collection Time: 04/20/22  9:21 PM  Result Value Ref Range   Glucose-Capillary 158 (H) 70 - 99 mg/dL    Comment: Glucose reference range applies only to samples taken after fasting for at least 8 hours.  Glucose, capillary     Status: Abnormal   Collection Time: 04/21/22  6:21 AM  Result Value Ref Range   Glucose-Capillary 153 (H) 70 - 99 mg/dL    Comment: Glucose reference range applies only to samples taken after fasting for at least 8 hours.  Glucose, capillary     Status: Abnormal   Collection Time: 04/21/22  1:13 PM  Result Value Ref Range   Glucose-Capillary 120 (H) 70 - 99 mg/dL    Comment: Glucose reference range applies only to samples taken after fasting for at least 8 hours.    Treatments: surgery:  04/17/2022 Patient:  Dustin Wood Pre-Op Dx: NSTEMI Single vessel CAD      Post-op Dx:  same Procedure: CABG X 2.  LITA LAD, Left radial artery to Diagonal (Y graft of LITA)       Surgeon and Role:      * Lightfoot, Lucile Crater, MD - Primary    Evonnie Pat , PA-C - assisting     Discharge Exam: Blood pressure 112/79, pulse 97, temperature 98.1 F (36.7 C), temperature source Oral, resp. rate 20, height 5' 9"  (1.753 m), weight 89 kg, SpO2 96 %.  General appearance: alert, cooperative, and no distress Heart: regular rate and rhythm Lungs: clear to auscultation bilaterally Abdomen: benign Extremities: trace edema Wound: incia healing well  Discharge Medications:  The patient has been discharged on:   1.Beta Blocker:  Yes Blue.Reese   ]                              No   [   ]                              If No,  reason:  2.Ace Inhibitor/ARB: Yes Blue.Reese   ]                                     No  [    ]  If No, reason:  3.Statin:   Yes [ y  ]                  No  [   ]                  If No, reason:  4.Ecasa:  Yes  Blue.Reese   ]                  No   [   ]                  If No, reason:  Patient had ACS upon admission:  Plavix/P2Y12 inhibitor: Yes [ y  ]                                      No  [   ]     Discharge Instructions     Amb Referral to Cardiac Rehabilitation   Complete by: As directed    Diagnosis: CABG   CABG X ___: 2   After initial evaluation and assessments completed: Virtual Based Care may be provided alone or in conjunction with Phase 2 Cardiac Rehab based on patient barriers.: Yes   Intensive Cardiac Rehabilitation (ICR) [place] location only OR Traditional Cardiac Rehabilitation (TCR) *If criteria for ICR are not met will enroll in TCR [facility] only): Yes   Ambulatory referral to Nutrition and Diabetic Education   Complete by: As directed    Admit with NSTEMI--Also new diagnosis Diabetes this admit--A1c= 7.5%.  Needs CABG during admission so please wait to call pt until after he is discharged.  Thanks!   Discharge patient   Complete by: As directed    Discharge disposition: 01-Home or Self Care   Discharge patient date: 04/21/2022      Allergies as of 04/21/2022       Reactions   Penicillins Other (See Comments)   Childhood allergy        Medication List     STOP taking these medications    terbinafine 250 MG tablet Commonly known as: LamISIL       TAKE these medications    amLODipine 5 MG tablet Commonly known as: NORVASC Take 1 tablet (5 mg total) by mouth daily.   ascorbic acid 500 MG tablet Commonly known as: VITAMIN C Take 500 mg by mouth daily.   aspirin EC 81 MG tablet Take 1 tablet (81 mg total) by mouth daily. Swallow whole.   clopidogrel 75 MG tablet Commonly known as: PLAVIX Take 1 tablet (75 mg total) by  mouth daily.   fluorouracil 5 % cream Commonly known as: EFUDEX APPLY TO AFFECTED AREA(S) AT BEDTIME AS DIRECTED What changed: See the new instructions.   metFORMIN 500 MG tablet Commonly known as: GLUCOPHAGE Take 1 tablet (500 mg total) by mouth 2 (two) times daily with a meal.   metoprolol tartrate 25 MG tablet Commonly known as: LOPRESSOR Take 0.5 tablets (12.5 mg total) by mouth 2 (two) times daily.   multivitamin with minerals Tabs tablet Take 1 tablet by mouth daily.   omega-3 acid ethyl esters 1 g capsule Commonly known as: LOVAZA Take 1 g by mouth 2 (two) times daily.   oxyCODONE 5 MG immediate release tablet Commonly known as: Oxy IR/ROXICODONE Take 1 tablet (5 mg total) by mouth every 6 (six) hours as needed for up to 7  days for severe pain.   QC Tumeric Complex 500 MG Caps Generic drug: Turmeric Take 1 capsule by mouth daily.   rosuvastatin 40 MG tablet Commonly known as: CRESTOR Take 1 tablet (40 mg total) by mouth daily.   saw palmetto 500 MG capsule Take 500 mg by mouth in the morning, at noon, in the evening, and at bedtime.   Vitamin D3 50 MCG (2000 UT) Tabs Take 1 tablet by mouth daily.   zinc gluconate 50 MG tablet Take 50 mg by mouth daily.        Follow-up Information     Leonie Man, MD Follow up.   Specialty: Cardiology Why: Please see discharge paperwork for follow-up appointment with cardiology. Contact information: [address] [place] [address] [phone]         Lajuana Matte, MD Follow up.   Specialty: Cardiothoracic Surgery Why: Please see discharge paperwork for follow-up appointment with your surgeon.  Your first appointment will be a virtual appointment, do not come to the office.  Subsequent visits will be in person. Contact information: [address] [place] [address] [phone]                 Signed:  John Giovanni, PA-C  04/22/2022, 1:25 PM

## 2022-04-21 LAB — GLUCOSE, CAPILLARY
Glucose-Capillary: 153 mg/dL — ABNORMAL HIGH (ref 70–99)
Glucose-Capillary: 120 mg/dL — ABNORMAL HIGH (ref 70–99)

## 2022-04-21 MED ORDER — METOPROLOL TARTRATE 25 MG PO TABS: 12.5000 mg | ORAL_TABLET | Freq: Two times a day (BID) | ORAL | 1 refills | Status: DC

## 2022-04-21 MED ORDER — AMLODIPINE BESYLATE 5 MG PO TABS: 5.0000 mg | ORAL_TABLET | Freq: Every day | ORAL | 1 refills | Status: DC

## 2022-04-21 MED ORDER — ASPIRIN 81 MG PO TBEC: 81.0000 mg | DELAYED_RELEASE_TABLET | Freq: Every day | ORAL | 12 refills | Status: AC

## 2022-04-21 MED ORDER — CLOPIDOGREL BISULFATE 75 MG PO TABS: 75.0000 mg | ORAL_TABLET | Freq: Every day | ORAL | 1 refills | Status: DC

## 2022-04-21 MED ORDER — METFORMIN HCL 500 MG PO TABS: 500.0000 mg | ORAL_TABLET | Freq: Two times a day (BID) | ORAL | 1 refills | Status: DC

## 2022-04-21 MED ORDER — ROSUVASTATIN CALCIUM 40 MG PO TABS: 40.0000 mg | ORAL_TABLET | Freq: Every day | ORAL | 1 refills | Status: DC

## 2022-04-21 MED ORDER — OXYCODONE HCL 5 MG PO TABS: 5.0000 mg | ORAL_TABLET | Freq: Four times a day (QID) | ORAL | 0 refills | Status: AC | PRN

## 2022-04-21 NOTE — Plan of Care (Addendum)
Nutrition Education Note  RD consulted for nutrition education regarding diabetes.   66 year old male with PMHx of psoriasis, melanoma, who presented with chest pain, nausea, and diaphoresis on 9/24. Underwent LHC 9/15 semonstrating severe LAD with first diagonal CAD, not amenable to PCI. Now s/p CABG 04/17/22. New diagnosis of DM.  Met with patient and wife at bedside. Patient reports appetite is improving. He was previously eating 50% of meals and supplementing with snacks but he is now eating most of his meals. He denies any unintentional weight loss. Wt stable per chart. Patient and family have previous knowledge of carbohydrate counting and DM management from wife (gestational DM with pregnancies) and another family member.  Typical intake: Breakfast: cereal with milk or frozen waffle with peanut butter Lunch: meat with wrap Dinner: pizza or hamburger with fries Beverages: drinks sugar-free beverages  Lab Results  Component Value Date   HGBA1C 7.5 (H) 04/14/2022    RD provided "Carbohydrate Counting for People with Diabetes", "Label Reading Tips for Diabetes" and "Plate Method for Diabetes" handouts from the Academy of Nutrition and Dietetics. Discussed different food groups and their effects on blood sugar, emphasizing carbohydrate-containing foods. Provided list of carbohydrates and recommended serving sizes of common foods.  Discussed importance of controlled and consistent carbohydrate intake throughout the day. Provided examples of ways to balance meals/snacks and encouraged intake of high-fiber, whole grain complex carbohydrates. Teach back method used.  Expect good compliance.  Current diet order is carbohydrate modified, patient is consuming approximately 50-100% of meals at this time. Labs and medications reviewed. No further nutrition interventions warranted at this time. RD contact information provided. If additional nutrition issues arise, please re-consult RD.  Loanne Drilling, MS, RD, LDN, CNSC Pager number available on Amion

## 2022-04-21 NOTE — Progress Notes (Signed)
CARDIAC REHAB PHASE I   PRE:  Rate/Rhythm: 82 SR  BP:  Sitting: 112/79      SaO2: 96 RA   MODE:  Ambulation: 470 ft   POST:  Rate/Rhythm: 98 SR   BP:  Sitting:138/83      SaO2: 98 RA   Pt ambulated in hall independently with no c/o of sob, pain or dizziness. Back to room to chair with call bell and bedside table in reach. Post [facility] education including sternal precautions, move in the tub, heart healthy diabetic diet, IS use at home, home needs at discharge, restrictions, risk factors, site care, exercise guidelines and CRP2 reviewed with pt and wife. All questions and concerns addressed. Will refer to [facility] for CRP2. Will continue to follow.   [id number]  Vanessa Barbara, RN BSN 04/21/2022 10:37 AM

## 2022-04-21 NOTE — Progress Notes (Signed)
Mobility Specialist Progress Note:   04/21/22 0902  Mobility  Activity Ambulated with assistance in hallway  Level of Assistance Modified independent, requires aide device or extra time  Assistive Device None  Distance Ambulated (ft) 550 ft  Activity Response Tolerated well  $Mobility charge 1 Mobility   Pre- Mobility:  98 HR During Mobility: 108 HR Post Mobility:   96 HR  Pt received in chair willing to participate in mobility. No complaints of pain. Left in chair with call bell in reach and all needs met.   [facility] Surveyor, mining Chat only

## 2022-04-21 NOTE — Progress Notes (Signed)
[address]       [place] [address]             [phone]      4 Days Post-Op Procedure(s) (LRB): CORONARY ARTERY BYPASS GRAFTING (CABG) X TWO, USING LEFT INTERNAL MAMMARY ARTERY AND LEFT ARM RADIAL ARTERY (N/A) LEFT RADIAL ARTERY HARVEST (N/A) TRANSESOPHAGEAL ECHOCARDIOGRAM (TEE) (N/A) Subjective: Feels well  Objective: Vital signs in last 24 hours: Temp:  [98 F (36.7 C)-98.7 F (37.1 C)] 98 F (36.7 C) (10/02 0340) Pulse Rate:  [76-106] 76 (10/02 0340) Cardiac Rhythm: Normal sinus rhythm (10/01 1946) Resp:  [20-21] 20 (10/02 0340) BP: (114-135)/(72-79) 114/72 (10/02 0340) SpO2:  [93 %-97 %] 97 % (10/02 0340) Weight:  [89 kg] 89 kg (10/02 0212)  Hemodynamic parameters for last 24 hours:    Intake/Output from previous day: 10/01 0701 - 10/02 0700 In: 120 [P.O.:120] Out: -  Intake/Output this shift: No intake/output data recorded.  General appearance: alert, cooperative, and no distress Heart: regular rate and rhythm Lungs: clear to auscultation bilaterally Abdomen: benign Extremities: trace edema Wound: incia healing well  Lab Results: Recent Labs    04/19/22 0345 04/20/22 0252  WBC 15.2* 14.6*  HGB 10.5* 11.4*  HCT 31.4* 34.0*  PLT 161 189   BMET:  Recent Labs    04/19/22 0345 04/20/22 0252  NA 136 136  K 3.9 4.0  CL 103 106  CO2 24 22  GLUCOSE 153* 140*  BUN 10 13  CREATININE 0.96 0.92  CALCIUM 8.5* 8.4*    PT/INR: No results for input(s): "LABPROT", "INR" in the last 72 hours. ABG    Component Value Date/Time   PHART 7.385 04/18/2022 0356   HCO3 22.7 04/18/2022 0356   TCO2 24 04/18/2022 0356   ACIDBASEDEF 2.0 04/18/2022 0356   O2SAT 96 04/18/2022 0356   CBG (last 3)  Recent Labs    04/20/22 1935 04/20/22 2121 04/21/22 0621  GLUCAP 189* 158* 153*    Meds Scheduled Meds:  acetaminophen  1,000 mg Oral Q6H   Or   acetaminophen (TYLENOL) oral liquid 160 mg/5 mL  1,000 mg Per Tube Q6H   amLODipine  5  mg Oral Daily   aspirin EC  81 mg Oral Daily   bisacodyl  10 mg Oral Daily   Or   bisacodyl  10 mg Rectal Daily   Chlorhexidine Gluconate Cloth  6 each Topical Daily   clopidogrel  75 mg Oral Daily   docusate sodium  200 mg Oral Daily   enoxaparin (LOVENOX) injection  40 mg Subcutaneous QHS   furosemide  40 mg Oral Daily   insulin aspart  0-15 Units Subcutaneous TID WC   insulin aspart  0-5 Units Subcutaneous QHS   metFORMIN  500 mg Oral BID WC   metoprolol tartrate  12.5 mg Oral BID   Or   metoprolol tartrate  12.5 mg Per Tube BID   pantoprazole  40 mg Oral Daily   potassium chloride  10 mEq Oral Daily   rosuvastatin  40 mg Oral Daily   sodium chloride flush  3 mL Intravenous Q12H   Continuous Infusions:  sodium chloride     PRN Meds:.sodium chloride, alum & mag hydroxide-simeth, magnesium hydroxide, metoprolol tartrate, ondansetron (ZOFRAN) IV, mouth rinse, oxyCODONE, sodium chloride flush, traMADol  Xrays DG Chest 2 View  Result Date: 04/20/2022 CLINICAL DATA:  Follow-up, post CABG. EXAM: CHEST - 2 VIEW COMPARISON:  04/19/2022 and older exams. FINDINGS: All  lines and tubes have been removed. No mediastinal widening. Left greater than right lung base opacities are noted consistent with atelectasis. Trace pleural effusions. Remainder of the lungs is clear. No pneumothorax. IMPRESSION: 1. No acute findings or evidence of an operative complication. 2. Left greater than right lung base opacities consistent with residual atelectasis, associated with trace pleural effusions. Electronically Signed   By: Lajean Manes M.D.   On: 04/20/2022 08:25    Assessment/Plan: S/P Procedure(s) (LRB): CORONARY ARTERY BYPASS GRAFTING (CABG) X TWO, USING LEFT INTERNAL MAMMARY ARTERY AND LEFT ARM RADIAL ARTERY (N/A) LEFT RADIAL ARTERY HARVEST (N/A) TRANSESOPHAGEAL ECHOCARDIOGRAM (TEE) (N/A) POD#4  1 afeb, VSS, sinus rhythm, sinus tachy, low 100's 2 sats good on RA 3 voiding and having BM's 4 no  new labs or Xray's 5 BS ok, now on glucophage 6 ambulating with walker without problem 7 stable for d/c , will have cardiac rehab do their teaching before he leaves    LOS: 8 days    John Giovanni PA-C Pager [phone] 04/21/2022

## 2022-04-21 NOTE — Progress Notes (Signed)
Pt ambulated in hall with front wheel walker with steady gate, pt tolerated well

## 2022-04-21 NOTE — TOC Transition Note (Signed)
Transition of Care (TOC) - CM/SW Discharge Note Marvetta Gibbons RN, BSN Transitions of Care Unit 4E- RN Case Manager See Treatment Team for direct phone #    Patient Details  Name: Dustin Wood MRN: [id number] Date of Birth: 1955/11/08  Transition of Care [facility]) CM/SW Contact:  Dawayne Patricia, RN Phone Number: 04/21/2022, 12:14 PM   Clinical Narrative:    Pt stable for transition home today, 02 has been weaned off, pt now on RA. Transition of Care Department [facility]) has reviewed patient and no TOC needs have been identified at this time.  Pt to return home with spouse.   Final next level of care: Home/Self Care Barriers to Discharge: Barriers Resolved   Patient Goals and CMS Choice Patient states their goals for this hospitalization and ongoing recovery are:: to return home.   Choice offered to / list presented to : NA  Discharge Placement               Home        Discharge Plan and Services In-house Referral: NA Discharge Planning Services: CM Consult            DME Arranged: N/A DME Agency: NA       HH Arranged: NA HH Agency: NA        Social Determinants of Health (SDOH) Interventions     Readmission Risk Interventions    04/21/2022   12:14 PM  Readmission Risk Prevention Plan  Transportation Screening Complete  PCP or Specialist Appt within 5-7 Days Complete  Home Care Screening Complete  Medication Review (RN CM) Complete

## 2022-04-21 NOTE — Progress Notes (Signed)
Patient given discharge instructions. PIVs removed. Telemetry box removed, CCMD notified. Patient taken to vehicle in wheelchair by staff.  Akshitha Culmer L Hoyte Ziebell, RN    

## 2022-04-22 ENCOUNTER — Ambulatory Visit: Payer: BC Managed Care – PPO | Admitting: Family Medicine

## 2022-04-22 VITALS — BP 132/68 | HR 83 | Ht 69.0 in | Wt 195.2 lb

## 2022-04-22 DIAGNOSIS — Z951 Presence of aortocoronary bypass graft: Secondary | ICD-10-CM

## 2022-04-22 DIAGNOSIS — I214 Non-ST elevation (NSTEMI) myocardial infarction: Secondary | ICD-10-CM | POA: Diagnosis not present

## 2022-04-22 DIAGNOSIS — E119 Type 2 diabetes mellitus without complications: Secondary | ICD-10-CM

## 2022-04-22 MED ORDER — BLOOD GLUCOSE MONITOR KIT: PACK | 0 refills | Status: AC

## 2022-04-22 MED ORDER — EMPAGLIFLOZIN 25 MG PO TABS: 25.0000 mg | ORAL_TABLET | Freq: Every day | ORAL | 3 refills | Status: DC

## 2022-04-22 NOTE — Progress Notes (Signed)
Subjective:    Patient ID: Dustin Wood, male    DOB: [birth year], 66 y.o.   MRN: [id number]  HPI Patient was recently admitted to the hospital with a non-ST elevation myocardial infarction and underwent CABG.  He was also diagnosed with diabetes at that time with hemoglobin A1c of 7.5.  He was discharged home on Plavix and Crestor 40 mg a day along with metformin.  He is not taking the Crestor because the pharmacist told him there was a higher risk of myopathy with a combination of Crestor and Plavix.  He is taking the metformin but he is not checking his sugars regularly. Past Medical History:  Diagnosis Date   Melanoma ([place]) 04/07/2019   right fore arm MIS TX EXC   SCC (squamous cell carcinoma) 06/30/2016   RIGHT DORSAL HAND CX3 5FU   SCC (squamous cell carcinoma) 06/30/2016   LEFT HAND TX CX3 5FU   Squamous cell carcinoma of skin 12/2015   RIGHT TEMPLE CX3 5FU    Denies surgeries  Allergies  Allergen Reactions   Penicillins Other (See Comments)    Childhood allergy   Social History   Socioeconomic History   Marital status: Single    Spouse name: Not on file   Number of children: Not on file   Years of education: Not on file   Highest education level: Not on file  Occupational History   Not on file  Tobacco Use   Smoking status: Never   Smokeless tobacco: Never  Vaping Use   Vaping Use: Never used  Substance and Sexual Activity   Alcohol use: Yes    Comment: rare   Drug use: No   Sexual activity: Not on file  Other Topics Concern   Not on file  Social History Narrative   Not on file   Social Determinants of Health   Financial Resource Strain: Not on file  Food Insecurity: No Food Insecurity (04/14/2022)   Hunger Vital Sign    Worried About Running Out of Food in the Last Year: Never true    Ran Out of Food in the Last Year: Never true  Transportation Needs: No Transportation Needs (04/14/2022)   PRAPARE - Hydrologist  (Medical): No    Lack of Transportation (Non-Medical): No  Physical Activity: Not on file  Stress: Not on file  Social Connections: Not on file  Intimate Partner Violence: Not At Risk (04/14/2022)   Humiliation, Afraid, Rape, and Kick questionnaire    Fear of Current or Ex-Partner: No    Emotionally Abused: No    Physically Abused: No    Sexually Abused: No      Review of Systems  All other systems reviewed and are negative.      Objective:   Physical Exam Vitals reviewed.  Constitutional:      General: He is not in acute distress.    Appearance: He is well-developed. He is not diaphoretic.  HENT:     Head: Normocephalic and atraumatic.  Neck:     Thyroid: No thyromegaly.     Vascular: No JVD.     Trachea: No tracheal deviation.  Cardiovascular:     Rate and Rhythm: Normal rate and regular rhythm.     Heart sounds: Normal heart sounds. No murmur heard.    No friction rub. No gallop.  Pulmonary:     Effort: Pulmonary effort is normal. No respiratory distress.     Breath sounds: Normal breath sounds.  No stridor. No wheezing or rales.  Chest:     Chest wall: No tenderness.  Musculoskeletal:        General: Deformity present.  Skin:    General: Skin is warm.     Coloration: Skin is not pale.     Findings: Lesion present. No erythema or rash.  Neurological:     Mental Status: He is alert.     Motor: No abnormal muscle tone.     Deep Tendon Reflexes: Reflexes are normal and symmetric.  Psychiatric:        Behavior: Behavior normal.        Thought Content: Thought content normal.        Judgment: Judgment normal.           Assessment & Plan:  New onset type 2 diabetes mellitus (HCC)  S/P CABG x 2  NSTEMI (non-ST elevated myocardial infarction) ([place]) Recommended discontinuation of metformin and switching to Jardiance 25 mg daily for the added cardioprotective benefit.  Recommended taking a statin due to his history of cardiovascular disease.  Patient is  willing to compromise and take 20 mg of Crestor to reduce his risk of myopathy.  Recheck fasting lab work in 3 months.

## 2022-04-24 ENCOUNTER — Encounter: Payer: Self-pay | Admitting: Cardiology

## 2022-04-24 NOTE — Telephone Encounter (Signed)
So I did not do his discharge, but I see that the worked on getting prior authorization for [place].  That is a medicine to help treat high triglyceride levels as part of the lipid panel.  This was actually written as part of the discharge meds from the hospital after his bypass surgery.   Glenetta Hew, MD

## 2022-04-25 ENCOUNTER — Ambulatory Visit (INDEPENDENT_AMBULATORY_CARE_PROVIDER_SITE_OTHER): Payer: Self-pay | Admitting: Thoracic Surgery (Cardiothoracic Vascular Surgery)

## 2022-04-25 DIAGNOSIS — Z951 Presence of aortocoronary bypass graft: Secondary | ICD-10-CM

## 2022-04-25 NOTE — Progress Notes (Signed)
     [address]       [place] [address]             [phone]       Patient: Home Provider: Office Consent for Telemedicine visit obtained.  Today's visit was completed via a real-time telehealth (see specific modality noted below). The patient/authorized person provided oral consent at the time of the visit to engage in a telemedicine encounter with the present provider at [facility]. The patient/authorized person was informed of the potential benefits, limitations, and risks of telemedicine. The patient/authorized person expressed understanding that the laws that protect confidentiality also apply to telemedicine. The patient/authorized person acknowledged understanding that telemedicine does not provide emergency services and that he or she would need to call 911 or proceed to the nearest hospital for help if such a need arose.   Total time spent in the clinical discussion 10 minutes.  Telehealth Modality: Phone visit (audio only)  I had a telephone visit with  Dustin Wood who is s/p CABG.  Overall doing well.   Pain is minimal.  Ambulating well. Vitals have been stable.  [name] will see [place] back in 1 month with a chest x-ray for cardiac rehab clearance.  [name]

## 2022-05-02 NOTE — Progress Notes (Signed)
error 

## 2022-05-04 NOTE — Progress Notes (Unsigned)
Cardiology Clinic Note   Patient Name: Dustin Wood Date of Encounter: 05/05/2022  Primary Care Provider:  Susy Frizzle, MD Primary Cardiologist:  Glenetta Hew, MD  Patient Profile    Mr. Bitter is a 66 year-old male who presents to the clinic today for hospital follow-up status post CABG.  Past Medical History    Past Medical History:  Diagnosis Date   Melanoma ([place]) 04/07/2019   right fore arm MIS TX EXC   SCC (squamous cell carcinoma) 06/30/2016   RIGHT DORSAL HAND CX3 5FU   SCC (squamous cell carcinoma) 06/30/2016   LEFT HAND TX CX3 5FU   Squamous cell carcinoma of skin 12/2015   RIGHT TEMPLE CX3 5FU   Past Surgical History:  Procedure Laterality Date   CHOLECYSTECTOMY     CORONARY ARTERY BYPASS GRAFT N/A 04/17/2022   Procedure: CORONARY ARTERY BYPASS GRAFTING (CABG) X TWO, USING LEFT INTERNAL MAMMARY ARTERY AND LEFT ARM RADIAL ARTERY;  Surgeon: Lajuana Matte, MD;  Location: [place];  Service: Open Heart Surgery;  Laterality: N/A;   HERNIA REPAIR     LEFT HEART CATH AND CORONARY ANGIOGRAPHY N/A 04/14/2022   Procedure: LEFT HEART CATH AND CORONARY ANGIOGRAPHY;  Surgeon: Lorretta Harp, MD;  Location: [place] [facility];  Service: Cardiovascular;  Laterality: N/A;   MELANOMA EXCISION     RADIAL ARTERY HARVEST N/A 04/17/2022   Procedure: LEFT RADIAL ARTERY HARVEST;  Surgeon: Lajuana Matte, MD;  Location: [place];  Service: Open Heart Surgery;  Laterality: N/A;   TEE WITHOUT CARDIOVERSION N/A 04/17/2022   Procedure: TRANSESOPHAGEAL ECHOCARDIOGRAM (TEE);  Surgeon: Lajuana Matte, MD;  Location: [place];  Service: Open Heart Surgery;  Laterality: N/A;    Allergies  Allergies  Allergen Reactions   Penicillins Other (See Comments)    Childhood allergy    History of Present Illness    Mr. Lohr has a past medical history of T2DM and most recently NSTEMI status post CABG.  Patient was seen in the emergency room on 04/13/2022 with complaints of left  sided chest pain radiating to his left arm and between shoulder blades that began while hiking.  At that time he reported he noticed some mild chest pressure while working in his barn the evening before which resolved with decreased activity. Serial troponins were 160, 286, 608, 612. Lipid panel on 04/14/2022 showed LDL 77, HDL 26, triglycerides 349.  Echo on 04/14/2022 showed an EF of 50 to 55%.  Left ventricular function was low normal regional wall motion abnormalities.  Grade 1 DD.  Ventricular systolic function low normal.  Trivial mitral valve regurgitation.  Aortic valve sclerosis calcification present without any stenosis.  Left heart catheterization on 04/14/2022 with Dr. Gwenlyn Found showed steel LAD to proximal LAD lesion 95% stenosed and first diagonal lesion 95% stenosed.  And circumflex were free from significant obstructive disease.  It was found his anatomy was not suitable for PCI and he was referred for CABG.  Pre-CABG ultrasound of carotids and extremities 04/15/2022 showed the following:  Right Carotid: The extracranial vessels were near-normal with only minimal wall thickening or plaque.  Left Carotid: Velocities in the left ICA are consistent with a 1-39%  stenosis.  Vertebrals:  Bilateral vertebral arteries demonstrate antegrade flow.  Subclavians: Normal flow hemodynamics were seen in bilateral subclavian arteries.  Patient underwent CABG on 04/17/2022.  LIMA to LAD and left radial artery to diagonal.  Today, patient is accompanied by his wife.  He reports  he is doing well.  He denies palpitations, chest pain, shortness of breath at rest or with exertion, or lower extremity edema.  He is tolerating his medications with no complaints.  He is slowly increasing his walking and is managing 20 to 30 minutes/day which he breaks up into twice a day walks.  His energy level is slowly returning to normal.  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  He follows up with CT surgery on  05/27/2022.  His left radial artery harvest incision site is well approximated without drainage, erythema, or warmth.  He denies any pain in that area but reports some continued mild numbness.  His right wrist at the site of his left heart catheterization is healed with no ecchymosis and no reports of pain in wrist, forearm or elbow.  His chest incision is well approximated without drainage, surrounding erythema or warmth.  He reports he continues to follow sternal precautions.  Reviewed lipid panel from 04/14/2022 with patient.  LDL 77, HDL 26, triglycerides 349.  He reports his insurance approved Lovaza but he does not have a prescription.  We will get that called in today.  He is currently following a diabetic/cardiac diet.   Home Medications    Amlodipine 5 mg daily Aspirin EC 81 mg daily Clopidogrel 75 mg daily Empagliflozin 25 mg daily before breakfast Metoprolol Tartrate 25 mg 0.5 tablet daily Rosuvastatin 40 mg daily  Family History    No family history on file. has no family status information on file.    Social History    Social History   Socioeconomic History   Marital status: Married    Spouse name: Not on file   Number of children: Not on file   Years of education: Not on file   Highest education level: Not on file  Occupational History   Not on file  Tobacco Use   Smoking status: Never   Smokeless tobacco: Never  Vaping Use   Vaping Use: Never used  Substance and Sexual Activity   Alcohol use: Yes    Comment: rare   Drug use: No   Sexual activity: Not on file  Other Topics Concern   Not on file  Social History Narrative   Not on file   Social Determinants of Health   Financial Resource Strain: Not on file  Food Insecurity: No Food Insecurity (04/14/2022)   Hunger Vital Sign    Worried About Running Out of Food in the Last Year: Never true    Ran Out of Food in the Last Year: Never true  Transportation Needs: No Transportation Needs (04/14/2022)    PRAPARE - Hydrologist (Medical): No    Lack of Transportation (Non-Medical): No  Physical Activity: Not on file  Stress: Not on file  Social Connections: Not on file  Intimate Partner Violence: Not At Risk (04/14/2022)   Humiliation, Afraid, Rape, and Kick questionnaire    Fear of Current or Ex-Partner: No    Emotionally Abused: No    Physically Abused: No    Sexually Abused: No     Review of Systems    General:  No chills, fever, night sweats or weight changes.  Cardiovascular:  No chest pain, dyspnea on exertion, edema, orthopnea, palpitations, paroxysmal nocturnal dyspnea. Dermatological: No rash, lesions/masses Respiratory: No cough, dyspnea Urologic: No hematuria, dysuria Abdominal:   No nausea, vomiting, diarrhea, bright red blood per rectum, melena, or hematemesis Neurologic:  No visual changes,  weakness, changes in mental status. All other systems reviewed and are otherwise negative except as noted above.  Physical Exam    VS:  BP 128/66 (BP Location: Left Arm, Patient Position: Sitting, Cuff Size: Normal)   Pulse 77   Ht '5\' 9"'$  (1.753 m)   Wt 189 lb (85.7 kg)   SpO2 97%   BMI 27.91 kg/m  , BMI Body mass index is 27.91 kg/m. GEN: Well nourished, well developed, in no acute distress. HEENT: normal. Neck: Supple, no JVD, carotid bruits, or masses. Cardiac: RRR, no murmurs, rubs, or gallops. No clubbing, cyanosis, edema.  Radials/DP/PT 2+ and equal bilaterally.  Respiratory:  Respirations regular and unlabored, clear to auscultation bilaterally. GI: Soft, nontender, nondistended MS: no deformity or atrophy. Skin: warm and dry, no rash.Left forearm incision well approximated without drainage, ecchymosis, erythema, or warmth. Midline chest incision well approximated without drainage, ecchymosis, erythema, or warmth. Neuro:  Strength and sensation are intact. Psych: Normal affect.  Accessory Clinical Findings    The following studies were  reviewed for this visit: Echo 04/14/2022:  IMPRESSIONS    1. Left ventricular ejection fraction, by estimation, is 50 to 55%. The  left ventricle has low normal function. The left ventricle demonstrates  regional wall motion abnormalities (see scoring diagram/findings for description). Left ventricular diastolic   parameters are consistent with Grade I diastolic dysfunction (impaired  relaxation). There is incoordinate septal motion. There is mild  hypokinesis of the left ventricular, basal-mid inferior wall and  inferolateral wall.   2. Right ventricular systolic function is low normal. The right  ventricular size is normal.   3. The mitral valve is grossly normal. Trivial mitral valve  regurgitation.   4. The aortic valve is tricuspid. Aortic valve regurgitation is not  visualized. Aortic valve sclerosis/calcification is present, without any evidence of aortic stenosis.   Comparison(s): No prior Echocardiogram.  Left Heart Catheterization 04/14/2022:  Conclusion    Ost LAD to Prox LAD lesion is 95% stenosed.   1st Diag lesion is 95% stenosed.   The left ventricular systolic function is normal.   LV end diastolic pressure is normal.   The left ventricular ejection fraction is 50-55% by visual estimate.  Pre-CABG VAS US Carotids/Arms/Legs 04/15/22: Summary:  Right Carotid: The extracranial vessels were near-normal with only minimal wall thickening or plaque.  Left Carotid: Velocities in the left ICA are consistent with a 1-39%  stenosis.  Vertebrals:  Bilateral vertebral arteries demonstrate antegrade flow.  Subclavians: Normal flow hemodynamics were seen in bilateral subclavian arteries.   Right ABI: Resting right ankle-brachial index is within normal range. The right toe-brachial index is normal.  Left ABI: Resting left ankle-brachial index is within normal range. The left toe-brachial index is normal.  Bilateral Extremity: Doppler waveforms remain within normal limits with  compression bilaterally for the radial arteries. Doppler waveforms remain within normal limits with compression bilaterally for the ulnar arteries.   Recent Labs: 04/14/2022: ALT 30 04/16/2022: TSH 3.869 04/18/2022: Magnesium 2.4 04/20/2022: BUN 13; Creatinine, Ser 0.92; Hemoglobin 11.4; Platelets 189; Potassium 4.0; Sodium 136   Recent Lipid Panel    Component Value Date/Time   CHOL 173 04/14/2022 0220   TRIG 349 (H) 04/14/2022 0220   HDL 26 (L) 04/14/2022 0220   CHOLHDL 6.7 04/14/2022 0220   VLDL 70 (H) 04/14/2022 0220   LDLCALC 77 04/14/2022 0220   LDLCALC 95 06/22/2020 0811         ECG personally reviewed by me today-normal sinus rhythm heart  rate 77 nonspecific T wave abnormality-  Unchanged from 04/18/2022    Assessment & Plan   CAD s/p CABG x 2.  Patient is doing well with no complaints today.  His left forearm incision from radial artery harvest is healing well with no drainage, erythema, or warmth.  His line chest incision is also healing well with no drainage, erythema, or warmth.  He is having no pain at either of the sites.  His energy level is slowly increasing back to normal.  He is walking a total of 20 to 30 minutes which he breaks up into 2 walks a day.  He he denies chest pain, shortness of breath, DOE. Dyslipidemia.  Lipid panel from 04/14/2022 showed LDL of 77 and HDL of 26.  He will continue Crestor 40 mg daily.  His PCP will check fasting labs in 3 months. Hypertriglyceridemia.  Triglycerides from 04/14/2022 were 349.  His insurance approved Lovaza but he did not have a prescription for that from the hospital.  We will get that called in for him today. T2DM.  This is a new diagnosis for him.  Hemoglobin A1c on 04/14/2022 was 7.5.  His PCP recommended stopping metformin and starting Jardiance.  He is tolerating that well.   Disposition: Patient will continue current medications.  Follow-up in 3 months or sooner as needed.   Justice Britain. Jenae Tomasello, NP-C      05/05/2022, 7:11 PM [facility] [address] Office [phone] Fax [phone]  Notice: This dictation was prepared with Dragon dictation along with smaller phrase technology. Any transcriptional errors that result from this process are unintentional and may not be corrected upon review.  I spent 17 minutes examining this patient, reviewing medications, and using patient centered shared decision making involving her cardiac care.  Prior to her visit I spent greater than 20 minutes reviewing her past medical history,  medications, and prior cardiac tests.

## 2022-05-05 ENCOUNTER — Ambulatory Visit: Payer: BC Managed Care – PPO | Attending: General Practice | Admitting: Student

## 2022-05-05 ENCOUNTER — Encounter: Payer: Self-pay | Admitting: General Practice

## 2022-05-05 VITALS — BP 128/66 | HR 77 | Ht 69.0 in | Wt 189.0 lb

## 2022-05-05 DIAGNOSIS — E119 Type 2 diabetes mellitus without complications: Secondary | ICD-10-CM

## 2022-05-05 DIAGNOSIS — E781 Pure hyperglyceridemia: Secondary | ICD-10-CM | POA: Diagnosis not present

## 2022-05-05 DIAGNOSIS — I2510 Atherosclerotic heart disease of native coronary artery without angina pectoris: Secondary | ICD-10-CM | POA: Diagnosis not present

## 2022-05-05 DIAGNOSIS — Z951 Presence of aortocoronary bypass graft: Secondary | ICD-10-CM | POA: Diagnosis not present

## 2022-05-05 DIAGNOSIS — E785 Hyperlipidemia, unspecified: Secondary | ICD-10-CM | POA: Diagnosis not present

## 2022-05-05 MED ORDER — OMEGA-3-ACID ETHYL ESTERS 1 G PO CAPS: 1.0000 g | ORAL_CAPSULE | Freq: Two times a day (BID) | ORAL | 4 refills | Status: DC

## 2022-05-05 NOTE — Patient Instructions (Signed)
Medication Instructions:  START OMEGA 3 (LOVAZA) 1GRAM TWICE DAILY *If you need a refill on your cardiac medications before your next appointment, please call your pharmacy*  Lab Work: NONE  Testing/Procedures: NONE  Follow-Up: At [facility], you and your health needs are our priority.  As part of our continuing mission to provide you with exceptional heart care, we have created designated Provider Care Teams.  These Care Teams include your primary Cardiologist (physician) and Advanced Practice Providers (APPs -  Physician Assistants and Nurse Practitioners) who all work together to provide you with the care you need, when you need it.  Your next appointment:   3 month(s)  The format for your next appointment:   In Person  Provider:   Glenetta Hew, MD     Important Information About Sugar

## 2022-05-12 ENCOUNTER — Telehealth: Payer: Self-pay

## 2022-05-12 NOTE — Telephone Encounter (Signed)
Patient contacted the office to state that his radial artery harvest incision has a suture that is protruding out. He states that he first noticed it on Saturday and looked to be infected. He states that he kept it covered and placed triple antibiotic ointment on it. He states that it does look better now, not infected. Advised to contact the office back if he started to have signs or symptoms of infection again. Advised that it is usually left alone unless he is in the office and can be trimmed. He acknowledged receipt.

## 2022-05-14 ENCOUNTER — Encounter: Payer: Self-pay | Admitting: Cardiology

## 2022-05-16 ENCOUNTER — Other Ambulatory Visit: Payer: Self-pay

## 2022-05-16 ENCOUNTER — Telehealth: Payer: Self-pay

## 2022-05-16 MED ORDER — VASCEPA 1 G PO CAPS: 2.0000 g | ORAL_CAPSULE | Freq: Two times a day (BID) | ORAL | 3 refills | Status: DC

## 2022-05-16 MED ORDER — ICOSAPENT ETHYL 1 G PO CAPS: 2.0000 g | ORAL_CAPSULE | Freq: Two times a day (BID) | ORAL | 3 refills | Status: DC

## 2022-05-16 NOTE — Telephone Encounter (Addendum)
**Note De-Identified Kaori Jumper Obfuscation** Per a [facility] message from the pt, I checked his chart to see if he is to be taking Lovaza,which was listed on his hospital d/c meds, or Vascepa, which was approved for coverage while he was in the hospital as follows:  Pharmacy Patient Advocate Encounter Lenon Oms, CPhT  04/15/22  7:49 AM Insurance verification completed.     The patient is insured through [employer]   The patient is currently admitted and ran test claims for the following: Vascepa.   Copays and coinsurance results were relayed to Inpatient clinical team.  Lyndel Safe, CPhT Pharmacy Patient Advocate Specialist [place] Patient Advocate Team Direct Number: [phone]  Fax: [phone]  And then:  Willette Brace, CPhT 04/15/22  8:42 AM Note Received notification from [facility] regarding a prior authorization for Vascepa.    Authorization has been APPROVED.   Per test claim, copay for 30 days supply is $16    Key# [id number]   Approval dates: 04/15/22 TO 04/15/25  I sent a message to Merril Abbe, NP for clarification and the folllowing is her response: Mayra Reel, NP to Me      05/15/22  2:28 PM  I did not realize the PA was for Vascepa. It is okay for the patient to take Vascepa 2 gm bid.   Thank you!     I have called the pt and made him aware (he expressed much gratitude) of this and I have e-scribed his Vascepa 1 gm #360 with [facility] to [facility]. I then called [facility] [place] [place] - [address] (Ph: [phone]) and Benn Moulder who advised me that name brand Vascepa is covered and that the pt's co-pay is $0/90 day supply. Gerald Stabs also advised me that they are currently out of Vascepa but will have it on Monday for the pt to pick up and that they will contact the pt to let him know.

## 2022-05-20 NOTE — Telephone Encounter (Signed)
**Note De-Identified Kastin Cerda Obfuscation** The pt is taking Vascepa as follows: I have called the pt and made him aware (he expressed much gratitude) of this and I have e-scribed his Vascepa 1 gm #360 with 3 refills to [facility] to [facility].

## 2022-05-20 NOTE — Telephone Encounter (Signed)
Dustin Man, MD  Via, Deliah Boston, LPN 4 days ago    Prefer Vascepa over Lovaza.   [place]

## 2022-05-26 ENCOUNTER — Other Ambulatory Visit: Payer: Self-pay | Admitting: Thoracic Surgery (Cardiothoracic Vascular Surgery)

## 2022-05-26 DIAGNOSIS — Z951 Presence of aortocoronary bypass graft: Secondary | ICD-10-CM

## 2022-05-27 ENCOUNTER — Ambulatory Visit
Admission: RE | Admit: 2022-05-27 | Discharge: 2022-05-27 | Disposition: A | Discharge status: Home / Self Care | Payer: BC Managed Care – PPO | Source: Ambulatory Visit | Attending: Thoracic Surgery (Cardiothoracic Vascular Surgery) | Admitting: Thoracic Surgery (Cardiothoracic Vascular Surgery)

## 2022-05-27 ENCOUNTER — Ambulatory Visit (INDEPENDENT_AMBULATORY_CARE_PROVIDER_SITE_OTHER): Payer: Self-pay | Admitting: Surgical

## 2022-05-27 VITALS — BP 108/73 | HR 86 | Resp 20 | Ht 69.0 in | Wt 186.0 lb

## 2022-05-27 DIAGNOSIS — Z951 Presence of aortocoronary bypass graft: Secondary | ICD-10-CM

## 2022-05-27 NOTE — Patient Instructions (Signed)
Activity progression including driving instructions as discussed

## 2022-05-27 NOTE — Progress Notes (Signed)
[address]       [place] [address]             [phone]      Cloyd C Melland [facility] Medical Record #[id number] Date of Birth: 1955-10-28  Referring: Leonie Man, MD Primary Care: Susy Frizzle, MD Primary Cardiologist: Glenetta Hew, MD   Chief Complaint:   POST OP FOLLOW UP    04/17/2022 Patient:  Dustin Wood Pre-Op Dx: NSTEMI Single vessel CAD      Post-op Dx:  same Procedure: CABG X 2.  LITA LAD, Left radial artery to Diagonal (Y graft of LITA)       Surgeon and Role:      * Lightfoot, Lucile Crater, MD - Primary    Evonnie Pat , PA-C - assisting History of Present Illness:    The patient is a 66 year old male seen in the office on today's date and routine postsurgical follow-up status post the above described procedure.  He reports that he is feeling well.  There is no significant concerns.  He denies fevers, chills or other significant constitutional symptoms.  He is no longer taking narcotics or pain medicine.  He has had no difficulties with his incisions.  He is walking up to 3 miles a day.  He does not have any shortness of breath or anginal equivalents.  Overall he is quite pleased with his progress.      Past Medical History:  Diagnosis Date   Melanoma ([place]) 04/07/2019   right fore arm MIS TX EXC   SCC (squamous cell carcinoma) 06/30/2016   RIGHT DORSAL HAND CX3 5FU   SCC (squamous cell carcinoma) 06/30/2016   LEFT HAND TX CX3 5FU   Squamous cell carcinoma of skin 12/2015   RIGHT TEMPLE CX3 5FU     Social History   Tobacco Use  Smoking Status Never  Smokeless Tobacco Never    Social History   Substance and Sexual Activity  Alcohol Use Yes   Comment: rare     Allergies  Allergen Reactions   Penicillins Other (See Comments)    Childhood allergy    Current Outpatient Medications  Medication Sig Dispense Refill   amLODipine (NORVASC) 5 MG tablet Take 1 tablet (5 mg total) by mouth daily. 30 tablet 1    ascorbic acid (VITAMIN C) 500 MG tablet Take 500 mg by mouth daily.     aspirin EC 81 MG tablet Take 1 tablet (81 mg total) by mouth daily. Swallow whole. 30 tablet 12   blood glucose meter kit and supplies KIT Dispense based on patient and insurance preference. Use up to four times daily as directed. 1 each 0   Cholecalciferol (VITAMIN D3) 50 MCG (2000 UT) TABS Take 1 tablet by mouth daily.     clopidogrel (PLAVIX) 75 MG tablet Take 1 tablet (75 mg total) by mouth daily. 30 tablet 1   empagliflozin (JARDIANCE) 25 MG TABS tablet Take 1 tablet (25 mg total) by mouth daily before breakfast. 90 tablet 3   fluorouracil (EFUDEX) 5 % cream APPLY TO AFFECTED AREA(S) AT BEDTIME AS DIRECTED (Patient taking differently: Apply 1 Application topically daily as needed (for rash).) 40 g 1   metoprolol tartrate (LOPRESSOR) 25 MG tablet Take 0.5 tablets (12.5 mg total) by mouth 2 (two) times daily. 30 tablet 1   Multiple Vitamin (MULITIVITAMIN WITH MINERALS) TABS Take 1 tablet by mouth daily.     rosuvastatin (CRESTOR) 40  MG tablet Take 1 tablet (40 mg total) by mouth daily. (Patient taking differently: Take 20 mg by mouth daily.) 30 tablet 1   saw palmetto 500 MG capsule Take 500 mg by mouth in the morning, at noon, in the evening, and at bedtime.     Turmeric (QC TUMERIC COMPLEX) 500 MG CAPS Take 1 capsule by mouth daily.     VASCEPA 1 g capsule Take 2 capsules (2 g total) by mouth 2 (two) times daily. 360 capsule 3   zinc gluconate 50 MG tablet Take 50 mg by mouth daily.     No current facility-administered medications for this visit.       Physical Exam: BP 108/73 (BP Location: Right Arm, Patient Position: Sitting, Cuff Size: Normal)   Pulse 86   Resp 20   Ht _0  (1.753 m)   Wt 186 lb (84.4 kg)   SpO2 96% Comment: RA  BMI 27.47 kg/m   General appearance: alert, cooperative, and no distress Heart: regular rate and rhythm Lungs: clear to auscultation bilaterally Abdomen: Benign Extremities: No  edema Wound: Incisions well-healed without evidence of infection.  Left hand neurovascularly intact.   Diagnostic Studies & Laboratory data:     Recent Radiology Findings:   DG Chest 2 View  Result Date: 05/27/2022 CLINICAL DATA:  CABG EXAM: CHEST - 2 VIEW COMPARISON:  04/20/2022 FINDINGS: Prior median sternotomy. Heart and mediastinal contours are within normal limits. No focal opacities or effusions. No acute bony abnormality. No pneumothorax IMPRESSION: No active cardiopulmonary disease. Electronically Signed   By: Rolm Baptise M.D.   On: 05/27/2022 12:36      Recent Lab Findings: Lab Results  Component Value Date   WBC 14.6 (H) 04/20/2022   HGB 11.4 (L) 04/20/2022   HCT 34.0 (L) 04/20/2022   PLT 189 04/20/2022   GLUCOSE 140 (H) 04/20/2022   CHOL 173 04/14/2022   TRIG 349 (H) 04/14/2022   HDL 26 (L) 04/14/2022   LDLCALC 77 04/14/2022   ALT 30 04/14/2022   AST 25 04/14/2022   NA 136 04/20/2022   K 4.0 04/20/2022   CL 106 04/20/2022   CREATININE 0.92 04/20/2022   BUN 13 04/20/2022   CO2 22 04/20/2022   TSH 3.869 04/16/2022   INR 1.2 04/17/2022   HGBA1C 7.5 (H) 04/14/2022      Assessment / Plan: Patient continues to do quite well in his postsurgical recovery.  There are no surgical issues at this time.  I reviewed his chest x-ray and there are no concerning issues.  I did not make any changes to his current medication regimen.  We discussed routine activity progression including driving.  He has seen cardiology in they will continue to follow him from that perspective.  We will see the patient again on a as needed basis for any surgically related needs or at request.      Medication Changes: No orders of the defined types were placed in this encounter.     John Giovanni, PA-C  05/27/2022 12:57 PM

## 2022-05-28 ENCOUNTER — Telehealth (HOSPITAL_COMMUNITY): Payer: Self-pay

## 2022-05-28 ENCOUNTER — Telehealth (HOSPITAL_COMMUNITY): Payer: Self-pay | Admitting: Family Medicine

## 2022-05-28 NOTE — Telephone Encounter (Signed)
FMLA Form completed and faxed to [facility] [facility] @ [phone].beginning LOA 04/13/22 through 07/16/22. Form mailed to pt's home address.

## 2022-05-28 NOTE — Telephone Encounter (Signed)
Pt returned phone call and is interested in the cardiac rehab program, I advised pt once he is cleared that we would contact him at a later date for scheduling.

## 2022-06-03 ENCOUNTER — Telehealth (HOSPITAL_COMMUNITY): Payer: Self-pay | Admitting: Family Medicine

## 2022-06-08 ENCOUNTER — Other Ambulatory Visit: Payer: Self-pay | Admitting: Surgical

## 2022-06-10 ENCOUNTER — Other Ambulatory Visit: Payer: Self-pay | Admitting: Surgical

## 2022-06-11 ENCOUNTER — Other Ambulatory Visit: Payer: Self-pay

## 2022-06-11 DIAGNOSIS — R739 Hyperglycemia, unspecified: Secondary | ICD-10-CM

## 2022-06-11 DIAGNOSIS — E1122 Type 2 diabetes mellitus with diabetic chronic kidney disease: Secondary | ICD-10-CM

## 2022-06-11 DIAGNOSIS — Z951 Presence of aortocoronary bypass graft: Secondary | ICD-10-CM

## 2022-06-11 MED ORDER — ACCU-CHEK SOFTCLIX LANCETS MISC: 12 refills | Status: AC

## 2022-06-17 ENCOUNTER — Other Ambulatory Visit: Payer: Self-pay | Admitting: Surgical

## 2022-06-18 ENCOUNTER — Encounter: Payer: Self-pay | Admitting: Cardiology

## 2022-06-18 ENCOUNTER — Other Ambulatory Visit: Payer: Self-pay | Admitting: Surgical

## 2022-06-19 ENCOUNTER — Telehealth (HOSPITAL_COMMUNITY): Payer: Self-pay

## 2022-06-19 MED ORDER — CLOPIDOGREL BISULFATE 75 MG PO TABS: 75.0000 mg | ORAL_TABLET | Freq: Every day | ORAL | 3 refills | Status: DC

## 2022-06-19 MED ORDER — AMLODIPINE BESYLATE 5 MG PO TABS: 5.0000 mg | ORAL_TABLET | Freq: Every day | ORAL | 3 refills | Status: DC

## 2022-06-19 MED ORDER — ROSUVASTATIN CALCIUM 40 MG PO TABS: 40.0000 mg | ORAL_TABLET | Freq: Every day | ORAL | 3 refills | Status: DC

## 2022-06-19 MED ORDER — METOPROLOL TARTRATE 25 MG PO TABS: 12.5000 mg | ORAL_TABLET | Freq: Two times a day (BID) | ORAL | 3 refills | Status: DC

## 2022-06-19 NOTE — Telephone Encounter (Signed)
Called patient to see if he was interested in participating in the Cardiac Rehab Program. Patient stated yes. Patient will come in for orientation on 07/08/22 @ 1:15PM and will attend the 1:45PM exercise class.   Tourist information centre manager.

## 2022-06-19 NOTE — Telephone Encounter (Signed)
Pt insurance is active and benefits verified through [employer]. Co-pay $0.00, DED $1,500.00/$1,500.00 met, out of pocket $5,900.00/$5,900.00 met, co-insurance 30%. No pre-authorization required. Kay/[employer], 06/19/22 @ 12:34PM, DTO#[id number]   How many CR sessions are covered? (36 sessions for TCR, 72 sessions for ICR)72 Is this a lifetime maximum or an annual maximum? Lifetime Has the member used any of these services to date? No Is there a time limit (weeks/months) on start of program and/or program completion? No

## 2022-06-19 NOTE — Telephone Encounter (Signed)
Medication refilled as requested.  Rosuvastatin was refilled 40 mg dose.  There is comment in patient chart from 04/22/22 with Primary - patient was taking 20 mg only , but no change was med to  medication list

## 2022-06-30 ENCOUNTER — Encounter: Payer: BC Managed Care – PPO | Attending: Family Medicine | Admitting: Dietician

## 2022-06-30 ENCOUNTER — Encounter: Payer: Self-pay | Admitting: Dietician

## 2022-06-30 VITALS — Ht 69.0 in | Wt 186.0 lb

## 2022-06-30 DIAGNOSIS — E119 Type 2 diabetes mellitus without complications: Secondary | ICD-10-CM | POA: Insufficient documentation

## 2022-06-30 NOTE — Patient Instructions (Addendum)
Increase non-starchy vegetables (at least 1/2 your plate) Decreased saturated fat - lower fat milk, less cheese Walk as permitted by your MD.  Small amounts add up.  Goal 30 minutes per day.  You will work on this in Cardiac rehab.  Mindful snacking.  Are you really hungry or eating for another reason

## 2022-06-30 NOTE — Progress Notes (Signed)
Diabetes Self-Management Education  Visit Type: First/Initial  Appt. Start Time: 74 (late) Appt. End Time: 3500  06/30/2022  Dustin Wood, identified by name and date of birth, is a 66 y.o. male with a diagnosis of Diabetes: Type 2.   ASSESSMENT Patient is here today with his wife. They are hear for newly diagnosed diabetes.  Wife has had GDM and her father had type 2 diabetes.  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) Medications:  Jardiance, crestor, vascepa, plavix, tumeric, saw palmetto, zinc, vitamin C, vitamin D3 Labs:   Lab Results  Component Value Date   HGBA1C 7.5 (H) 04/14/2022   Lipid Panel     Component Value Date/Time   CHOL 173 04/14/2022 0220   TRIG 349 (H) 04/14/2022 0220   HDL 26 (L) 04/14/2022 0220   CHOLHDL 6.7 04/14/2022 0220   VLDL 70 (H) 04/14/2022 0220   LDLCALC 77 04/14/2022 0220   LDLCALC 95 06/22/2020 0811      Latest Ref Rng & Units 04/20/2022    2:52 AM 04/19/2022    3:45 AM 04/18/2022    5:00 PM  CMP  Glucose 70 - 99 mg/dL 140  153  142   BUN 8 - 23 mg/dL '13  10  10   '$ Creatinine 0.61 - 1.24 mg/dL 0.92  0.96  0.71   Sodium 135 - 145 mmol/L 136  136  136   Potassium 3.5 - 5.1 mmol/L 4.0  3.9  4.2   Chloride 98 - 111 mmol/L 106  103  105   CO2 22 - 32 mmol/L '22  24  25   '$ Calcium 8.9 - 10.3 mg/dL 8.4  8.5  7.8    Weight hx: 69" 189 lbs 06/30/2022 200 lbs 04/13/2022  Patient lives with his wife, daughter, and grandson.  His wife does most of the shopping and cooking.  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  He does get some walking and biking at work.  They also have horses.  He is doing the barn chores. He will start cardiac rehab soon.  Height '5\' 9"'$  (1.753 m), weight 186 lb (84.4 kg). Body mass index is 27.47 kg/m.   Diabetes Self-Management Education - 06/30/22 1043       Visit Information   Visit Type First/Initial      Initial Visit   Diabetes Type Type 2    Date Diagnosed 03/2022     Are you currently following a meal plan? Yes    What type of meal plan do you follow? low fat low sugar    Are you taking your medications as prescribed? Yes      Health Coping   How would you rate your overall health? Good      Psychosocial Assessment   Patient Belief/Attitude about Diabetes Motivated to manage diabetes    What is the hardest part about your diabetes right now, causing you the most concern, or is the most worrisome to you about your diabetes?   Making healty food and beverage choices    Self-care barriers None    Self-management support Doctor's office    Other persons present Patient    Patient Concerns Nutrition/Meal planning;Glycemic Control;Healthy Lifestyle    Special Needs None    Learning Readiness Ready    How often do you need to have someone help you when you read instructions, pamphlets, or other written materials from your doctor or pharmacy? 1 - Never  What is the last grade level you completed in school? BS      Pre-Education Assessment   Patient understands the diabetes disease and treatment process. Needs Instruction    Patient understands incorporating nutritional management into lifestyle. Needs Instruction    Patient undertands incorporating physical activity into lifestyle. Needs Instruction    Patient understands using medications safely. Needs Instruction    Patient understands monitoring blood glucose, interpreting and using results Needs Instruction    Patient understands prevention, detection, and treatment of acute complications. Needs Instruction    Patient understands prevention, detection, and treatment of chronic complications. Needs Instruction    Patient understands how to develop strategies to address psychosocial issues. Needs Instruction    Patient understands how to develop strategies to promote health/change behavior. Needs Instruction      Complications   Last HgB A1C per patient/outside source 7.5 %   9/225/2023   How often  do you check your blood sugar? 1-2 times/day    Fasting Blood glucose range (mg/dL) 70-129   115-135   Number of hypoglycemic episodes per month 0    Number of hyperglycemic episodes ( >'200mg'$ /dL): Never    Have you had a dilated eye exam in the past 12 months? Yes    Have you had a dental exam in the past 12 months? No    Are you checking your feet? Yes    How many days per week are you checking your feet? 7      Dietary Intake   Breakfast Quaker oat squares or other cold cereal, whole milk    Snack (morning) occasional fresh fruit, unsalted nuts OR cheese and LS pita crackers.    Lunch veggie burger and cheese wrapped in a tortilla    Snack (afternoon) similar to the am    Dinner chicken pot pie with bisquick top (homemade)    Snack (evening) sugar free cookies and sugar free reeces cups    Beverage(s) water, diet soda (4 daily),black coffee, whole milk      Activity / Exercise   Activity / Exercise Type ADL's   plus walking at work   How many days per week do you exercise? 3    How many minutes per day do you exercise? 10    Total minutes per week of exercise 30      Patient Education   Previous Diabetes Education Yes (please comment)    Disease Pathophysiology Definition of diabetes, type 1 and 2, and the diagnosis of diabetes    Healthy Eating Role of diet in the treatment of diabetes and the relationship between the three main macronutrients and blood glucose level;Food label reading, portion sizes and measuring food.;Plate Method;Meal options for control of blood glucose level and chronic complications.;Effects of alcohol on blood glucose and safety factors with consumption of alcohol.    Being Active Role of exercise on diabetes management, blood pressure control and cardiac health.;Helped patient identify appropriate exercises in relation to his/her diabetes, diabetes complications and other health issue.    Medications Reviewed patients medication for diabetes, action, purpose,  timing of dose and side effects.    Monitoring Identified appropriate SMBG and/or A1C goals.;Taught/discussed recording of test results and interpretation of SMBG.;Daily foot exams;Yearly dilated eye exam    Acute complications Taught prevention, symptoms, and  treatment of hypoglycemia - the 15 rule.    Chronic complications Relationship between chronic complications and blood glucose control    Diabetes Stress and Support Role of stress on  diabetes;Worked with patient to identify barriers to care and solutions;Identified and addressed patients feelings and concerns about diabetes      Individualized Goals (developed by patient)   Nutrition Follow meal plan discussed;General guidelines for healthy choices and portions discussed    Physical Activity Exercise 5-7 days per week;30 minutes per day    Medications take my medication as prescribed    Monitoring  Test my blood glucose as discussed;Consistenly use CGM    Problem Solving Eating Pattern    Reducing Risk do foot checks daily      Post-Education Assessment   Patient understands the diabetes disease and treatment process. Demonstrates understanding / competency    Patient understands incorporating nutritional management into lifestyle. Demonstrates understanding / competency    Patient undertands incorporating physical activity into lifestyle. Demonstrates understanding / competency    Patient understands using medications safely. Demonstrates understanding / competency    Patient understands monitoring blood glucose, interpreting and using results Demonstrates understanding / competency    Patient understands prevention, detection, and treatment of acute complications. Demonstrates understanding / competency    Patient understands prevention, detection, and treatment of chronic complications. Demonstrates understanding / competency    Patient understands how to develop strategies to address psychosocial issues. Demonstrates understanding /  competency    Patient understands how to develop strategies to promote health/change behavior. Demonstrates understanding / competency      Outcomes   Expected Outcomes Demonstrated interest in learning. Expect positive outcomes    Future DMSE PRN    Program Status Completed             Individualized Plan for Diabetes Self-Management Training:   Learning Objective:  Patient will have a greater understanding of diabetes self-management. Patient education plan is to attend individual and/or group sessions per assessed needs and concerns.   Plan:   Patient Instructions  Increase non-starchy vegetables (at least 1/2 your plate) Decreased saturated fat - lower fat milk, less cheese Walk as permitted by your MD.  Small amounts add up.  Goal 30 minutes per day.  You will work on this in Cardiac rehab.  Mindful snacking.  Are you really hungry or eating for another reason  Expected Outcomes:  Demonstrated interest in learning. Expect positive outcomes  Education material provided: ADA - How to Thrive: A Guide for Your Journey with Diabetes, Food label handouts, Meal plan card, Snack sheet, and Diabetes Resources  If problems or questions, patient to contact team via:  Phone  Future DSME appointment: PRN

## 2022-07-03 ENCOUNTER — Telehealth: Payer: Self-pay | Admitting: Family Medicine

## 2022-07-03 ENCOUNTER — Other Ambulatory Visit: Payer: Self-pay

## 2022-07-03 DIAGNOSIS — E1122 Type 2 diabetes mellitus with diabetic chronic kidney disease: Secondary | ICD-10-CM

## 2022-07-03 MED ORDER — GLUCOSE BLOOD VI STRP: ORAL_STRIP | 12 refills | Status: DC

## 2022-07-03 NOTE — Telephone Encounter (Signed)
[facility] Prescription Request  07/03/2022  Is this a "Controlled Substance" medicine? No  LOV: 04/22/2022  What is the name of the medication or equipment? Accu-chek guid test strip 200 qty  Have you contacted your pharmacy to request a refill? Yes   Which pharmacy would you like this sent to?  [facility], [place] - [address] [place] [address] Phone: [phone] Fax: [phone]    Patient notified that their request is being sent to the clinical staff for review and that they should receive a response within 2 business days.   Please advise at [facility] [phone]

## 2022-07-03 NOTE — Telephone Encounter (Signed)
Accu-chek guid test strip is not on current list, routing for review.

## 2022-07-04 ENCOUNTER — Other Ambulatory Visit: Payer: Self-pay | Admitting: *Deleted

## 2022-07-04 DIAGNOSIS — N183 Chronic kidney disease, stage 3 unspecified: Secondary | ICD-10-CM

## 2022-07-04 NOTE — Telephone Encounter (Signed)
  Media Information  Document Information  AMB Correspondence  REFILL  07/04/2022 08:50  Attached To:  Herbert Seta Pietro "[employer], Provider, MD

## 2022-07-04 NOTE — Telephone Encounter (Signed)
Refilled yesterday 07/03/22.

## 2022-07-07 ENCOUNTER — Telehealth (HOSPITAL_COMMUNITY): Payer: Self-pay

## 2022-07-07 ENCOUNTER — Encounter: Payer: Self-pay | Admitting: Family Medicine

## 2022-07-07 ENCOUNTER — Encounter: Payer: Self-pay | Admitting: Cardiology

## 2022-07-08 ENCOUNTER — Encounter (HOSPITAL_COMMUNITY)
Admission: RE | Admit: 2022-07-08 | Discharge: 2022-07-08 | Disposition: A | Discharge status: Home / Self Care | Payer: BC Managed Care – PPO | Source: Ambulatory Visit | Attending: Cardiology | Admitting: Cardiology

## 2022-07-08 ENCOUNTER — Encounter (HOSPITAL_COMMUNITY): Payer: Self-pay

## 2022-07-08 VITALS — BP 112/68 | HR 74 | Ht 69.0 in | Wt 186.3 lb

## 2022-07-08 DIAGNOSIS — Z951 Presence of aortocoronary bypass graft: Secondary | ICD-10-CM | POA: Insufficient documentation

## 2022-07-08 DIAGNOSIS — I214 Non-ST elevation (NSTEMI) myocardial infarction: Secondary | ICD-10-CM | POA: Insufficient documentation

## 2022-07-08 HISTORY — DX: Hyperlipidemia, unspecified: E78.5

## 2022-07-08 NOTE — Progress Notes (Signed)
Cardiac Rehab Medication Review by a Nurse  Does the patient  feel that his/her medications are working for him/her?  yes  Has the patient been experiencing any side effects to the medications prescribed?  no  Does the patient measure his/her own blood pressure or blood glucose at home?  yes   Does the patient have any problems obtaining medications due to transportation or finances?   no  Understanding of regimen: good Understanding of indications: good Potential of compliance: good    Nurse comments: Gerald Stabs is taking his medications as prescribed and has a good understanding of what his medications are for. Gerald Stabs checks his CBG's daily. Gerald Stabs has a BP cuff / monitor he does not check his blood pressure on a daily basis.    Christa See Kit Brubacher RN 07/08/2022 3:54 PM

## 2022-07-08 NOTE — Telephone Encounter (Signed)
I will consult my Lipidology Expert.    Mali - have u seen this article. ?? Kinda counters all that we do -- not sure how much to believe.  [place]

## 2022-07-08 NOTE — Progress Notes (Signed)
Cardiac Individual Treatment Plan  Patient Details  Name: Dustin Wood MRN: [id number] Date of Birth: July 11, 1956 Referring Provider:   [place] from 07/08/2022 in 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 Swallow whole., Disp: 30 tablet, Rfl: 12   Cholecalciferol (VITAMIN D3) 50 MCG (2000 UT) TABS, Take 2,000 Units by mouth in the morning., Disp: , Rfl:    clopidogrel (PLAVIX) 75 MG tablet, Take 1 tablet (75 mg total) by mouth daily., Disp: 90 tablet, Rfl: 3   empagliflozin (JARDIANCE) 25 MG TABS tablet, Take 1 tablet (25 mg total) by mouth daily before breakfast., Disp: 90 tablet, Rfl: 3   glucose blood test strip, Use to check blood sugars up to 4 times per day., Disp: 100 each, Rfl: 12   metoprolol tartrate (LOPRESSOR) 25 MG tablet, Take 0.5 tablets (12.5 mg total) by mouth 2 (two) times daily., Disp: 90 tablet, Rfl: 3   rosuvastatin (CRESTOR) 40 MG tablet, Take 1 tablet (40 mg total) by mouth daily. (Patient taking differently: Take 20 mg by mouth every evening.), Disp: 90 tablet, Rfl: 3   Saw Palmetto, Serenoa repens, 540 MG CAPS, Take 540 mg by mouth in the morning., Disp: , Rfl:    Turmeric (QC TUMERIC COMPLEX) 500 MG CAPS, Take 1 capsule by mouth in the morning., Disp: , Rfl:    VASCEPA 1 g capsule, Take 2 capsules (2 g total) by mouth 2 (two) times daily., Disp: 360 capsule, Rfl: 3    Accu-Chek Softclix Lancets lancets, Use as instructed, Disp: 100 each, Rfl: 12   ascorbic acid (VITAMIN C) 500 MG tablet, Take 500 mg by mouth daily., Disp: , Rfl:    blood glucose meter kit and supplies KIT, Dispense based on patient and insurance preference. Use up to four times daily as directed., Disp: 1 each, Rfl: 0   fluorouracil (EFUDEX) 5 % cream, APPLY TO AFFECTED AREA(S) AT BEDTIME AS DIRECTED (Patient taking differently: Apply 1 Application topically daily as needed (for rash).), Disp: 40 g, Rfl: 1   metFORMIN (GLUCOPHAGE) 500 MG tablet, Take 500 mg by mouth 2 (two) times daily., Disp: , Rfl:    Multiple Vitamin (MULITIVITAMIN WITH MINERALS) TABS, Take 1 tablet by mouth daily., Disp: , Rfl:    zinc gluconate 50 MG tablet, Take 50 mg by mouth daily., Disp: , Rfl:   Past Medical History: Past Medical History:  Diagnosis Date   Coronary artery disease    Diabetes mellitus without complication ([place])    Hyperlipidemia    Melanoma ([place]) 04/07/2019   right fore arm MIS TX EXC   Myocardial infarction ([place])    SCC (squamous cell carcinoma) 06/30/2016   RIGHT DORSAL HAND CX3 5FU   SCC (squamous cell carcinoma) 06/30/2016   LEFT HAND TX CX3 5FU   Squamous cell carcinoma of skin 12/2015   RIGHT TEMPLE CX3  5FU    Tobacco Use: Social History   Tobacco Use  Smoking Status Never  Smokeless Tobacco Never    Labs: Review Flowsheet  More data may exist      Latest Ref Rng & Units 08/06/2017 06/22/2020 04/14/2022 04/17/2022 04/18/2022  Labs for ITP Cardiac and Pulmonary Rehab  Cholestrol 0 - 200 mg/dL 182  157  173  - -  LDL (calc) 0 - 99 mg/dL 125  95  77  - -  HDL-C >40 mg/dL 32  29  26  - -  Trlycerides <150 mg/dL 135  240  349  - -  Hemoglobin A1c 4.8 - 5.6 % - - 7.5  - -  PH, Arterial 7.35 - 7.45 - - - 7.383  7.364  7.348  7.222  7.284  7.261  7.331  7.409  7.306  7.39  7.385   PCO2 arterial 32 - 48 mmHg - - - 36.8  36.9  43.0  43.3  43.7  43.1  44.8  37.4  52.9  37  38.0    Bicarbonate 20.0 - 28.0 mmol/L - - - 21.9  21.1  23.6  17.9  20.7  19.4  23.7  23.1  23.7  26.4  22.4  22.7   TCO2 22 - 32 mmol/L - - - _0 Acid-base deficit 0.0 - 2.0 mmol/L - - - 3.0  4.0  2.0  10.0  6.0  7.0  2.0  3.0  1.0  1.0  2.2  2.0   O2 Saturation % - - - 92  91  92  92  90  97  100  75  100  100  96.6  96     Capillary Blood Glucose: Lab Results  Component Value Date   GLUCAP 120 (H) 04/21/2022   GLUCAP 153 (H) 04/21/2022   GLUCAP 158 (H) 04/20/2022   GLUCAP 189 (H) 04/20/2022   GLUCAP 187 (H) 04/20/2022     Exercise Target Goals: Exercise Program Goal: Individual exercise prescription set using results from initial 6 min walk test and THRR while considering  patient's activity barriers and safety.   Exercise Prescription Goal: Initial exercise prescription builds to 30-45 minutes a day of aerobic activity, 2-3 days per week.  Home exercise guidelines will be given to patient during program as part of exercise prescription that the participant will acknowledge.  Activity Barriers & Risk Stratification:  Activity Barriers & Cardiac Risk Stratification - 07/08/22 1537       Activity Barriers & Cardiac Risk Stratification   Activity Barriers Joint Problems;Muscular Weakness;Deconditioning    Cardiac Risk Stratification High             6 Minute Walk:  6 Minute Walk     Row Name 07/08/22 1410         6 Minute Walk   Phase Initial     Distance 1400 feet     Walk Time 6 minutes     # of Rest Breaks 0     MPH 2.7     METS 3.3     RPE 8     Perceived Dyspnea  0     VO2 Peak 11.5     Symptoms No     Resting HR 69 bpm     Resting BP  112/68     Resting Oxygen Saturation  96 %     Exercise Oxygen Saturation  during 6 min walk 98 %     Max Ex. HR 92 bpm     Max Ex. BP 120/66     2 Minute Post BP 110/60              Oxygen Initial Assessment:   Oxygen Re-Evaluation:   Oxygen  Discharge (Final Oxygen Re-Evaluation):   Initial Exercise Prescription:  Initial Exercise Prescription - 07/08/22 1500       Date of Initial Exercise RX and Referring Provider   Date 07/08/22    Referring Provider Glenetta Hew, MD    Expected Discharge Date 09/05/22      Recumbant Bike   Level 2    RPM 60    Watts 25    Minutes 15    METs 3.3      Arm Ergometer   Level 2    Watts 25    RPM 60    Minutes 15    METs 3.3      Prescription Details   Frequency (times per week) 3    Duration Progress to 30 minutes of continuous aerobic without signs/symptoms of physical distress      Intensity   THRR 40-80% of Max Heartrate 62-125    Ratings of Perceived Exertion 11-13    Perceived Dyspnea 0-4      Progression   Progression Continue progressive overload as per policy without signs/symptoms or physical distress.      Resistance Training   Training Prescription Yes    Weight 4 lbs    Reps 10-15             Perform Capillary Blood Glucose checks as needed.  Exercise Prescription Changes:   Exercise Comments:   Exercise Goals and Review:   Exercise Goals     Row Name 07/08/22 1538             Exercise Goals   Increase Physical Activity Yes       Intervention Provide advice, education, support and counseling about physical activity/exercise needs.;Develop an individualized exercise prescription for aerobic and resistive training based on initial evaluation findings, risk stratification, comorbidities and participant's personal goals.       Expected Outcomes Short Term: Attend rehab on a regular basis to increase amount of physical activity.;Long Term: Add in home exercise to make exercise part of routine and to increase amount of physical activity.;Long Term: Exercising regularly at least 3-5 days a week.       Increase Strength and Stamina Yes       Intervention Provide advice, education, support and counseling about physical activity/exercise  needs.;Develop an individualized exercise prescription for aerobic and resistive training based on initial evaluation findings, risk stratification, comorbidities and participant's personal goals.       Expected Outcomes Short Term: Increase workloads from initial exercise prescription for resistance, speed, and METs.;Short Term: Perform resistance training exercises routinely during rehab and add in resistance training at home;Long Term: Improve cardiorespiratory fitness, muscular endurance and strength as measured by increased METs and functional capacity (6MWT)       Able to understand and use rate of perceived exertion (RPE) scale Yes       Intervention Provide education and explanation on how to use RPE scale       Expected Outcomes Short Term: Able to use RPE daily in rehab to express subjective intensity level;Long Term:  Able to use RPE to guide intensity level when exercising independently       Knowledge and understanding of Target Heart Rate Range (THRR) Yes       Intervention Provide education and explanation of THRR including how the numbers were predicted and where they are located for reference       Expected Outcomes Short Term: Able to state/look up THRR;Short Term: Able to use daily as guideline for intensity in rehab;Long Term: Able to use THRR to govern intensity when exercising independently       Understanding of Exercise Prescription Yes       Intervention Provide education, explanation, and written materials on patient's individual exercise prescription       Expected Outcomes Short Term: Able to explain program exercise prescription;Long Term: Able to explain home exercise prescription to exercise independently                Exercise Goals Re-Evaluation :   Discharge Exercise Prescription (Final Exercise Prescription Changes):   Nutrition:  Target Goals: Understanding of nutrition guidelines, daily intake of sodium <1559m, cholesterol <2062m calories 30% from fat  and 7% or less from saturated fats, daily to have 5 or more servings of fruits and vegetables.  Biometrics:  Pre Biometrics - 07/08/22 1328       Pre Biometrics   Waist Circumference 39.25 inches    Hip Circumference 39.75 inches    Waist to Hip Ratio 0.99 %    Triceps Skinfold 7 mm    % Body Fat 23.8 %    Grip Strength 30 kg    Flexibility 11 in    Single Leg Stand 30 seconds              Nutrition Therapy Plan and Nutrition Goals:   Nutrition Assessments:  MEDIFICTS Score Key: ?70 Need to make dietary changes  40-70 Heart Healthy Diet ? 40 Therapeutic Level Cholesterol Diet    Picture Your Plate Scores: <4<97nhealthy dietary pattern with much room for improvement. 41-50 Dietary pattern unlikely to meet recommendations for good health and room for improvement. 51-60 More healthful dietary pattern, with some room for improvement.  >60 Healthy dietary pattern, although there may be some specific behaviors that could be improved.    Nutrition Goals Re-Evaluation:   Nutrition Goals Re-Evaluation:   Nutrition Goals Discharge (Final Nutrition Goals Re-Evaluation):   Psychosocial: Target Goals: Acknowledge presence or absence of significant depression and/or stress, maximize coping skills, provide positive support system. Participant is able to verbalize types and ability to use techniques and skills needed for reducing stress and depression.  Initial Review & Psychosocial Screening:  Initial Psych Review & Screening - 07/08/22 1542       Initial Review   Current issues with None Identified      Family Dynamics   Good Support System? Yes   ChGerald Stabsas his wife for support     Barriers   Psychosocial barriers to participate in program There are no identifiable barriers or psychosocial needs.      Screening Interventions   Interventions Encouraged to exercise             Quality of Life Scores:  Quality of Life - 07/08/22 1534       Quality of Life    Select Quality of Life      Quality of Life Scores   Health/Function Pre 21.8 %    Socioeconomic Pre 19.86 %    Psych/Spiritual Pre 20.21 %  Family Pre 22.3 %    GLOBAL Pre 21.15 %            Scores of 19 and below usually indicate a poorer quality of life in these areas.  A difference of  2-3 points is a clinically meaningful difference.  A difference of 2-3 points in the total score of the Quality of Life Index has been associated with significant improvement in overall quality of life, self-image, physical symptoms, and general health in studies assessing change in quality of life.  PHQ-9: Review Flowsheet  More data exists      07/08/2022 06/30/2022 04/22/2022 06/29/2020 08/11/2017  Depression screen PHQ 2/9  Decreased Interest 0 0 0 0 0  Down, Depressed, Hopeless 0 0 0 0 0  PHQ - 2 Score 0 0 0 0 0  Altered sleeping - - - 0 -  Tired, decreased energy - - - 0 -  Change in appetite - - - 0 -  Feeling bad or failure about yourself  - - - 0 -  Trouble concentrating - - - 0 -  Moving slowly or fidgety/restless - - - 0 -  Suicidal thoughts - - - 0 -  PHQ-9 Score - - - 0 -  Difficult doing work/chores - - - Not difficult at all -   Interpretation of Total Score  Total Score Depression Severity:  1-4 = Minimal depression, 5-9 = Mild depression, 10-14 = Moderate depression, 15-19 = Moderately severe depression, 20-27 = Severe depression   Psychosocial Evaluation and Intervention:   Psychosocial Re-Evaluation:   Psychosocial Discharge (Final Psychosocial Re-Evaluation):   Vocational Rehabilitation: Provide vocational rehab assistance to qualifying candidates.   Vocational Rehab Evaluation & Intervention:  Vocational Rehab - 07/08/22 1543       Initial Vocational Rehab Evaluation & Intervention   Assessment shows need for Vocational Rehabilitation No   Gerald Stabs works full time at [employer] and does not need vocational rehab at this time             Education: Education Goals: Education classes will be provided on a weekly basis, covering required topics. Participant will state understanding/return demonstration of topics presented.     Core Videos: Exercise    Move It!  Clinical staff conducted group or individual video education with verbal and written material and guidebook.  Patient learns the recommended Pritikin exercise program. Exercise with the goal of living a long, healthy life. Some of the health benefits of exercise include controlled diabetes, healthier blood pressure levels, improved cholesterol levels, improved heart and lung capacity, improved sleep, and better body composition. Everyone should speak with their doctor before starting or changing an exercise routine.  Biomechanical Limitations Clinical staff conducted group or individual video education with verbal and written material and guidebook.  Patient learns how biomechanical limitations can impact exercise and how we can mitigate and possibly overcome limitations to have an impactful and balanced exercise routine.  Body Composition Clinical staff conducted group or individual video education with verbal and written material and guidebook.  Patient learns that body composition (ratio of muscle mass to fat mass) is a key component to assessing overall fitness, rather than body weight alone. Increased fat mass, especially visceral belly fat, can put [place] at increased risk for metabolic syndrome, type 2 diabetes, heart disease, and even death. It is recommended to combine diet and exercise (cardiovascular and resistance training) to improve your body composition. Seek guidance from your physician and exercise physiologist before implementing  an exercise routine.  Exercise Action Plan Clinical staff conducted group or individual video education with verbal and written material and guidebook.  Patient learns the recommended strategies to achieve and enjoy long-term  exercise adherence, including variety, self-motivation, self-efficacy, and positive decision making. Benefits of exercise include fitness, good health, weight management, more energy, better sleep, less stress, and overall well-being.  Medical   Heart Disease Risk Reduction Clinical staff conducted group or individual video education with verbal and written material and guidebook.  Patient learns our heart is our most vital organ as it circulates oxygen, nutrients, white blood cells, and hormones throughout the entire body, and carries waste away. Data supports a plant-based eating plan like the Pritikin Program for its effectiveness in slowing progression of and reversing heart disease. The video provides a number of recommendations to address heart disease.   Metabolic Syndrome and Belly Fat  Clinical staff conducted group or individual video education with verbal and written material and guidebook.  Patient learns what metabolic syndrome is, how it leads to heart disease, and how one can reverse it and keep it from coming back. You have metabolic syndrome if you have 3 of the following 5 criteria: abdominal obesity, high blood pressure, high triglycerides, low HDL cholesterol, and high blood sugar.  Hypertension and Heart Disease Clinical staff conducted group or individual video education with verbal and written material and guidebook.  Patient learns that high blood pressure, or hypertension, is very common in the [place]. Hypertension is largely due to excessive salt intake, but other important risk factors include being overweight, physical inactivity, drinking too much alcohol, smoking, and not eating enough potassium from fruits and vegetables. High blood pressure is a leading risk factor for heart attack, stroke, congestive heart failure, dementia, kidney failure, and premature death. Long-term effects of excessive salt intake include stiffening of the arteries and thickening of heart  muscle and organ damage. Recommendations include ways to reduce hypertension and the risk of heart disease.  Diseases of Our Time - Focusing on Diabetes Clinical staff conducted group or individual video education with verbal and written material and guidebook.  Patient learns why the best way to stop diseases of our time is prevention, through food and other lifestyle changes. Medicine (such as prescription pills and surgeries) is often only a Band-Aid on the problem, not a long-term solution. Most common diseases of our time include obesity, type 2 diabetes, hypertension, heart disease, and cancer. The Pritikin Program is recommended and has been proven to help reduce, reverse, and/or prevent the damaging effects of metabolic syndrome.  Nutrition   Overview of the Pritikin Eating Plan  Clinical staff conducted group or individual video education with verbal and written material and guidebook.  Patient learns about the [place] for disease risk reduction. The [place] emphasizes a wide variety of unrefined, minimally-processed carbohydrates, like fruits, vegetables, whole grains, and legumes. Go, Caution, and Stop food choices are explained. Plant-based and lean animal proteins are emphasized. Rationale provided for low sodium intake for blood pressure control, low added sugars for blood sugar stabilization, and low added fats and oils for coronary artery disease risk reduction and weight management.  Calorie Density  Clinical staff conducted group or individual video education with verbal and written material and guidebook.  Patient learns about calorie density and how it impacts the Pritikin Eating Plan. Knowing the characteristics of the food you choose will help you decide whether those foods will lead to weight gain or  weight loss, and whether you want to consume more or less of them. Weight loss is usually a side effect of the Pritikin Eating Plan because of its focus on  low calorie-dense foods.  Label Reading  Clinical staff conducted group or individual video education with verbal and written material and guidebook.  Patient learns about the Pritikin recommended label reading guidelines and corresponding recommendations regarding calorie density, added sugars, sodium content, and whole grains.  Dining Out - Part 1  Clinical staff conducted group or individual video education with verbal and written material and guidebook.  Patient learns that restaurant meals can be sabotaging because they can be so high in calories, fat, sodium, and/or sugar. Patient learns recommended strategies on how to positively address this and avoid unhealthy pitfalls.  Facts on Fats  Clinical staff conducted group or individual video education with verbal and written material and guidebook.  Patient learns that lifestyle modifications can be just as effective, if not more so, as many medications for lowering your risk of heart disease. A Pritikin lifestyle can help to reduce your risk of inflammation and atherosclerosis (cholesterol build-up, or plaque, in the artery walls). Lifestyle interventions such as dietary choices and physical activity address the cause of atherosclerosis. A review of the types of fats and their impact on blood cholesterol levels, along with dietary recommendations to reduce fat intake is also included.  Nutrition Action Plan  Clinical staff conducted group or individual video education with verbal and written material and guidebook.  Patient learns how to incorporate Pritikin recommendations into their lifestyle. Recommendations include planning and keeping personal health goals in mind as an important part of their success.  Healthy Mind-Set    Healthy Minds, Bodies, Hearts  Clinical staff conducted group or individual video education with verbal and written material and guidebook.  Patient learns how to identify when they are stressed. Video will discuss  the impact of that stress, as well as the many benefits of stress management. Patient will also be introduced to stress management techniques. The way we think, act, and feel has an impact on our hearts.  How Our Thoughts Can Heal Our Hearts  Clinical staff conducted group or individual video education with verbal and written material and guidebook.  Patient learns that negative thoughts can cause depression and anxiety. This can result in negative lifestyle behavior and serious health problems. Cognitive behavioral therapy is an effective method to help control our thoughts in order to change and improve our emotional outlook.  Additional Videos:  Exercise    Improving Performance  Clinical staff conducted group or individual video education with verbal and written material and guidebook.  Patient learns to use a non-linear approach by alternating intensity levels and lengths of time spent exercising to help burn more calories and lose more body fat. Cardiovascular exercise helps improve heart health, metabolism, hormonal balance, blood sugar control, and recovery from fatigue. Resistance training improves strength, endurance, balance, coordination, reaction time, metabolism, and muscle mass. Flexibility exercise improves circulation, posture, and balance. Seek guidance from your physician and exercise physiologist before implementing an exercise routine and learn your capabilities and proper form for all exercise.  Introduction to Yoga  Clinical staff conducted group or individual video education with verbal and written material and guidebook.  Patient learns about yoga, a discipline of the coming together of mind, breath, and body. The benefits of yoga include improved flexibility, improved range of motion, better posture and core strength, increased lung function, weight loss, and  positive self-image. Yoga's heart health benefits include lowered blood pressure, healthier heart rate, decreased  cholesterol and triglyceride levels, improved immune function, and reduced stress. Seek guidance from your physician and exercise physiologist before implementing an exercise routine and learn your capabilities and proper form for all exercise.  Medical   Aging: Enhancing Your Quality of Life  Clinical staff conducted group or individual video education with verbal and written material and guidebook.  Patient learns key strategies and recommendations to stay in good physical health and enhance quality of life, such as prevention strategies, having an advocate, securing a [place], and keeping a list of medications and system for tracking them. It also discusses how to avoid risk for bone loss.  Biology of Weight Control  Clinical staff conducted group or individual video education with verbal and written material and guidebook.  Patient learns that weight gain occurs because we consume more calories than we burn (eating more, moving less). Even if your body weight is normal, you may have higher ratios of fat compared to muscle mass. Too much body fat puts you at increased risk for cardiovascular disease, heart attack, stroke, type 2 diabetes, and obesity-related cancers. In addition to exercise, following the [place] can help reduce your risk.  Decoding Lab Results  Clinical staff conducted group or individual video education with verbal and written material and guidebook.  Patient learns that lab test reflects one measurement whose values change over time and are influenced by many factors, including medication, stress, sleep, exercise, food, hydration, pre-existing medical conditions, and more. It is recommended to use the knowledge from this video to become more involved with your lab results and evaluate your numbers to speak with your doctor.   Diseases of Our Time - Overview  Clinical staff conducted group or individual video education with verbal  and written material and guidebook.  Patient learns that according to the CDC, 50% to 70% of chronic diseases (such as obesity, type 2 diabetes, elevated lipids, hypertension, and heart disease) are avoidable through lifestyle improvements including healthier food choices, listening to satiety cues, and increased physical activity.  Sleep Disorders Clinical staff conducted group or individual video education with verbal and written material and guidebook.  Patient learns how good quality and duration of sleep are important to overall health and well-being. Patient also learns about sleep disorders and how they impact health along with recommendations to address them, including discussing with a physician.  Nutrition  Dining Out - Part 2 Clinical staff conducted group or individual video education with verbal and written material and guidebook.  Patient learns how to plan ahead and communicate in order to maximize their dining experience in a healthy and nutritious manner. Included are recommended food choices based on the type of restaurant the patient is visiting.   Fueling a Best boy conducted group or individual video education with verbal and written material and guidebook.  There is a strong connection between our food choices and our health. Diseases like obesity and type 2 diabetes are very prevalent and are in large-part due to lifestyle choices. The Pritikin Eating Plan provides plenty of food and hunger-curbing satisfaction. It is easy to follow, affordable, and helps reduce health risks.  Menu Workshop  Clinical staff conducted group or individual video education with verbal and written material and guidebook.  Patient learns that restaurant meals can sabotage health goals because they are often packed with calories, fat, sodium, and  sugar. Recommendations include strategies to plan ahead and to communicate with the manager, chef, or server to help order a healthier  meal.  Planning Your Eating Strategy  Clinical staff conducted group or individual video education with verbal and written material and guidebook.  Patient learns about the [place] and its benefit of reducing the risk of disease. The [place] does not focus on calories. Instead, it emphasizes high-quality, nutrient-rich foods. By knowing the characteristics of the foods, we choose, we can determine their calorie density and make informed decisions.  Targeting Your Nutrition Priorities  Clinical staff conducted group or individual video education with verbal and written material and guidebook.  Patient learns that lifestyle habits have a tremendous impact on disease risk and progression. This video provides eating and physical activity recommendations based on your personal health goals, such as reducing LDL cholesterol, losing weight, preventing or controlling type 2 diabetes, and reducing high blood pressure.  Vitamins and Minerals  Clinical staff conducted group or individual video education with verbal and written material and guidebook.  Patient learns different ways to obtain key vitamins and minerals, including through a recommended healthy diet. It is important to discuss all supplements you take with your doctor.   Healthy Mind-Set    Smoking Cessation  Clinical staff conducted group or individual video education with verbal and written material and guidebook.  Patient learns that cigarette smoking and tobacco addiction pose a serious health risk which affects millions of people. Stopping smoking will significantly reduce the risk of heart disease, lung disease, and many forms of cancer. Recommended strategies for quitting are covered, including working with your doctor to develop a successful plan.  Culinary   Becoming a Financial trader conducted group or individual video education with verbal and written material and guidebook.  Patient learns  that cooking at home can be healthy, cost-effective, quick, and puts them in control. Keys to cooking healthy recipes will include looking at your recipe, assessing your equipment needs, planning ahead, making it simple, choosing cost-effective seasonal ingredients, and limiting the use of added fats, salts, and sugars.  Cooking - Breakfast and Snacks  Clinical staff conducted group or individual video education with verbal and written material and guidebook.  Patient learns how important breakfast is to satiety and nutrition through the entire day. Recommendations include key foods to eat during breakfast to help stabilize blood sugar levels and to prevent overeating at meals later in the day. Planning ahead is also a key component.  Cooking - Human resources officer conducted group or individual video education with verbal and written material and guidebook.  Patient learns eating strategies to improve overall health, including an approach to cook more at home. Recommendations include thinking of animal protein as a side on your plate rather than center stage and focusing instead on lower calorie dense options like vegetables, fruits, whole grains, and plant-based proteins, such as beans. Making sauces in large quantities to freeze for later and leaving the skin on your vegetables are also recommended to maximize your experience.  Cooking - Healthy Salads and Dressing Clinical staff conducted group or individual video education with verbal and written material and guidebook.  Patient learns that vegetables, fruits, whole grains, and legumes are the foundations of the [place]. Recommendations include how to incorporate each of these in flavorful and healthy salads, and how to create homemade salad dressings. Proper handling of ingredients is also covered. Cooking - Soups  and Desserts  Cooking - Soups and Desserts Clinical staff conducted group or individual video education with  verbal and written material and guidebook.  Patient learns that Pritikin soups and desserts make for easy, nutritious, and delicious snacks and meal components that are low in sodium, fat, sugar, and calorie density, while high in vitamins, minerals, and filling fiber. Recommendations include simple and healthy ideas for soups and desserts.   Overview     The Pritikin Solution Program Overview Clinical staff conducted group or individual video education with verbal and written material and guidebook.  Patient learns that the results of the [place] Program have been documented in more than 100 articles published in peer-reviewed journals, and the benefits include reducing risk factors for (and, in some cases, even reversing) high cholesterol, high blood pressure, type 2 diabetes, obesity, and more! An overview of the three key pillars of the Pritikin Program will be covered: eating well, doing regular exercise, and having a healthy mind-set.  WORKSHOPS  Exercise: Exercise Basics: Building Your Action Plan Clinical staff led group instruction and group discussion with PowerPoint presentation and patient guidebook. To enhance the learning environment the use of posters, models and videos may be added. At the conclusion of this workshop, patients will comprehend the difference between physical activity and exercise, as well as the benefits of incorporating both, into their routine. Patients will understand the FITT (Frequency, Intensity, Time, and Type) principle and how to use it to build an exercise action plan. In addition, safety concerns and other considerations for exercise and cardiac rehab will be addressed by the presenter. The purpose of this lesson is to promote a comprehensive and effective weekly exercise routine in order to improve patients' overall level of fitness.   Managing Heart Disease: Your Path to a Healthier Heart Clinical staff led group instruction and group discussion with  PowerPoint presentation and patient guidebook. To enhance the learning environment the use of posters, models and videos may be added.At the conclusion of this workshop, patients will understand the anatomy and physiology of the heart. Additionally, they will understand how Pritikin's three pillars impact the risk factors, the progression, and the management of heart disease.  The purpose of this lesson is to provide a high-level overview of the heart, heart disease, and how the Pritikin lifestyle positively impacts risk factors.  Exercise Biomechanics Clinical staff led group instruction and group discussion with PowerPoint presentation and patient guidebook. To enhance the learning environment the use of posters, models and videos may be added. Patients will learn how the structural parts of their bodies function and how these functions impact their daily activities, movement, and exercise. Patients will learn how to promote a neutral spine, learn how to manage pain, and identify ways to improve their physical movement in order to promote healthy living. The purpose of this lesson is to expose patients to common physical limitations that impact physical activity. Participants will learn practical ways to adapt and manage aches and pains, and to minimize their effect on regular exercise. Patients will learn how to maintain good posture while sitting, walking, and lifting.  Balance Training and Fall Prevention  Clinical staff led group instruction and group discussion with PowerPoint presentation and patient guidebook. To enhance the learning environment the use of posters, models and videos may be added. At the conclusion of this workshop, patients will understand the importance of their sensorimotor skills (vision, proprioception, and the vestibular system) in maintaining their ability to balance as they age. Patients will  apply a variety of balancing exercises that are appropriate for their  current level of function. Patients will understand the common causes for poor balance, possible solutions to these problems, and ways to modify their physical environment in order to minimize their fall risk. The purpose of this lesson is to teach patients about the importance of maintaining balance as they age and ways to minimize their risk of falling.  WORKSHOPS   Nutrition:  Fueling a Scientist, research (physical sciences) led group instruction and group discussion with PowerPoint presentation and patient guidebook. To enhance the learning environment the use of posters, models and videos may be added. Patients will review the foundational principles of the [place] and understand what constitutes a serving size in each of the food groups. Patients will also learn Pritikin-friendly foods that are better choices when away from home and review make-ahead meal and snack options. Calorie density will be reviewed and applied to three nutrition priorities: weight maintenance, weight loss, and weight gain. The purpose of this lesson is to reinforce (in a group setting) the key concepts around what patients are recommended to eat and how to apply these guidelines when away from home by planning and selecting Pritikin-friendly options. Patients will understand how calorie density may be adjusted for different weight management goals.  Mindful Eating  Clinical staff led group instruction and group discussion with PowerPoint presentation and patient guidebook. To enhance the learning environment the use of posters, models and videos may be added. Patients will briefly review the concepts of the [place] and the importance of low-calorie dense foods. The concept of mindful eating will be introduced as well as the importance of paying attention to internal hunger signals. Triggers for non-hunger eating and techniques for dealing with triggers will be explored. The purpose of this lesson is to provide  patients with the opportunity to review the basic principles of the [place], discuss the value of eating mindfully and how to measure internal cues of hunger and fullness using the Hunger Scale. Patients will also discuss reasons for non-hunger eating and learn strategies to use for controlling emotional eating.  Targeting Your Nutrition Priorities Clinical staff led group instruction and group discussion with PowerPoint presentation and patient guidebook. To enhance the learning environment the use of posters, models and videos may be added. Patients will learn how to determine their genetic susceptibility to disease by reviewing their family history. Patients will gain insight into the importance of diet as part of an overall healthy lifestyle in mitigating the impact of genetics and other environmental insults. The purpose of this lesson is to provide patients with the opportunity to assess their personal nutrition priorities by looking at their family history, their own health history and current risk factors. Patients will also be able to discuss ways of prioritizing and modifying the [place] for their highest risk areas  Menu  Clinical staff led group instruction and group discussion with PowerPoint presentation and patient guidebook. To enhance the learning environment the use of posters, models and videos may be added. Using menus brought in from [employer], or printed from [employer], patients will apply the [place] dining out guidelines that were presented in the [employer] video. Patients will also be able to practice these guidelines in a variety of provided scenarios. The purpose of this lesson is to provide patients with the opportunity to practice hands-on learning of the [place] with actual menus and  practice scenarios.  Label Reading Clinical staff led group instruction and group discussion with PowerPoint  presentation and patient guidebook. To enhance the learning environment the use of posters, models and videos may be added. Patients will review and discuss the Pritikin label reading guidelines presented in Pritikin's Label Reading Educational series video. Using fool labels brought in from local grocery stores and markets, patients will apply the label reading guidelines and determine if the packaged food meet the Pritikin guidelines. The purpose of this lesson is to provide patients with the opportunity to review, discuss, and practice hands-on learning of the Pritikin Label Reading guidelines with actual packaged food labels. [place] Workshops are designed to teach patients ways to prepare quick, simple, and affordable recipes at home. The importance of nutrition's role in chronic disease risk reduction is reflected in its emphasis in the overall Pritikin program. By learning how to prepare essential core Pritikin Eating Plan recipes, patients will increase control over what they eat; be able to customize the flavor of foods without the use of added salt, sugar, or fat; and improve the quality of the food they consume. By learning a set of core recipes which are easily assembled, quickly prepared, and affordable, patients are more likely to prepare more healthy foods at home. These workshops focus on convenient breakfasts, simple entres, side dishes, and desserts which can be prepared with minimal effort and are consistent with nutrition recommendations for cardiovascular risk reduction. Cooking [employer] are taught by a Engineer, materials (RD) who has been trained by the [employer]. The chef or RD has a clear understanding of the importance of minimizing - if not completely eliminating - added fat, sugar, and sodium in recipes. Throughout the series of [place] Workshop sessions, patients will learn about healthy ingredients and  efficient methods of cooking to build confidence in their capability to prepare    Cooking School weekly topics:  Adding Flavor- Sodium-Free  Fast and Healthy Breakfasts  Powerhouse Plant-Based Proteins  Satisfying Salads and Dressings  Simple Sides and Sauces  International Cuisine-Spotlight on the [place] Zones  Delicious Desserts  Savory Soups  Efficiency Cooking - Meals in a Snap  Tasty Appetizers and Snacks  Comforting Weekend Breakfasts  One-Pot Wonders   Fast Evening Meals  Easy [place] (Psychosocial): New Thoughts, New Behaviors Clinical staff led group instruction and group discussion with PowerPoint presentation and patient guidebook. To enhance the learning environment the use of posters, models and videos may be added. Patients will learn and practice techniques for developing effective health and lifestyle goals. Patients will be able to effectively apply the goal setting process learned to develop at least one new personal goal.  The purpose of this lesson is to expose patients to a new skill set of behavior modification techniques such as techniques setting SMART goals, overcoming barriers, and achieving new thoughts and new behaviors.  Managing Moods and Relationships Clinical staff led group instruction and group discussion with PowerPoint presentation and patient guidebook. To enhance the learning environment the use of posters, models and videos may be added. Patients will learn how emotional and chronic stress factors can impact their health and relationships. They will learn healthy ways to manage their moods and utilize positive coping mechanisms. In addition, ICR patients will learn ways to improve communication skills. The purpose of this lesson is to expose patients to ways of understanding how  one's mood and health are intimately connected. Developing a healthy outlook can help build positive  relationships and connections with others. Patients will understand the importance of utilizing effective communication skills that include actively listening and being heard. They will learn and understand the importance of the "4 Cs" and especially Connections in fostering of a Healthy Mind-Set.  Healthy Sleep for a Healthy Heart Clinical staff led group instruction and group discussion with PowerPoint presentation and patient guidebook. To enhance the learning environment the use of posters, models and videos may be added. At the conclusion of this workshop, patients will be able to demonstrate knowledge of the importance of sleep to overall health, well-being, and quality of life. They will understand the symptoms of, and treatments for, common sleep disorders. Patients will also be able to identify daytime and nighttime behaviors which impact sleep, and they will be able to apply these tools to help manage sleep-related challenges. The purpose of this lesson is to provide patients with a general overview of sleep and outline the importance of quality sleep. Patients will learn about a few of the most common sleep disorders. Patients will also be introduced to the concept of "sleep hygiene," and discover ways to self-manage certain sleeping problems through simple daily behavior changes. Finally, the workshop will motivate patients by clarifying the links between quality sleep and their goals of heart-healthy living.   Recognizing and Reducing Stress Clinical staff led group instruction and group discussion with PowerPoint presentation and patient guidebook. To enhance the learning environment the use of posters, models and videos may be added. At the conclusion of this workshop, patients will be able to understand the types of stress reactions, differentiate between acute and chronic stress, and recognize the impact that chronic stress has on their health. They will also be able to apply different coping  mechanisms, such as reframing negative self-talk. Patients will have the opportunity to practice a variety of stress management techniques, such as deep abdominal breathing, progressive muscle relaxation, and/or guided imagery.  The purpose of this lesson is to educate patients on the role of stress in their lives and to provide healthy techniques for coping with it.  Learning Barriers/Preferences:  Learning Barriers/Preferences - 07/08/22 1534       Learning Barriers/Preferences   Learning Barriers Sight   wears reading glasses   Learning Preferences Audio;Verbal Instruction;Video;Computer/Internet;Written Material;Group Instruction;Individual Instruction;Pictoral;Skilled Demonstration             Education Topics:  Knowledge Questionnaire Score:  Knowledge Questionnaire Score - 07/08/22 1535       Knowledge Questionnaire Score   Pre Score 23/24             Core Components/Risk Factors/Patient Goals at Admission:  Personal Goals and Risk Factors at Admission - 07/08/22 1535       Core Components/Risk Factors/Patient Goals on Admission    Weight Management Yes;Weight Loss    Intervention Weight Management: Develop a combined nutrition and exercise program designed to reach desired caloric intake, while maintaining appropriate intake of nutrient and fiber, sodium and fats, and appropriate energy expenditure required for the weight goal.;Weight Management: Provide education and appropriate resources to help participant work on and attain dietary goals.;Weight Management/Obesity: Establish reasonable short term and long term weight goals.    Admit Weight 186 lb 4.6 oz (84.5 kg)    Expected Outcomes Short Term: Continue to assess and modify interventions until short term weight is achieved;Long Term: Adherence to nutrition and physical activity/exercise program aimed  toward attainment of established weight goal;Weight Maintenance: Understanding of the daily nutrition guidelines,  which includes 25-35% calories from fat, 7% or less cal from saturated fats, less than 287m cholesterol, less than 1.5gm of sodium, & 5 or more servings of fruits and vegetables daily;Weight Loss: Understanding of general recommendations for a balanced deficit meal plan, which promotes 1-2 lb weight loss per week and includes a negative energy balance of [phone] kcal/d;Understanding recommendations for meals to include 15-35% energy as protein, 25-35% energy from fat, 35-60% energy from carbohydrates, less than 2039mof dietary cholesterol, 20-35 gm of total fiber daily;Understanding of distribution of calorie intake throughout the day with the consumption of 4-5 meals/snacks    Diabetes Yes    Intervention Provide education about signs/symptoms and action to take for hypo/hyperglycemia.;Provide education about proper nutrition, including hydration, and aerobic/resistive exercise prescription along with prescribed medications to achieve blood glucose in normal ranges: Fasting glucose 65-99 mg/dL    Expected Outcomes Short Term: Participant verbalizes understanding of the signs/symptoms and immediate care of hyper/hypoglycemia, proper foot care and importance of medication, aerobic/resistive exercise and nutrition plan for blood glucose control.;Long Term: Attainment of HbA1C < 7%.    Hypertension Yes    Intervention Provide education on lifestyle modifcations including regular physical activity/exercise, weight management, moderate sodium restriction and increased consumption of fresh fruit, vegetables, and low fat dairy, alcohol moderation, and smoking cessation.;Monitor prescription use compliance.    Expected Outcomes Short Term: Continued assessment and intervention until BP is < 140/9037mG in hypertensive participants. < 130/46m69m in hypertensive participants with diabetes, heart failure or chronic kidney disease.;Long Term: Maintenance of blood pressure at goal levels.    Lipids Yes     Intervention Provide education and support for participant on nutrition & aerobic/resistive exercise along with prescribed medications to achieve LDL <70mg2mL >40mg.39mExpected Outcomes Short Term: Participant states understanding of desired cholesterol values and is compliant with medications prescribed. Participant is following exercise prescription and nutrition guidelines.;Long Term: Cholesterol controlled with medications as prescribed, with individualized exercise RX and with personalized nutrition plan. Value goals: LDL < 70mg, 78m> 40 mg.             Core Components/Risk Factors/Patient Goals Review:    Core Components/Risk Factors/Patient Goals at Discharge (Final Review):    ITP Comments:  ITP Comments     Row Name 07/08/22 1539           ITP Comments Dr Traci TFransico Himdical Director, Introduction to [facility]/ Intensive Cardiac Rehab. Initial orientation packet Reviewed with the patient.                Comments: Participant attended orientation for the cardiac rehabilitation program on  07/08/2022  to perform initial intake and exercise walk test. Patient introduced to the [facility] and orientation packet was reviewed. Completed 6-minute walk test, measurements, initial ITP, and exercise prescription. Vital signs stable. Telemetry-normal sinus rhythm, asymptomatic.Elsy Chiang WHarrell Gave   Service time was from 1318 to [phone]

## 2022-07-10 NOTE — Telephone Encounter (Signed)
Thanks -- I felt like it was a bit "iffy".  Bad apples in every batch.  [place]

## 2022-07-16 ENCOUNTER — Encounter (HOSPITAL_COMMUNITY)
Admission: RE | Admit: 2022-07-16 | Discharge: 2022-07-16 | Disposition: A | Discharge status: Home / Self Care | Payer: BC Managed Care – PPO | Source: Ambulatory Visit | Attending: Cardiology | Admitting: Cardiology

## 2022-07-16 DIAGNOSIS — Z951 Presence of aortocoronary bypass graft: Secondary | ICD-10-CM | POA: Diagnosis present

## 2022-07-16 DIAGNOSIS — I214 Non-ST elevation (NSTEMI) myocardial infarction: Secondary | ICD-10-CM

## 2022-07-16 LAB — GLUCOSE, CAPILLARY
Glucose-Capillary: 113 mg/dL — ABNORMAL HIGH (ref 70–99)
Glucose-Capillary: 89 mg/dL (ref 70–99)

## 2022-07-16 NOTE — Progress Notes (Signed)
Daily Session Note  Patient Details  Name: Dustin Wood MRN: [id number] Date of Birth: [birth year] Referring Provider:   [place] from 07/08/2022 in [facility] [place]  Referring Provider Glenetta Hew, MD       Encounter Date: 07/16/2022  Check In:  Session Check In - 07/16/22 1455       Check-In   Supervising physician immediately available to respond to emergencies [facility] - Physician supervision    Physician(s) Dr. Julianne Handler    Location [facility]    Staff Present Barnet Pall, RN, Milus Glazier, MS, ACSM-CEP, CCRP, Exercise Physiologist;Bailey Pearline Cables, MS, Exercise Physiologist;Jetta Gilford Rile BS, ACSM-CEP, Exercise Physiologist;Olinty Celesta Aver, MS, ACSM-CEP, Exercise Physiologist    Virtual Visit No    Medication changes reported     No    Fall or balance concerns reported    No    Tobacco Cessation No Change    Current number of cigarettes/nicotine per day     0    Warm-up and Cool-down Performed as group-led instruction    Resistance Training Performed No    VAD Patient? No    PAD/SET Patient? No      Pain Assessment   Currently in Pain? No/denies    Pain Score 0-No pain    Multiple Pain Sites No             Capillary Blood Glucose: Results for orders placed or performed during the hospital encounter of 07/16/22 (from the past 24 hour(s))  Glucose, capillary     Status: None   Collection Time: 07/16/22  2:59 PM  Result Value Ref Range   Glucose-Capillary 89 70 - 99 mg/dL  Glucose, capillary     Status: Abnormal   Collection Time: 07/16/22  3:25 PM  Result Value Ref Range   Glucose-Capillary 113 (H) 70 - 99 mg/dL     Exercise Prescription Changes - 07/16/22 1600       Response to Exercise   Blood Pressure (Admit) 112/92    Blood Pressure (Exercise) 122/78    Blood Pressure (Exit) 102/64    Heart Rate (Admit) 91 bpm    Heart Rate (Exercise) 110  bpm    Heart Rate (Exit) 100 bpm    Rating of Perceived Exertion (Exercise) 11.5    Perceived Dyspnea (Exercise) 0    Symptoms none    Comments Pt first day in the [facility] program    Duration Progress to 30 minutes of  aerobic without signs/symptoms of physical distress    Intensity THRR unchanged      Progression   Progression Continue to progress workloads to maintain intensity without signs/symptoms of physical distress.    Average METs 2.55      Resistance Training   Training Prescription No      Recumbant Bike   Level 2    RPM 63    Watts 25    Minutes 15    METs 3      Arm Ergometer   Level 2    Watts 13    RPM 45    Minutes 15    METs 2.1             Social History   Tobacco Use  Smoking Status Never  Smokeless Tobacco Never    Goals Met:  Exercise tolerated well No report of concerns or symptoms today  Goals Unmet:  Not Applicable  Comments: Pt started  cardiac rehab today.  Pt tolerated light exercise without difficulty. VSS, telemetry-Sinus Rhythm, asymptomatic.  Medication list reconciled. Pt denies barriers to medicaiton compliance.  PSYCHOSOCIAL ASSESSMENT:  PHQ-0. Pt exhibits positive coping skills, hopeful outlook with supportive family. No psychosocial needs identified at this time, no psychosocial interventions necessary.    Pt oriented to exercise equipment and routine.    Understanding verbalized.Harrell Gave RN BSN    Dr. Fransico Him is Medical Director for Cardiac Rehab at [facility].

## 2022-07-18 ENCOUNTER — Encounter (HOSPITAL_COMMUNITY): Payer: BC Managed Care – PPO

## 2022-07-22 NOTE — Progress Notes (Signed)
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 Swallow whole., Disp: 30 tablet, Rfl: 12   blood glucose meter kit and supplies KIT, Dispense based on patient and insurance preference. Use up to four times daily as directed., Disp: 1 each, Rfl: 0   Cholecalciferol (VITAMIN D3) 50 MCG (2000 UT) TABS, Take 2,000 Units by mouth in the morning., Disp: , Rfl:    clopidogrel (PLAVIX) 75 MG tablet, Take 1 tablet (75 mg total) by mouth daily., Disp: 90 tablet, Rfl: 3   empagliflozin (JARDIANCE) 25 MG TABS tablet, Take 1 tablet (25 mg total) by mouth daily before breakfast., Disp: 90 tablet, Rfl: 3   fluorouracil (EFUDEX) 5 % cream, APPLY TO AFFECTED AREA(S) AT BEDTIME AS DIRECTED (Patient taking differently: Apply 1 Application topically daily as needed (for rash).), Disp: 40 g, Rfl: 1   metFORMIN (GLUCOPHAGE) 500 MG tablet, Take 500 mg by mouth 2 (two) times daily., Disp: , Rfl:     metoprolol tartrate (LOPRESSOR) 25 MG tablet, Take 0.5 tablets (12.5 mg total) by mouth 2 (two) times daily., Disp: 90 tablet, Rfl: 3   Multiple Vitamin (MULITIVITAMIN WITH MINERALS) TABS, Take 1 tablet by mouth daily., Disp: , Rfl:    rosuvastatin (CRESTOR) 40 MG tablet, Take 1 tablet (40 mg total) by mouth daily. (Patient taking differently: Take 20 mg by mouth every evening.), Disp: 90 tablet, Rfl: 3   Saw Palmetto, Serenoa repens, 540 MG CAPS, Take 540 mg by mouth in the morning., Disp: , Rfl:    Turmeric (QC TUMERIC COMPLEX) 500 MG CAPS, Take 1 capsule by mouth in the morning., Disp: , Rfl:    VASCEPA 1 g capsule, Take 2 capsules (2 g total) by mouth 2 (two) times daily., Disp: 360 capsule, Rfl: 3   zinc gluconate 50 MG tablet, Take 50 mg by mouth daily., Disp: , Rfl:   Past Medical History: Past Medical History:  Diagnosis Date   Coronary artery disease    Diabetes mellitus without complication ([place])    Hyperlipidemia    Melanoma ([place]) 04/07/2019   right fore arm MIS TX EXC   Myocardial infarction ([place])    SCC (squamous cell carcinoma) 06/30/2016   RIGHT DORSAL HAND CX3 5FU   SCC (squamous cell carcinoma) 06/30/2016   LEFT HAND TX CX3 5FU   Squamous cell carcinoma of skin 12/2015   RIGHT TEMPLE CX3  5FU    Tobacco Use: Social History   Tobacco Use  Smoking Status Never  Smokeless Tobacco Never    Labs: Review Flowsheet  More data may exist      Latest Ref Rng & Units 08/06/2017 06/22/2020 04/14/2022 04/17/2022 04/18/2022  Labs for ITP Cardiac and Pulmonary Rehab  Cholestrol 0 - 200 mg/dL 182  157  173  - -  LDL (calc) 0 - 99 mg/dL 125  95  77  - -  HDL-C >40 mg/dL 32  29  26  - -  Trlycerides <150 mg/dL 135  240  349  - -  Hemoglobin A1c 4.8 - 5.6 % - - 7.5  - -  PH, Arterial 7.35 - 7.45 - - - 7.383  7.364  7.348  7.222  7.284  7.261  7.331  7.409  7.306  7.39  7.385   PCO2 arterial 32 - 48 mmHg - - - 36.8  36.9  43.0  43.3  43.7  43.1  44.8  37.4  52.9  37  38.0    Bicarbonate 20.0 - 28.0 mmol/L - - - 21.9  21.1  23.6  17.9  20.7  19.4  23.7  23.1  23.7  26.4  22.4  22.7   TCO2 22 - 32 mmol/L - - - _0 Acid-base deficit 0.0 - 2.0 mmol/L - - - 3.0  4.0  2.0  10.0  6.0  7.0  2.0  3.0  1.0  1.0  2.2  2.0   O2 Saturation % - - - 92  91  92  92  90  97  100  75  100  100  96.6  96     Capillary Blood Glucose: Lab Results  Component Value Date   GLUCAP 113 (H) 07/16/2022   GLUCAP 89 07/16/2022   GLUCAP 120 (H) 04/21/2022   GLUCAP 153 (H) 04/21/2022   GLUCAP 158 (H) 04/20/2022     Exercise Target Goals: Exercise Program Goal: Individual exercise prescription set using results from initial 6 min walk test and THRR while considering  patient's activity barriers and safety.   Exercise Prescription Goal: Initial exercise prescription builds to 30-45 minutes a day of aerobic activity, 2-3 days per week.  Home exercise guidelines will be given to patient during program as part of exercise prescription that the participant will acknowledge.  Activity Barriers & Risk Stratification:  Activity Barriers & Cardiac Risk Stratification - 07/08/22 1537       Activity Barriers & Cardiac Risk Stratification   Activity Barriers Joint Problems;Muscular Weakness;Deconditioning    Cardiac Risk Stratification High             6 Minute Walk:  6 Minute Walk     Row Name 07/08/22 1410         6 Minute Walk   Phase Initial     Distance 1400 feet     Walk Time 6 minutes     # of Rest Breaks 0     MPH 2.7     METS 3.3     RPE 8     Perceived Dyspnea  0     VO2 Peak 11.5     Symptoms No     Resting HR 69 bpm     Resting BP 112/68  Resting Oxygen Saturation  96 %     Exercise Oxygen Saturation  during 6 min walk 98 %     Max Ex. HR 92 bpm     Max Ex. BP 120/66     2 Minute Post BP 110/60              Oxygen Initial Assessment:   Oxygen Re-Evaluation:   Oxygen Discharge  (Final Oxygen Re-Evaluation):   Initial Exercise Prescription:  Initial Exercise Prescription - 07/08/22 1500       Date of Initial Exercise RX and Referring Provider   Date 07/08/22    Referring Provider Glenetta Hew, MD    Expected Discharge Date 09/05/22      Recumbant Bike   Level 2    RPM 60    Watts 25    Minutes 15    METs 3.3      Arm Ergometer   Level 2    Watts 25    RPM 60    Minutes 15    METs 3.3      Prescription Details   Frequency (times per week) 3    Duration Progress to 30 minutes of continuous aerobic without signs/symptoms of physical distress      Intensity   THRR 40-80% of Max Heartrate 62-125    Ratings of Perceived Exertion 11-13    Perceived Dyspnea 0-4      Progression   Progression Continue progressive overload as per policy without signs/symptoms or physical distress.      Resistance Training   Training Prescription Yes    Weight 4 lbs    Reps 10-15             Perform Capillary Blood Glucose checks as needed.  Exercise Prescription Changes:   Exercise Prescription Changes     Row Name 07/16/22 1600             Response to Exercise   Blood Pressure (Admit) 112/92       Blood Pressure (Exercise) 122/78       Blood Pressure (Exit) 102/64       Heart Rate (Admit) 91 bpm       Heart Rate (Exercise) 110 bpm       Heart Rate (Exit) 100 bpm       Rating of Perceived Exertion (Exercise) 11.5       Perceived Dyspnea (Exercise) 0       Symptoms none       Comments Pt first day in the CRP2 program       Duration Progress to 30 minutes of  aerobic without signs/symptoms of physical distress       Intensity THRR unchanged         Progression   Progression Continue to progress workloads to maintain intensity without signs/symptoms of physical distress.       Average METs 2.55         Resistance Training   Training Prescription No         Recumbant Bike   Level 2       RPM 63       Watts 25       Minutes 15        METs 3         Arm Ergometer   Level 2       Watts 13       RPM 45       Minutes 15  METs 2.1                Exercise Comments:   Exercise Comments     Row Name 07/16/22 1632           Exercise Comments Pt first day in the CRP2 program. Pt tolerated exercise well with an average MET level of 2.55. Pt is learning his THRR, RPE and ExRx. Will continue to monitor pt and progress workloads as tolerated without sign or symptom                Exercise Goals and Review:   Exercise Goals     Row Name 07/08/22 1538             Exercise Goals   Increase Physical Activity Yes       Intervention Provide advice, education, support and counseling about physical activity/exercise needs.;Develop an individualized exercise prescription for aerobic and resistive training based on initial evaluation findings, risk stratification, comorbidities and participant's personal goals.       Expected Outcomes Short Term: Attend rehab on a regular basis to increase amount of physical activity.;Long Term: Add in home exercise to make exercise part of routine and to increase amount of physical activity.;Long Term: Exercising regularly at least 3-5 days a week.       Increase Strength and Stamina Yes       Intervention Provide advice, education, support and counseling about physical activity/exercise needs.;Develop an individualized exercise prescription for aerobic and resistive training based on initial evaluation findings, risk stratification, comorbidities and participant's personal goals.       Expected Outcomes Short Term: Increase workloads from initial exercise prescription for resistance, speed, and METs.;Short Term: Perform resistance training exercises routinely during rehab and add in resistance training at home;Long Term: Improve cardiorespiratory fitness, muscular endurance and strength as measured by increased METs and functional capacity (6MWT)       Able to understand and use  rate of perceived exertion (RPE) scale Yes       Intervention Provide education and explanation on how to use RPE scale       Expected Outcomes Short Term: Able to use RPE daily in rehab to express subjective intensity level;Long Term:  Able to use RPE to guide intensity level when exercising independently       Knowledge and understanding of Target Heart Rate Range (THRR) Yes       Intervention Provide education and explanation of THRR including how the numbers were predicted and where they are located for reference       Expected Outcomes Short Term: Able to state/look up THRR;Short Term: Able to use daily as guideline for intensity in rehab;Long Term: Able to use THRR to govern intensity when exercising independently       Understanding of Exercise Prescription Yes       Intervention Provide education, explanation, and written materials on patient's individual exercise prescription       Expected Outcomes Short Term: Able to explain program exercise prescription;Long Term: Able to explain home exercise prescription to exercise independently                Exercise Goals Re-Evaluation :  Exercise Goals Re-Evaluation     [place] Name 07/16/22 1631             Exercise Goal Re-Evaluation   Exercise Goals Review Increase Physical Activity;Understanding of Exercise Prescription;Increase Strength and Stamina;Able to understand and use rate of perceived exertion (RPE) scale;Knowledge and understanding  of Target Heart Rate Range (THRR)       Comments Pt first day in the CRP2 program. Pt tolerated exercise well with an average MET level of 2.55. Pt is learning his THRR, RPE and ExRx.       Expected Outcomes Will continue to monitor pt and progress workloads as tolerated without sign or symptom                Discharge Exercise Prescription (Final Exercise Prescription Changes):  Exercise Prescription Changes - 07/16/22 1600       Response to Exercise   Blood Pressure (Admit) 112/92     Blood Pressure (Exercise) 122/78    Blood Pressure (Exit) 102/64    Heart Rate (Admit) 91 bpm    Heart Rate (Exercise) 110 bpm    Heart Rate (Exit) 100 bpm    Rating of Perceived Exertion (Exercise) 11.5    Perceived Dyspnea (Exercise) 0    Symptoms none    Comments Pt first day in the CRP2 program    Duration Progress to 30 minutes of  aerobic without signs/symptoms of physical distress    Intensity THRR unchanged      Progression   Progression Continue to progress workloads to maintain intensity without signs/symptoms of physical distress.    Average METs 2.55      Resistance Training   Training Prescription No      Recumbant Bike   Level 2    RPM 63    Watts 25    Minutes 15    METs 3      Arm Ergometer   Level 2    Watts 13    RPM 45    Minutes 15    METs 2.1             Nutrition:  Target Goals: Understanding of nutrition guidelines, daily intake of sodium <1564m, cholesterol <2069m calories 30% from fat and 7% or less from saturated fats, daily to have 5 or more servings of fruits and vegetables.  Biometrics:  Pre Biometrics - 07/08/22 1328       Pre Biometrics   Waist Circumference 39.25 inches    Hip Circumference 39.75 inches    Waist to Hip Ratio 0.99 %    Triceps Skinfold 7 mm    % Body Fat 23.8 %    Grip Strength 30 kg    Flexibility 11 in    Single Leg Stand 30 seconds              Nutrition Therapy Plan and Nutrition Goals:   Nutrition Assessments:  Nutrition Assessments - 07/10/22 1025       Rate Your Plate Scores   Pre Score 75            MEDIFICTS Score Key: ?70 Need to make dietary changes  40-70 Heart Healthy Diet ? 40 Therapeutic Level Cholesterol Diet   Flowsheet Row INTENSIVE CARDIAC REHAB OR[facility]  Picture Your Plate Total Score on Admission 75      Picture Your Plate Scores: <4<91nhealthy dietary pattern with much  room for improvement. 41-50 Dietary pattern unlikely to meet recommendations for good health and room for improvement. 51-60 More healthful dietary pattern, with some room for improvement.  >60 Healthy dietary pattern, although there may be some specific behaviors that could be improved.    Nutrition Goals Re-Evaluation:   Nutrition Goals Re-Evaluation:  Nutrition Goals Discharge (Final Nutrition Goals Re-Evaluation):   Psychosocial: Target Goals: Acknowledge presence or absence of significant depression and/or stress, maximize coping skills, provide positive support system. Participant is able to verbalize types and ability to use techniques and skills needed for reducing stress and depression.  Initial Review & Psychosocial Screening:  Initial Psych Review & Screening - 07/08/22 1542       Initial Review   Current issues with None Identified      Family Dynamics   Good Support System? Yes   Gerald Stabs has his wife for support     Barriers   Psychosocial barriers to participate in program There are no identifiable barriers or psychosocial needs.      Screening Interventions   Interventions Encouraged to exercise             Quality of Life Scores:  Quality of Life - 07/08/22 1534       Quality of Life   Select Quality of Life      Quality of Life Scores   Health/Function Pre 21.8 %    Socioeconomic Pre 19.86 %    Psych/Spiritual Pre 20.21 %    Family Pre 22.3 %    GLOBAL Pre 21.15 %            Scores of 19 and below usually indicate a poorer quality of life in these areas.  A difference of  2-3 points is a clinically meaningful difference.  A difference of 2-3 points in the total score of the Quality of Life Index has been associated with significant improvement in overall quality of life, self-image, physical symptoms, and general health in studies assessing change in quality of life.  PHQ-9: Review Flowsheet  More data exists      07/08/2022 06/30/2022  04/22/2022 06/29/2020 08/11/2017  Depression screen PHQ 2/9  Decreased Interest 0 0 0 0 0  Down, Depressed, Hopeless 0 0 0 0 0  PHQ - 2 Score 0 0 0 0 0  Altered sleeping - - - 0 -  Tired, decreased energy - - - 0 -  Change in appetite - - - 0 -  Feeling bad or failure about yourself  - - - 0 -  Trouble concentrating - - - 0 -  Moving slowly or fidgety/restless - - - 0 -  Suicidal thoughts - - - 0 -  PHQ-9 Score - - - 0 -  Difficult doing work/chores - - - Not difficult at all -   Interpretation of Total Score  Total Score Depression Severity:  1-4 = Minimal depression, 5-9 = Mild depression, 10-14 = Moderate depression, 15-19 = Moderately severe depression, 20-27 = Severe depression   Psychosocial Evaluation and Intervention:   Psychosocial Re-Evaluation:  Psychosocial Re-Evaluation     Row Name 07/18/22 0941             Psychosocial Re-Evaluation   Current issues with None Identified       Interventions Encouraged to attend Cardiac Rehabilitation for the exercise       Continue Psychosocial Services  No Follow up required                Psychosocial Discharge (Final Psychosocial Re-Evaluation):  Psychosocial Re-Evaluation - 07/18/22 0941       Psychosocial Re-Evaluation   Current issues with None Identified    Interventions Encouraged to attend Cardiac Rehabilitation for the exercise    Continue Psychosocial Services  No Follow up required  Vocational Rehabilitation: Provide vocational rehab assistance to qualifying candidates.   Vocational Rehab Evaluation & Intervention:  Vocational Rehab - 07/08/22 1543       Initial Vocational Rehab Evaluation & Intervention   Assessment shows need for Vocational Rehabilitation No   Gerald Stabs works full time at [employer] and does not need vocational rehab at this time            Education: Education Goals: Education classes will be provided on a weekly basis, covering required  topics. Participant will state understanding/return demonstration of topics presented.    Education     Row Name 07/16/22 1600     Education   Cardiac Education Topics Pritikin   Charity fundraiser Exercise Physiologist   Select Nutrition   Nutrition Cooking - Healthy Salads and Dressing   Instruction Review Code 1- Verbalizes Understanding   Class Start Time 1358   Class Stop Time 1442   Class Time Calculation (min) 44 min            Core Videos: Exercise    Move It!  Clinical staff conducted group or individual video education with verbal and written material and guidebook.  Patient learns the recommended Pritikin exercise program. Exercise with the goal of living a long, healthy life. Some of the health benefits of exercise include controlled diabetes, healthier blood pressure levels, improved cholesterol levels, improved heart and lung capacity, improved sleep, and better body composition. Everyone should speak with their doctor before starting or changing an exercise routine.  Biomechanical Limitations Clinical staff conducted group or individual video education with verbal and written material and guidebook.  Patient learns how biomechanical limitations can impact exercise and how we can mitigate and possibly overcome limitations to have an impactful and balanced exercise routine.  Body Composition Clinical staff conducted group or individual video education with verbal and written material and guidebook.  Patient learns that body composition (ratio of muscle mass to fat mass) is a key component to assessing overall fitness, rather than body weight alone. Increased fat mass, especially visceral belly fat, can put [place] at increased risk for metabolic syndrome, type 2 diabetes, heart disease, and even death. It is recommended to combine diet and exercise (cardiovascular and resistance training) to improve your body composition. Seek guidance from your  physician and exercise physiologist before implementing an exercise routine.  Exercise Action Plan Clinical staff conducted group or individual video education with verbal and written material and guidebook.  Patient learns the recommended strategies to achieve and enjoy long-term exercise adherence, including variety, self-motivation, self-efficacy, and positive decision making. Benefits of exercise include fitness, good health, weight management, more energy, better sleep, less stress, and overall well-being.  Medical   Heart Disease Risk Reduction Clinical staff conducted group or individual video education with verbal and written material and guidebook.  Patient learns our heart is our most vital organ as it circulates oxygen, nutrients, white blood cells, and hormones throughout the entire body, and carries waste away. Data supports a plant-based eating plan like the Pritikin Program for its effectiveness in slowing progression of and reversing heart disease. The video provides a number of recommendations to address heart disease.   Metabolic Syndrome and Belly Fat  Clinical staff conducted group or individual video education with verbal and written material and guidebook.  Patient learns what metabolic syndrome is, how it leads to heart disease, and how one can reverse it and keep it  from coming back. You have metabolic syndrome if you have 3 of the following 5 criteria: abdominal obesity, high blood pressure, high triglycerides, low HDL cholesterol, and high blood sugar.  Hypertension and Heart Disease Clinical staff conducted group or individual video education with verbal and written material and guidebook.  Patient learns that high blood pressure, or hypertension, is very common in the [place]. Hypertension is largely due to excessive salt intake, but other important risk factors include being overweight, physical inactivity, drinking too much alcohol, smoking, and not eating enough  potassium from fruits and vegetables. High blood pressure is a leading risk factor for heart attack, stroke, congestive heart failure, dementia, kidney failure, and premature death. Long-term effects of excessive salt intake include stiffening of the arteries and thickening of heart muscle and organ damage. Recommendations include ways to reduce hypertension and the risk of heart disease.  Diseases of Our Time - Focusing on Diabetes Clinical staff conducted group or individual video education with verbal and written material and guidebook.  Patient learns why the best way to stop diseases of our time is prevention, through food and other lifestyle changes. Medicine (such as prescription pills and surgeries) is often only a Band-Aid on the problem, not a long-term solution. Most common diseases of our time include obesity, type 2 diabetes, hypertension, heart disease, and cancer. The Pritikin Program is recommended and has been proven to help reduce, reverse, and/or prevent the damaging effects of metabolic syndrome.  Nutrition   Overview of the Pritikin Eating Plan  Clinical staff conducted group or individual video education with verbal and written material and guidebook.  Patient learns about the [place] for disease risk reduction. The [place] emphasizes a wide variety of unrefined, minimally-processed carbohydrates, like fruits, vegetables, whole grains, and legumes. Go, Caution, and Stop food choices are explained. Plant-based and lean animal proteins are emphasized. Rationale provided for low sodium intake for blood pressure control, low added sugars for blood sugar stabilization, and low added fats and oils for coronary artery disease risk reduction and weight management.  Calorie Density  Clinical staff conducted group or individual video education with verbal and written material and guidebook.  Patient learns about calorie density and how it impacts the Pritikin Eating  Plan. Knowing the characteristics of the food you choose will help you decide whether those foods will lead to weight gain or weight loss, and whether you want to consume more or less of them. Weight loss is usually a side effect of the Pritikin Eating Plan because of its focus on low calorie-dense foods.  Label Reading  Clinical staff conducted group or individual video education with verbal and written material and guidebook.  Patient learns about the Pritikin recommended label reading guidelines and corresponding recommendations regarding calorie density, added sugars, sodium content, and whole grains.  Dining Out - Part 1  Clinical staff conducted group or individual video education with verbal and written material and guidebook.  Patient learns that restaurant meals can be sabotaging because they can be so high in calories, fat, sodium, and/or sugar. Patient learns recommended strategies on how to positively address this and avoid unhealthy pitfalls.  Facts on Fats  Clinical staff conducted group or individual video education with verbal and written material and guidebook.  Patient learns that lifestyle modifications can be just as effective, if not more so, as many medications for lowering your risk of heart disease. A Pritikin lifestyle can help to reduce your risk of inflammation and  atherosclerosis (cholesterol build-up, or plaque, in the artery walls). Lifestyle interventions such as dietary choices and physical activity address the cause of atherosclerosis. A review of the types of fats and their impact on blood cholesterol levels, along with dietary recommendations to reduce fat intake is also included.  Nutrition Action Plan  Clinical staff conducted group or individual video education with verbal and written material and guidebook.  Patient learns how to incorporate Pritikin recommendations into their lifestyle. Recommendations include planning and keeping personal health goals in mind  as an important part of their success.  Healthy Mind-Set    Healthy Minds, Bodies, Hearts  Clinical staff conducted group or individual video education with verbal and written material and guidebook.  Patient learns how to identify when they are stressed. Video will discuss the impact of that stress, as well as the many benefits of stress management. Patient will also be introduced to stress management techniques. The way we think, act, and feel has an impact on our hearts.  How Our Thoughts Can Heal Our Hearts  Clinical staff conducted group or individual video education with verbal and written material and guidebook.  Patient learns that negative thoughts can cause depression and anxiety. This can result in negative lifestyle behavior and serious health problems. Cognitive behavioral therapy is an effective method to help control our thoughts in order to change and improve our emotional outlook.  Additional Videos:  Exercise    Improving Performance  Clinical staff conducted group or individual video education with verbal and written material and guidebook.  Patient learns to use a non-linear approach by alternating intensity levels and lengths of time spent exercising to help burn more calories and lose more body fat. Cardiovascular exercise helps improve heart health, metabolism, hormonal balance, blood sugar control, and recovery from fatigue. Resistance training improves strength, endurance, balance, coordination, reaction time, metabolism, and muscle mass. Flexibility exercise improves circulation, posture, and balance. Seek guidance from your physician and exercise physiologist before implementing an exercise routine and learn your capabilities and proper form for all exercise.  Introduction to Yoga  Clinical staff conducted group or individual video education with verbal and written material and guidebook.  Patient learns about yoga, a discipline of the coming together of mind, breath,  and body. The benefits of yoga include improved flexibility, improved range of motion, better posture and core strength, increased lung function, weight loss, and positive self-image. Yoga's heart health benefits include lowered blood pressure, healthier heart rate, decreased cholesterol and triglyceride levels, improved immune function, and reduced stress. Seek guidance from your physician and exercise physiologist before implementing an exercise routine and learn your capabilities and proper form for all exercise.  Medical   Aging: Enhancing Your Quality of Life  Clinical staff conducted group or individual video education with verbal and written material and guidebook.  Patient learns key strategies and recommendations to stay in good physical health and enhance quality of life, such as prevention strategies, having an advocate, securing a [place], and keeping a list of medications and system for tracking them. It also discusses how to avoid risk for bone loss.  Biology of Weight Control  Clinical staff conducted group or individual video education with verbal and written material and guidebook.  Patient learns that weight gain occurs because we consume more calories than we burn (eating more, moving less). Even if your body weight is normal, you may have higher ratios of fat compared to muscle mass. Too much body  fat puts you at increased risk for cardiovascular disease, heart attack, stroke, type 2 diabetes, and obesity-related cancers. In addition to exercise, following the [place] can help reduce your risk.  Decoding Lab Results  Clinical staff conducted group or individual video education with verbal and written material and guidebook.  Patient learns that lab test reflects one measurement whose values change over time and are influenced by many factors, including medication, stress, sleep, exercise, food, hydration, pre-existing medical conditions,  and more. It is recommended to use the knowledge from this video to become more involved with your lab results and evaluate your numbers to speak with your doctor.   Diseases of Our Time - Overview  Clinical staff conducted group or individual video education with verbal and written material and guidebook.  Patient learns that according to the CDC, 50% to 70% of chronic diseases (such as obesity, type 2 diabetes, elevated lipids, hypertension, and heart disease) are avoidable through lifestyle improvements including healthier food choices, listening to satiety cues, and increased physical activity.  Sleep Disorders Clinical staff conducted group or individual video education with verbal and written material and guidebook.  Patient learns how good quality and duration of sleep are important to overall health and well-being. Patient also learns about sleep disorders and how they impact health along with recommendations to address them, including discussing with a physician.  Nutrition  Dining Out - Part 2 Clinical staff conducted group or individual video education with verbal and written material and guidebook.  Patient learns how to plan ahead and communicate in order to maximize their dining experience in a healthy and nutritious manner. Included are recommended food choices based on the type of restaurant the patient is visiting.   Fueling a Best boy conducted group or individual video education with verbal and written material and guidebook.  There is a strong connection between our food choices and our health. Diseases like obesity and type 2 diabetes are very prevalent and are in large-part due to lifestyle choices. The Pritikin Eating Plan provides plenty of food and hunger-curbing satisfaction. It is easy to follow, affordable, and helps reduce health risks.  Menu Workshop  Clinical staff conducted group or individual video education with verbal and written material  and guidebook.  Patient learns that restaurant meals can sabotage health goals because they are often packed with calories, fat, sodium, and sugar. Recommendations include strategies to plan ahead and to communicate with the manager, chef, or server to help order a healthier meal.  Planning Your Eating Strategy  Clinical staff conducted group or individual video education with verbal and written material and guidebook.  Patient learns about the [place] and its benefit of reducing the risk of disease. The [place] does not focus on calories. Instead, it emphasizes high-quality, nutrient-rich foods. By knowing the characteristics of the foods, we choose, we can determine their calorie density and make informed decisions.  Targeting Your Nutrition Priorities  Clinical staff conducted group or individual video education with verbal and written material and guidebook.  Patient learns that lifestyle habits have a tremendous impact on disease risk and progression. This video provides eating and physical activity recommendations based on your personal health goals, such as reducing LDL cholesterol, losing weight, preventing or controlling type 2 diabetes, and reducing high blood pressure.  Vitamins and Minerals  Clinical staff conducted group or individual video education with verbal and written material and guidebook.  Patient learns different ways to obtain  key vitamins and minerals, including through a recommended healthy diet. It is important to discuss all supplements you take with your doctor.   Healthy Mind-Set    Smoking Cessation  Clinical staff conducted group or individual video education with verbal and written material and guidebook.  Patient learns that cigarette smoking and tobacco addiction pose a serious health risk which affects millions of people. Stopping smoking will significantly reduce the risk of heart disease, lung disease, and many forms of cancer.  Recommended strategies for quitting are covered, including working with your doctor to develop a successful plan.  Culinary   Becoming a Financial trader conducted group or individual video education with verbal and written material and guidebook.  Patient learns that cooking at home can be healthy, cost-effective, quick, and puts them in control. Keys to cooking healthy recipes will include looking at your recipe, assessing your equipment needs, planning ahead, making it simple, choosing cost-effective seasonal ingredients, and limiting the use of added fats, salts, and sugars.  Cooking - Breakfast and Snacks  Clinical staff conducted group or individual video education with verbal and written material and guidebook.  Patient learns how important breakfast is to satiety and nutrition through the entire day. Recommendations include key foods to eat during breakfast to help stabilize blood sugar levels and to prevent overeating at meals later in the day. Planning ahead is also a key component.  Cooking - Human resources officer conducted group or individual video education with verbal and written material and guidebook.  Patient learns eating strategies to improve overall health, including an approach to cook more at home. Recommendations include thinking of animal protein as a side on your plate rather than center stage and focusing instead on lower calorie dense options like vegetables, fruits, whole grains, and plant-based proteins, such as beans. Making sauces in large quantities to freeze for later and leaving the skin on your vegetables are also recommended to maximize your experience.  Cooking - Healthy Salads and Dressing Clinical staff conducted group or individual video education with verbal and written material and guidebook.  Patient learns that vegetables, fruits, whole grains, and legumes are the foundations of the [place]. Recommendations include how  to incorporate each of these in flavorful and healthy salads, and how to create homemade salad dressings. Proper handling of ingredients is also covered. Cooking - Soups and [employer] - Soups and Desserts Clinical staff conducted group or individual video education with verbal and written material and guidebook.  Patient learns that Pritikin soups and desserts make for easy, nutritious, and delicious snacks and meal components that are low in sodium, fat, sugar, and calorie density, while high in vitamins, minerals, and filling fiber. Recommendations include simple and healthy ideas for soups and desserts.   Overview     The Pritikin Solution Program Overview Clinical staff conducted group or individual video education with verbal and written material and guidebook.  Patient learns that the results of the [place] Program have been documented in more than 100 articles published in peer-reviewed journals, and the benefits include reducing risk factors for (and, in some cases, even reversing) high cholesterol, high blood pressure, type 2 diabetes, obesity, and more! An overview of the three key pillars of the Pritikin Program will be covered: eating well, doing regular exercise, and having a healthy mind-set.  WORKSHOPS  Exercise: Exercise Basics: Building Your Action Plan Clinical staff led group instruction and group discussion with PowerPoint presentation and  patient guidebook. To enhance the learning environment the use of posters, models and videos may be added. At the conclusion of this workshop, patients will comprehend the difference between physical activity and exercise, as well as the benefits of incorporating both, into their routine. Patients will understand the FITT (Frequency, Intensity, Time, and Type) principle and how to use it to build an exercise action plan. In addition, safety concerns and other considerations for exercise and cardiac rehab will be addressed by the  presenter. The purpose of this lesson is to promote a comprehensive and effective weekly exercise routine in order to improve patients' overall level of fitness.   Managing Heart Disease: Your Path to a Healthier Heart Clinical staff led group instruction and group discussion with PowerPoint presentation and patient guidebook. To enhance the learning environment the use of posters, models and videos may be added.At the conclusion of this workshop, patients will understand the anatomy and physiology of the heart. Additionally, they will understand how Pritikin's three pillars impact the risk factors, the progression, and the management of heart disease.  The purpose of this lesson is to provide a high-level overview of the heart, heart disease, and how the Pritikin lifestyle positively impacts risk factors.  Exercise Biomechanics Clinical staff led group instruction and group discussion with PowerPoint presentation and patient guidebook. To enhance the learning environment the use of posters, models and videos may be added. Patients will learn how the structural parts of their bodies function and how these functions impact their daily activities, movement, and exercise. Patients will learn how to promote a neutral spine, learn how to manage pain, and identify ways to improve their physical movement in order to promote healthy living. The purpose of this lesson is to expose patients to common physical limitations that impact physical activity. Participants will learn practical ways to adapt and manage aches and pains, and to minimize their effect on regular exercise. Patients will learn how to maintain good posture while sitting, walking, and lifting.  Balance Training and Fall Prevention  Clinical staff led group instruction and group discussion with PowerPoint presentation and patient guidebook. To enhance the learning environment the use of posters, models and videos may be added. At the  conclusion of this workshop, patients will understand the importance of their sensorimotor skills (vision, proprioception, and the vestibular system) in maintaining their ability to balance as they age. Patients will apply a variety of balancing exercises that are appropriate for their current level of function. Patients will understand the common causes for poor balance, possible solutions to these problems, and ways to modify their physical environment in order to minimize their fall risk. The purpose of this lesson is to teach patients about the importance of maintaining balance as they age and ways to minimize their risk of falling.  WORKSHOPS   Nutrition:  Fueling a Scientist, research (physical sciences) led group instruction and group discussion with PowerPoint presentation and patient guidebook. To enhance the learning environment the use of posters, models and videos may be added. Patients will review the foundational principles of the [place] and understand what constitutes a serving size in each of the food groups. Patients will also learn Pritikin-friendly foods that are better choices when away from home and review make-ahead meal and snack options. Calorie density will be reviewed and applied to three nutrition priorities: weight maintenance, weight loss, and weight gain. The purpose of this lesson is to reinforce (in a group setting) the key concepts around what patients  are recommended to eat and how to apply these guidelines when away from home by planning and selecting Pritikin-friendly options. Patients will understand how calorie density may be adjusted for different weight management goals.  Mindful Eating  Clinical staff led group instruction and group discussion with PowerPoint presentation and patient guidebook. To enhance the learning environment the use of posters, models and videos may be added. Patients will briefly review the concepts of the [place] and the  importance of low-calorie dense foods. The concept of mindful eating will be introduced as well as the importance of paying attention to internal hunger signals. Triggers for non-hunger eating and techniques for dealing with triggers will be explored. The purpose of this lesson is to provide patients with the opportunity to review the basic principles of the [place], discuss the value of eating mindfully and how to measure internal cues of hunger and fullness using the Hunger Scale. Patients will also discuss reasons for non-hunger eating and learn strategies to use for controlling emotional eating.  Targeting Your Nutrition Priorities Clinical staff led group instruction and group discussion with PowerPoint presentation and patient guidebook. To enhance the learning environment the use of posters, models and videos may be added. Patients will learn how to determine their genetic susceptibility to disease by reviewing their family history. Patients will gain insight into the importance of diet as part of an overall healthy lifestyle in mitigating the impact of genetics and other environmental insults. The purpose of this lesson is to provide patients with the opportunity to assess their personal nutrition priorities by looking at their family history, their own health history and current risk factors. Patients will also be able to discuss ways of prioritizing and modifying the [place] for their highest risk areas  Menu  Clinical staff led group instruction and group discussion with PowerPoint presentation and patient guidebook. To enhance the learning environment the use of posters, models and videos may be added. Using menus brought in from [employer], or printed from [employer], patients will apply the [place] dining out guidelines that were presented in the [employer] video. Patients will also be able to practice these guidelines in a variety of  provided scenarios. The purpose of this lesson is to provide patients with the opportunity to practice hands-on learning of the [place] with actual menus and practice scenarios.  Label Reading Clinical staff led group instruction and group discussion with PowerPoint presentation and patient guidebook. To enhance the learning environment the use of posters, models and videos may be added. Patients will review and discuss the Pritikin label reading guidelines presented in Pritikin's Label Reading Educational series video. Using fool labels brought in from local grocery stores and markets, patients will apply the label reading guidelines and determine if the packaged food meet the Pritikin guidelines. The purpose of this lesson is to provide patients with the opportunity to review, discuss, and practice hands-on learning of the Pritikin Label Reading guidelines with actual packaged food labels. [place] Workshops are designed to teach patients ways to prepare quick, simple, and affordable recipes at home. The importance of nutrition's role in chronic disease risk reduction is reflected in its emphasis in the overall Pritikin program. By learning how to prepare essential core Pritikin Eating Plan recipes, patients will increase control over what they eat; be able to customize the flavor of foods without the use of added salt, sugar, or fat; and  improve the quality of the food they consume. By learning a set of core recipes which are easily assembled, quickly prepared, and affordable, patients are more likely to prepare more healthy foods at home. These workshops focus on convenient breakfasts, simple entres, side dishes, and desserts which can be prepared with minimal effort and are consistent with nutrition recommendations for cardiovascular risk reduction. Cooking [employer] are taught by a Engineer, materials (RD) who has been trained by the  [employer]. The chef or RD has a clear understanding of the importance of minimizing - if not completely eliminating - added fat, sugar, and sodium in recipes. Throughout the series of [place] Workshop sessions, patients will learn about healthy ingredients and efficient methods of cooking to build confidence in their capability to prepare    Cooking School weekly topics:  Adding Flavor- Sodium-Free  Fast and Healthy Breakfasts  Powerhouse Plant-Based Proteins  Satisfying Salads and Dressings  Simple Sides and Sauces  International Cuisine-Spotlight on the [place] Zones  Delicious Desserts  Savory Soups  Efficiency Cooking - Meals in a Snap  Tasty Appetizers and Snacks  Comforting Weekend Breakfasts  One-Pot Wonders   Fast Evening Meals  Easy [place] (Psychosocial): New Thoughts, New Behaviors Clinical staff led group instruction and group discussion with PowerPoint presentation and patient guidebook. To enhance the learning environment the use of posters, models and videos may be added. Patients will learn and practice techniques for developing effective health and lifestyle goals. Patients will be able to effectively apply the goal setting process learned to develop at least one new personal goal.  The purpose of this lesson is to expose patients to a new skill set of behavior modification techniques such as techniques setting SMART goals, overcoming barriers, and achieving new thoughts and new behaviors.  Managing Moods and Relationships Clinical staff led group instruction and group discussion with PowerPoint presentation and patient guidebook. To enhance the learning environment the use of posters, models and videos may be added. Patients will learn how emotional and chronic stress factors can impact their health and relationships. They will learn healthy ways to manage their moods and utilize  positive coping mechanisms. In addition, ICR patients will learn ways to improve communication skills. The purpose of this lesson is to expose patients to ways of understanding how one's mood and health are intimately connected. Developing a healthy outlook can help build positive relationships and connections with others. Patients will understand the importance of utilizing effective communication skills that include actively listening and being heard. They will learn and understand the importance of the "4 Cs" and especially Connections in fostering of a Healthy Mind-Set.  Healthy Sleep for a Healthy Heart Clinical staff led group instruction and group discussion with PowerPoint presentation and patient guidebook. To enhance the learning environment the use of posters, models and videos may be added. At the conclusion of this workshop, patients will be able to demonstrate knowledge of the importance of sleep to overall health, well-being, and quality of life. They will understand the symptoms of, and treatments for, common sleep disorders. Patients will also be able to identify daytime and nighttime behaviors which impact sleep, and they will be able to apply these tools to help manage sleep-related challenges. The purpose of this lesson is to provide patients with a general overview of sleep and outline the importance of quality sleep. Patients will learn about a few of the  most common sleep disorders. Patients will also be introduced to the concept of "sleep hygiene," and discover ways to self-manage certain sleeping problems through simple daily behavior changes. Finally, the workshop will motivate patients by clarifying the links between quality sleep and their goals of heart-healthy living.   Recognizing and Reducing Stress Clinical staff led group instruction and group discussion with PowerPoint presentation and patient guidebook. To enhance the learning environment the use of posters, models and  videos may be added. At the conclusion of this workshop, patients will be able to understand the types of stress reactions, differentiate between acute and chronic stress, and recognize the impact that chronic stress has on their health. They will also be able to apply different coping mechanisms, such as reframing negative self-talk. Patients will have the opportunity to practice a variety of stress management techniques, such as deep abdominal breathing, progressive muscle relaxation, and/or guided imagery.  The purpose of this lesson is to educate patients on the role of stress in their lives and to provide healthy techniques for coping with it.  Learning Barriers/Preferences:  Learning Barriers/Preferences - 07/08/22 1534       Learning Barriers/Preferences   Learning Barriers Sight   wears reading glasses   Learning Preferences Audio;Verbal Instruction;Video;Computer/Internet;Written Material;Group Instruction;Individual Instruction;Pictoral;Skilled Demonstration             Education Topics:  Knowledge Questionnaire Score:  Knowledge Questionnaire Score - 07/08/22 1535       Knowledge Questionnaire Score   Pre Score 23/24             Core Components/Risk Factors/Patient Goals at Admission:  Personal Goals and Risk Factors at Admission - 07/08/22 1535       Core Components/Risk Factors/Patient Goals on Admission    Weight Management Yes;Weight Loss    Intervention Weight Management: Develop a combined nutrition and exercise program designed to reach desired caloric intake, while maintaining appropriate intake of nutrient and fiber, sodium and fats, and appropriate energy expenditure required for the weight goal.;Weight Management: Provide education and appropriate resources to help participant work on and attain dietary goals.;Weight Management/Obesity: Establish reasonable short term and long term weight goals.    Admit Weight 186 lb 4.6 oz (84.5 kg)    Expected Outcomes  Short Term: Continue to assess and modify interventions until short term weight is achieved;Long Term: Adherence to nutrition and physical activity/exercise program aimed toward attainment of established weight goal;Weight Maintenance: Understanding of the daily nutrition guidelines, which includes 25-35% calories from fat, 7% or less cal from saturated fats, less than 221m cholesterol, less than 1.5gm of sodium, & 5 or more servings of fruits and vegetables daily;Weight Loss: Understanding of general recommendations for a balanced deficit meal plan, which promotes 1-2 lb weight loss per week and includes a negative energy balance of [phone] kcal/d;Understanding recommendations for meals to include 15-35% energy as protein, 25-35% energy from fat, 35-60% energy from carbohydrates, less than 2036mof dietary cholesterol, 20-35 gm of total fiber daily;Understanding of distribution of calorie intake throughout the day with the consumption of 4-5 meals/snacks    Diabetes Yes    Intervention Provide education about signs/symptoms and action to take for hypo/hyperglycemia.;Provide education about proper nutrition, including hydration, and aerobic/resistive exercise prescription along with prescribed medications to achieve blood glucose in normal ranges: Fasting glucose 65-99 mg/dL    Expected Outcomes Short Term: Participant verbalizes understanding of the signs/symptoms and immediate care of hyper/hypoglycemia, proper foot care and importance of medication, aerobic/resistive exercise and nutrition  plan for blood glucose control.;Long Term: Attainment of HbA1C < 7%.    Hypertension Yes    Intervention Provide education on lifestyle modifcations including regular physical activity/exercise, weight management, moderate sodium restriction and increased consumption of fresh fruit, vegetables, and low fat dairy, alcohol moderation, and smoking cessation.;Monitor prescription use compliance.    Expected Outcomes Short  Term: Continued assessment and intervention until BP is < 140/48m HG in hypertensive participants. < 130/859mHG in hypertensive participants with diabetes, heart failure or chronic kidney disease.;Long Term: Maintenance of blood pressure at goal levels.    Lipids Yes    Intervention Provide education and support for participant on nutrition & aerobic/resistive exercise along with prescribed medications to achieve LDL <7075mHDL >48m84m  Expected Outcomes Short Term: Participant states understanding of desired cholesterol values and is compliant with medications prescribed. Participant is following exercise prescription and nutrition guidelines.;Long Term: Cholesterol controlled with medications as prescribed, with individualized exercise RX and with personalized nutrition plan. Value goals: LDL < 70mg3mL > 40 mg.             Core Components/Risk Factors/Patient Goals Review:   Goals and Risk Factor Review     Row Name 07/18/22 0941 07/22/22 1621           Core Components/Risk Factors/Patient Goals Review   Personal Goals Review Weight Management/Obesity;Hypertension;Lipids;Diabetes Weight Management/Obesity;Hypertension;Lipids;Diabetes      Review [facility] intensive cardiac rehab on 07/16/22. [facility] St[facility]ell with exercise. Vital sign were stable. CBG was 89. patient given Ginger Ale and fruit Recheck CBG 113. Asked ChrisGerald Stabsat a healthy snack prior to coming to class as he had eaten lunch at 12:30. [facility] intensive cardiac rehab on 07/16/22. [facility] St[facility]ell with exercise. Vital sign were stable. CBG was a little low prior to exercise and was given a snack      Expected Outcomes ChrisG[facility] Stabs continue to participate in intensive cardiac rehab for exercise, nutrition and lifestyle modifications ChrisG[facility] Stabs continue to participate in intensive cardiac rehab for exercise, nutrition and lifestyle modifications               Core Components/Risk Factors/Patient Goals at Discharge  (Final Review):   Goals and Risk Factor Review - 07/22/22 1621       Core Components/Risk Factors/Patient Goals Review   Personal Goals Review Weight Management/Obesity;Hypertension;Lipids;Diabetes    Review [facility] intensive cardiac rehab on 07/16/22. [facility] Stabswell with exercise. Vital sign were stable. CBG was a little low prior to exercise and was given a snack    Expected Outcomes ChrisG[facility] Stabs continue to participate in intensive cardiac rehab for exercise, nutrition and lifestyle modifications             ITP Comments:  ITP Comments     Row Name 07/08/22 1539 07/18/22 0937         ITP Comments Dr TraciFransico HimMedical Director, Introduction to [facility]/ Intensive Cardiac Rehab. Initial orientation packet Reviewed with the patient. 30 Day ITP Review. [facility] intensive cardiac rehab on 07/16/22 and is off to a good start  to exercise.               Comments: See ITP comments.MariaHarrell GaveSN

## 2022-07-23 ENCOUNTER — Encounter (HOSPITAL_COMMUNITY)
Admission: RE | Admit: 2022-07-23 | Discharge: 2022-07-23 | Disposition: A | Discharge status: Home / Self Care | Payer: BC Managed Care – PPO | Source: Ambulatory Visit | Attending: Cardiology | Admitting: Cardiology

## 2022-07-23 DIAGNOSIS — Z951 Presence of aortocoronary bypass graft: Secondary | ICD-10-CM

## 2022-07-23 DIAGNOSIS — I214 Non-ST elevation (NSTEMI) myocardial infarction: Secondary | ICD-10-CM | POA: Diagnosis present

## 2022-07-23 LAB — GLUCOSE, CAPILLARY
Glucose-Capillary: 141 mg/dL — ABNORMAL HIGH (ref 70–99)
Glucose-Capillary: 95 mg/dL (ref 70–99)

## 2022-07-24 ENCOUNTER — Other Ambulatory Visit: Payer: BC Managed Care – PPO

## 2022-07-24 DIAGNOSIS — Z125 Encounter for screening for malignant neoplasm of prostate: Secondary | ICD-10-CM

## 2022-07-24 DIAGNOSIS — I214 Non-ST elevation (NSTEMI) myocardial infarction: Secondary | ICD-10-CM

## 2022-07-24 DIAGNOSIS — Z951 Presence of aortocoronary bypass graft: Secondary | ICD-10-CM

## 2022-07-24 DIAGNOSIS — N183 Chronic kidney disease, stage 3 unspecified: Secondary | ICD-10-CM

## 2022-07-25 ENCOUNTER — Encounter (HOSPITAL_COMMUNITY)
Admission: RE | Admit: 2022-07-25 | Discharge: 2022-07-25 | Disposition: A | Discharge status: Home / Self Care | Payer: BC Managed Care – PPO | Source: Ambulatory Visit | Attending: Cardiology | Admitting: Cardiology

## 2022-07-25 DIAGNOSIS — I214 Non-ST elevation (NSTEMI) myocardial infarction: Secondary | ICD-10-CM | POA: Diagnosis not present

## 2022-07-25 DIAGNOSIS — Z951 Presence of aortocoronary bypass graft: Secondary | ICD-10-CM

## 2022-07-25 LAB — PSA: PSA: 0.76 ng/mL (ref <=4.00)

## 2022-07-25 LAB — CBC WITH DIFFERENTIAL/PLATELET
Absolute Monocytes: 503 cells/uL (ref 200–950)
Basophils Absolute: 20 cells/uL (ref 0–200)
Basophils Relative: 0.3 %
Eosinophils Absolute: 143 cells/uL (ref 15–500)
Eosinophils Relative: 2.1 %
HCT: 46.1 % (ref 38.5–50.0)
Hemoglobin: 15.2 g/dL (ref 13.2–17.1)
Lymphs Abs: 1578 cells/uL (ref 850–3900)
MCH: 26 pg — ABNORMAL LOW (ref 27.0–33.0)
MCHC: 33 g/dL (ref 32.0–36.0)
MCV: 78.9 fL — ABNORMAL LOW (ref 80.0–100.0)
MPV: 9.5 fL (ref 7.5–12.5)
Monocytes Relative: 7.4 %
Neutro Abs: 4556 cells/uL (ref 1500–7800)
Neutrophils Relative %: 67 %
Platelets: 266 10*3/uL (ref 140–400)
RBC: 5.84 10*6/uL — ABNORMAL HIGH (ref 4.20–5.80)
RDW: 14.2 % (ref 11.0–15.0)
Total Lymphocyte: 23.2 %
WBC: 6.8 10*3/uL (ref 3.8–10.8)

## 2022-07-25 LAB — COMPLETE METABOLIC PANEL WITH GFR
AG Ratio: 1.2 (calc) (ref 1.0–2.5)
ALT: 24 U/L (ref 9–46)
AST: 19 U/L (ref 10–35)
Albumin: 4 g/dL (ref 3.6–5.1)
Alkaline phosphatase (APISO): 58 U/L (ref 35–144)
BUN: 17 mg/dL (ref 7–25)
CO2: 23 mmol/L (ref 20–32)
Calcium: 8.9 mg/dL (ref 8.6–10.3)
Chloride: 104 mmol/L (ref 98–110)
Creat: 0.92 mg/dL (ref 0.70–1.35)
Globulin: 3.3 g/dL (calc) (ref 1.9–3.7)
Glucose, Bld: 110 mg/dL — ABNORMAL HIGH (ref 65–99)
Potassium: 4.5 mmol/L (ref 3.5–5.3)
Sodium: 136 mmol/L (ref 135–146)
Total Bilirubin: 0.3 mg/dL (ref 0.2–1.2)
Total Protein: 7.3 g/dL (ref 6.1–8.1)
eGFR: 93 mL/min/{1.73_m2} (ref >=60)

## 2022-07-25 LAB — LIPID PANEL
Cholesterol: 130 mg/dL (ref <200)
HDL: 32 mg/dL — ABNORMAL LOW (ref >=40)
LDL Cholesterol (Calc): 76 mg/dL (calc)
Non-HDL Cholesterol (Calc): 98 mg/dL (calc) (ref <130)
Total CHOL/HDL Ratio: 4.1 (calc) (ref <5.0)
Triglycerides: 138 mg/dL (ref <150)

## 2022-07-25 LAB — HEMOGLOBIN A1C
Hgb A1c MFr Bld: 6.5 % of total Hgb — ABNORMAL HIGH (ref <5.7)
Mean Plasma Glucose: 140 mg/dL
eAG (mmol/L): 7.7 mmol/L

## 2022-07-25 LAB — MICROALBUMIN / CREATININE URINE RATIO
Creatinine, Urine: 57 mg/dL (ref 20–320)
Microalb Creat Ratio: 5 mcg/mg creat (ref <30)
Microalb, Ur: 0.3 mg/dL

## 2022-07-25 LAB — VITAMIN B12: Vitamin B-12: 1207 pg/mL — ABNORMAL HIGH (ref 200–1100)

## 2022-07-28 ENCOUNTER — Encounter (HOSPITAL_COMMUNITY)
Admission: RE | Admit: 2022-07-28 | Discharge: 2022-07-28 | Disposition: A | Discharge status: Home / Self Care | Payer: BC Managed Care – PPO | Source: Ambulatory Visit | Attending: Cardiology | Admitting: Cardiology

## 2022-07-28 DIAGNOSIS — I214 Non-ST elevation (NSTEMI) myocardial infarction: Secondary | ICD-10-CM | POA: Diagnosis not present

## 2022-07-28 DIAGNOSIS — Z951 Presence of aortocoronary bypass graft: Secondary | ICD-10-CM

## 2022-07-30 ENCOUNTER — Encounter (HOSPITAL_COMMUNITY): Payer: BC Managed Care – PPO

## 2022-07-30 LAB — HM DIABETES EYE EXAM

## 2022-08-01 ENCOUNTER — Encounter (HOSPITAL_COMMUNITY)
Admission: RE | Admit: 2022-08-01 | Discharge: 2022-08-01 | Disposition: A | Discharge status: Home / Self Care | Payer: BC Managed Care – PPO | Source: Ambulatory Visit | Attending: Cardiology | Admitting: Cardiology

## 2022-08-01 DIAGNOSIS — I214 Non-ST elevation (NSTEMI) myocardial infarction: Secondary | ICD-10-CM | POA: Diagnosis not present

## 2022-08-01 DIAGNOSIS — Z951 Presence of aortocoronary bypass graft: Secondary | ICD-10-CM

## 2022-08-04 ENCOUNTER — Encounter (HOSPITAL_COMMUNITY)
Admission: RE | Admit: 2022-08-04 | Discharge: 2022-08-04 | Disposition: A | Discharge status: Home / Self Care | Payer: BC Managed Care – PPO | Source: Ambulatory Visit | Attending: Cardiology | Admitting: Cardiology

## 2022-08-04 DIAGNOSIS — I214 Non-ST elevation (NSTEMI) myocardial infarction: Secondary | ICD-10-CM | POA: Diagnosis not present

## 2022-08-04 DIAGNOSIS — Z951 Presence of aortocoronary bypass graft: Secondary | ICD-10-CM

## 2022-08-04 NOTE — Progress Notes (Signed)
CARDIAC REHAB PHASE 2  Reviewed home exercise with pt today. Pt is tolerating exercise well. Pt will continue to exercise on his own by going to the community gym for 30-45 minutes per session 2-4 days a week in addition to the 3 days in [facility]. Talked with pt about gradual progression because he is interested in wt lifting. Advised pt on THRR, RPE scale, hydration and temperature/humidity precautions. Reinforced S/S to stop exercise and when to call MD vs 911. Encouraged warm up cool down and stretches with exercise sessions. Pt verbalized understanding, all questions were answered and pt was given a copy to take home.    Kirby Funk ACSM-CEP 08/04/2022 5:03 PM

## 2022-08-06 ENCOUNTER — Encounter (HOSPITAL_COMMUNITY)
Admission: RE | Admit: 2022-08-06 | Discharge: 2022-08-06 | Disposition: A | Discharge status: Home / Self Care | Payer: BC Managed Care – PPO | Source: Ambulatory Visit | Attending: Cardiology | Admitting: Cardiology

## 2022-08-06 DIAGNOSIS — I214 Non-ST elevation (NSTEMI) myocardial infarction: Secondary | ICD-10-CM

## 2022-08-06 DIAGNOSIS — Z951 Presence of aortocoronary bypass graft: Secondary | ICD-10-CM

## 2022-08-08 ENCOUNTER — Encounter (HOSPITAL_COMMUNITY)
Admission: RE | Admit: 2022-08-08 | Discharge: 2022-08-08 | Disposition: A | Discharge status: Home / Self Care | Payer: BC Managed Care – PPO | Source: Ambulatory Visit | Attending: Cardiology | Admitting: Cardiology

## 2022-08-08 VITALS — Ht 69.0 in | Wt 181.4 lb

## 2022-08-08 DIAGNOSIS — Z951 Presence of aortocoronary bypass graft: Secondary | ICD-10-CM

## 2022-08-08 DIAGNOSIS — I214 Non-ST elevation (NSTEMI) myocardial infarction: Secondary | ICD-10-CM | POA: Diagnosis not present

## 2022-08-08 NOTE — Progress Notes (Signed)
Discharge Progress Report  Patient Details  Name: Dustin Wood MRN: [id number] Date of Birth: 1956-06-17 Referring Provider:   Flowsheet Row INTENSIVE CARDIAC REHAB ORIENT from 07/08/2022 in [facility]  Referring Provider Bryan Lemma, MD        Number of Visits: 16  Reason for Discharge:  Patient reached a stable level of exercise. Patient independent in their exercise. Patient has met program and personal goals.  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  ADL UCSD:   Initial Exercise Prescription:  Initial Exercise Prescription - 07/08/22 1500       Date of Initial Exercise RX and Referring Provider   Date 07/08/22    Referring Provider Bryan Lemma, MD    Expected Discharge Date 09/05/22      Recumbant Bike   Level 2    RPM 60    Watts 25    Minutes 15    METs 3.3      Arm Ergometer   Level 2    Watts 25    RPM 60    Minutes 15    METs 3.3      Prescription Details   Frequency (times per week) 3    Duration Progress to 30 minutes of continuous aerobic without signs/symptoms of physical distress      Intensity   THRR 40-80% of Max Heartrate 62-125    Ratings of Perceived Exertion 11-13    Perceived Dyspnea 0-4      Progression   Progression Continue progressive overload as per policy without signs/symptoms or physical distress.      Resistance Training   Training Prescription Yes    Weight 4 lbs    Reps 10-15             Discharge Exercise Prescription (Final Exercise Prescription Changes):  Exercise Prescription Changes - 08/08/22 1635       Response to Exercise   Blood Pressure (Admit) 104/62    Blood Pressure (Exercise) 122/62    Blood Pressure (Exit) 100/60    Heart Rate (Admit) 74 bpm    Heart Rate (Exercise) 108 bpm    Heart Rate (Exit) 94 bpm     Oxygen Saturation (Exercise) 99 %    Rating of Perceived Exertion (Exercise) 10    Perceived Dyspnea (Exercise) 0    Symptoms none    Comments Pt graduated to pritikin program    Duration Progress to 30 minutes of  aerobic without signs/symptoms of physical distress    Intensity THRR unchanged      Progression   Progression Continue to progress workloads to maintain intensity without signs/symptoms of physical distress.    Average METs 4.16      Resistance Training   Training Prescription Yes    Weight 4 lbs wts    Reps 10-15    Time 10 Minutes      Recumbant Bike   Level 2.5    RPM 71    Watts 32    Minutes 15    METs 4.4      Track   Laps 7   220 ft per lap + 133 ft = 1673   Minutes 6    METs 3.91      Home Exercise Plan   Plans to continue exercise at [facility] (comment)  Frequency Add 3 additional days to program exercise sessions.    Initial Home Exercises Provided 08/04/22             Functional Capacity:  6 Minute Walk     Row Name 07/08/22 1410 08/08/22 1630       6 Minute Walk   Phase Initial Discharge    Distance 1400 feet 1673 feet    Distance % Change -- 19.5 %    Distance Feet Change -- 273 ft    Walk Time 6 minutes 6 minutes    # of Rest Breaks 0 0    MPH 2.7 3.17    METS 3.3 3.91    RPE 8 9    Perceived Dyspnea  0 0    VO2 Peak 11.5 13.67    Symptoms No No    Resting HR 69 bpm 74 bpm    Resting BP 112/68 104/62    Resting Oxygen Saturation  96 % --    Exercise Oxygen Saturation  during 6 min walk 98 % 99 %    Max Ex. HR 92 bpm 102 bpm    Max Ex. BP 120/66 122/62    2 Minute Post BP 110/60 116/62             Psychological, QOL, Others - Outcomes: PHQ 2/9:    08/08/2022    3:43 PM 07/08/2022    3:45 PM 06/30/2022   10:33 AM 04/22/2022   11:06 AM 06/29/2020    3:39 PM  Depression screen PHQ 2/9  Decreased Interest 0 0 0 0 0  Down, Depressed, Hopeless 0 0 0 0 0  PHQ - 2 Score 0 0 0 0 0  Altered sleeping     0   Tired, decreased energy     0  Change in appetite     0  Feeling bad or failure about yourself      0  Trouble concentrating     0  Moving slowly or fidgety/restless     0  Suicidal thoughts     0  PHQ-9 Score     0  Difficult doing work/chores     Not difficult at all    Quality of Life:  Quality of Life - 08/08/22 1645       Quality of Life Scores   Health/Function Post 21.43 %    Socioeconomic Post 20.64 %    Psych/Spiritual Post 21.36 %    Family Post 22.8 %    GLOBAL Post 21.46 %             Personal Goals: Goals established at orientation with interventions provided to work toward goal.  Personal Goals and Risk Factors at Admission - 07/08/22 1535       Core Components/Risk Factors/Patient Goals on Admission    Weight Management Yes;Weight Loss    Intervention Weight Management: Develop a combined nutrition and exercise program designed to reach desired caloric intake, while maintaining appropriate intake of nutrient and fiber, sodium and fats, and appropriate energy expenditure required for the weight goal.;Weight Management: Provide education and appropriate resources to help participant work on and attain dietary goals.;Weight Management/Obesity: Establish reasonable short term and long term weight goals.    Admit Weight 186 lb 4.6 oz (84.5 kg)    Expected Outcomes Short Term: Continue to assess and modify interventions until short term weight is achieved;Long Term: Adherence to nutrition and physical activity/exercise program aimed toward attainment of established weight goal;Weight Maintenance:  Understanding of the daily nutrition guidelines, which includes 25-35% calories from fat, 7% or less cal from saturated fats, less than 200mg  cholesterol, less than 1.5gm of sodium, & 5 or more servings of fruits and vegetables daily;Weight Loss: Understanding of general recommendations for a balanced deficit meal plan, which promotes 1-2 lb weight loss per week and includes a  negative energy balance of [phone] kcal/d;Understanding recommendations for meals to include 15-35% energy as protein, 25-35% energy from fat, 35-60% energy from carbohydrates, less than 200mg  of dietary cholesterol, 20-35 gm of total fiber daily;Understanding of distribution of calorie intake throughout the day with the consumption of 4-5 meals/snacks    Diabetes Yes    Intervention Provide education about signs/symptoms and action to take for hypo/hyperglycemia.;Provide education about proper nutrition, including hydration, and aerobic/resistive exercise prescription along with prescribed medications to achieve blood glucose in normal ranges: Fasting glucose 65-99 mg/dL    Expected Outcomes Short Term: Participant verbalizes understanding of the signs/symptoms and immediate care of hyper/hypoglycemia, proper foot care and importance of medication, aerobic/resistive exercise and nutrition plan for blood glucose control.;Long Term: Attainment of HbA1C < 7%.    Hypertension Yes    Intervention Provide education on lifestyle modifcations including regular physical activity/exercise, weight management, moderate sodium restriction and increased consumption of fresh fruit, vegetables, and low fat dairy, alcohol moderation, and smoking cessation.;Monitor prescription use compliance.    Expected Outcomes Short Term: Continued assessment and intervention until BP is < 140/21mm HG in hypertensive participants. < 130/33mm HG in hypertensive participants with diabetes, heart failure or chronic kidney disease.;Long Term: Maintenance of blood pressure at goal levels.    Lipids Yes    Intervention Provide education and support for participant on nutrition & aerobic/resistive exercise along with prescribed medications to achieve LDL 70mg , HDL >40mg .    Expected Outcomes Short Term: Participant states understanding of desired cholesterol values and is compliant with medications prescribed. Participant is following  exercise prescription and nutrition guidelines.;Long Term: Cholesterol controlled with medications as prescribed, with individualized exercise RX and with personalized nutrition plan. Value goals: LDL < 70mg , HDL > 40 mg.              Personal Goals Discharge:  Goals and Risk Factor Review     Row Name 07/18/22 0941 07/22/22 1621           Core Components/Risk Factors/Patient Goals Review   Personal Goals Review Weight Management/Obesity;Hypertension;Lipids;Diabetes Weight Management/Obesity;Hypertension;Lipids;Diabetes      Review Dustin Wood started intensive cardiac rehab on 07/16/22. Dustin Wood did well with exercise. Vital sign were stable. CBG was 89. patient given Ginger Ale and fruit Recheck CBG 113. Asked Dustin Wood to eat a healthy snack prior to coming to class as he had eaten lunch at 12:30. Dustin Wood started intensive cardiac rehab on 07/16/22. Dustin Wood did well with exercise. Vital sign were stable. CBG was a little low prior to exercise and was given a snack      Expected Outcomes Dustin Wood will continue to participate in intensive cardiac rehab for exercise, nutrition and lifestyle modifications Dustin Wood will continue to participate in intensive cardiac rehab for exercise, nutrition and lifestyle modifications               Exercise Goals and Review:  Exercise Goals     Row Name 07/08/22 1538             Exercise Goals   Increase Physical Activity Yes       Intervention Provide advice, education, support and counseling about  physical activity/exercise needs.;Develop an individualized exercise prescription for aerobic and resistive training based on initial evaluation findings, risk stratification, comorbidities and participant's personal goals.       Expected Outcomes Short Term: Attend rehab on a regular basis to increase amount of physical activity.;Long Term: Add in home exercise to make exercise part of routine and to increase amount of physical activity.;Long Term: Exercising regularly  at least 3-5 days a week.       Increase Strength and Stamina Yes       Intervention Provide advice, education, support and counseling about physical activity/exercise needs.;Develop an individualized exercise prescription for aerobic and resistive training based on initial evaluation findings, risk stratification, comorbidities and participant's personal goals.       Expected Outcomes Short Term: Increase workloads from initial exercise prescription for resistance, speed, and METs.;Short Term: Perform resistance training exercises routinely during rehab and add in resistance training at home;Long Term: Improve cardiorespiratory fitness, muscular endurance and strength as measured by increased METs and functional capacity ( )       Able to understand and use rate of perceived exertion (RPE) scale Yes       Intervention Provide education and explanation on how to use RPE scale       Expected Outcomes Short Term: Able to use RPE daily in rehab to express subjective intensity level;Long Term:  Able to use RPE to guide intensity level when exercising independently       Knowledge and understanding of Target Heart Rate Range (THRR) Yes       Intervention Provide education and explanation of THRR including how the numbers were predicted and where they are located for reference       Expected Outcomes Short Term: Able to state/look up THRR;Short Term: Able to use daily as guideline for intensity in rehab;Long Term: Able to use THRR to govern intensity when exercising independently       Understanding of Exercise Prescription Yes       Intervention Provide education, explanation, and written materials on patient's individual exercise prescription       Expected Outcomes Short Term: Able to explain program exercise prescription;Long Term: Able to explain home exercise prescription to exercise independently                Exercise Goals Re-Evaluation:  Exercise Goals Re-Evaluation     Row Name  07/16/22 1631 08/04/22 1643 08/08/22 1638         Exercise Goal Re-Evaluation   Exercise Goals Review Increase Physical Activity;Understanding of Exercise Prescription;Increase Strength and Stamina;Able to understand and use rate of perceived exertion (RPE) scale;Knowledge and understanding of Target Heart Rate Range (THRR) Increase Physical Activity;Understanding of Exercise Prescription;Increase Strength and Stamina;Able to understand and use rate of perceived exertion (RPE) scale;Knowledge and understanding of Target Heart Rate Range (THRR) Increase Physical Activity;Understanding of Exercise Prescription;Increase Strength and Stamina;Able to understand and use rate of perceived exertion (RPE) scale;Knowledge and understanding of Target Heart Rate Range (THRR)     Comments Pt first day in the [facility] program. Pt tolerated exercise well with an average MET level of 2.55. Pt is learning his THRR, RPE and ExRx. Reviewed MET's, goals and home ExRx. Pt tolerated exercise well with an average MET level of 3.65. Pt feels good about his progress so far and feels good about his goals so far of gaining strength, stamina and wt loss. Pt is still working to better flexibilty and hasnt noticed much change. Gave him a packet today  to practice weights and stretches. Reviewed gradual progression with wts starting low and going slow. Pt will exercise at a community gym for 30-45 mins per session, 2-4 days a week by doing different cardio equipment and some light dumbell weights to start Ptgraduated the Pritikin program today. Pt states he will be unable to complete all sessions due to work constraints. Pt tolerated exercise well with an average MET level of 4.16. Post results showed an increase 29ft and 19.5%. Pt feels good about his progress and will continue to exercise on his own by going to his community gym 30-45 mins 3-5 days a week     Expected Outcomes Will continue to monitor pt and progress workloads as  tolerated without sign or symptom Will continue to monitor pt and progress workloads as tolerated without sign or symptom. Pt will add in 2-4 days for 30-45 mins of exercise a week. Pt will continue to exercise on his own and gain strength              Nutrition & Weight - Outcomes:  Pre Biometrics - 07/08/22 1328       Pre Biometrics   Waist Circumference 39.25 inches    Hip Circumference 39.75 inches    Waist to Hip Ratio 0.99 %    Triceps Skinfold 7 mm    % Body Fat 23.8 %    Grip Strength 30 kg    Flexibility 11 in    Single Leg Stand 30 seconds             Post Biometrics - 08/08/22 1652        Post  Biometrics   Height 5\' 9"  (1.753 m)    Weight 82.3 kg    Waist Circumference 38.75 inches    Hip Circumference 40 inches    Waist to Hip Ratio 0.97 %    BMI (Calculated) 26.78    Triceps Skinfold 9 mm    % Body Fat 24.2 %    Grip Strength 35 kg    Flexibility 11 in    Single Leg Stand 30 seconds             Nutrition:  Nutrition Therapy & Goals - 07/25/22 1536       Nutrition Therapy   Diet Heart Healthy/Carbohydrate Consistent Diet    Drug/Food Interactions Statins/Certain Fruits      Personal Nutrition Goals   Nutrition Goal Patient to identify strategies for reducing cardiovascular risk by attending the weekly Pritikin education and nutrition series    Personal Goal #2 Patient to improve diet quality by using the plate method as a guide for meal planning to include lean protein/plant protein, fruits, vegetables, whole grains,and nonfat dairy as part of balanced diet    Personal Goal #3 Patient to identify food sources and limit daily intake of saturated fat, trans fat, sodium, and refined carbohydrates    Comments Goals in action. Dustin Wood reports that he has already started improving his diet quality; lipid panel and A1c improved since heart attack in September. Dustin Wood will continue to benefit from participation in intensive cardiac rehab for exercise,  nutrition education, and lifestyle modification.      Intervention Plan   Intervention Prescribe, educate and counsel regarding individualized specific dietary modifications aiming towards targeted core components such as weight, hypertension, lipid management, diabetes, heart failure and other comorbidities.;Nutrition handout(s) given to patient.    Expected Outcomes Short Term Goal: Understand basic principles of dietary content, such as calories,  fat, sodium, cholesterol and nutrients.;Long Term Goal: Adherence to prescribed nutrition plan.             Nutrition Discharge:  Nutrition Assessments - 08/11/22 0910       Rate Your Plate Scores   Post Score 73             Education Questionnaire Score:  Knowledge Questionnaire Score - 08/08/22 1645       Knowledge Questionnaire Score   Post Score 23/24             Goals reviewed with patient; copy given to patient. Pt graduates from  Intensive/Traditional cardiac rehab program today with completion of   16 exercise and education sessions. Pt maintained good attendance and progressed nicely during their participation in rehab as evidenced by increased MET level.   Medication list reconciled. Repeat  PHQ score- 0 .  Pt has made significant lifestyle changes and should be commended for their success.  Dustin Wood achieved their goals during cardiac rehab.   Pt plans to continue exercise at the fitness center at his job. Upon medication review. Dustin Wood said that he has not been taking his rosuvastatin due to muscle aches. He has not yet  notified  Dr Elissa Hefty office. I told the patient I will notify the office on his behalf. Dustin Wood increased his distance on his post exercise walk test by 273 feet! We are proud of Dustin Wood progress!Dustin Headings RN BSN

## 2022-08-11 ENCOUNTER — Encounter (HOSPITAL_COMMUNITY): Payer: BC Managed Care – PPO

## 2022-08-13 ENCOUNTER — Encounter (HOSPITAL_COMMUNITY): Payer: BC Managed Care – PPO

## 2022-08-15 ENCOUNTER — Encounter (HOSPITAL_COMMUNITY): Payer: BC Managed Care – PPO

## 2022-08-18 ENCOUNTER — Encounter (HOSPITAL_COMMUNITY): Payer: BC Managed Care – PPO

## 2022-08-20 ENCOUNTER — Encounter (HOSPITAL_COMMUNITY): Payer: BC Managed Care – PPO

## 2022-08-22 ENCOUNTER — Encounter (HOSPITAL_COMMUNITY): Payer: BC Managed Care – PPO

## 2022-08-25 ENCOUNTER — Encounter (HOSPITAL_COMMUNITY): Payer: BC Managed Care – PPO

## 2022-08-25 ENCOUNTER — Encounter: Payer: Self-pay | Admitting: Cardiology

## 2022-08-25 ENCOUNTER — Other Ambulatory Visit: Payer: Self-pay | Admitting: Family Medicine

## 2022-08-25 DIAGNOSIS — E1169 Type 2 diabetes mellitus with other specified complication: Secondary | ICD-10-CM | POA: Insufficient documentation

## 2022-08-25 NOTE — Progress Notes (Unsigned)
Primary Care Provider: Susy Frizzle, MD [place] Cardiologist: Glenetta Hew, MD  Cardiology: Mayra Reel, NP Electrophysiologist: None  Clinic Note: No chief complaint on file.  ===================================  ASSESSMENT/PLAN   Problem List Items Addressed This Visit       Cardiology Problems   Hyperlipidemia associated with type 2 diabetes mellitus ([place])   NSTEMI (non-ST elevated myocardial infarction) ([place]) (Chronic)   Hypertriglyceridemia (Chronic)     Other   Myalgia due to statin   S/P CABG x 2 - Primary (Chronic)   Hilty, Nadean Corwin, MD  Leonie Man, MD The manuscript was prepared by Dr. Leighton Ruff, a cardiologist in [place] - he has been anti-statin his whole career, as was his father as I read who died in the early 1990's, but researched the effects of COQ10. The article claims that all of the statin trials prior to 2004 were biased due to industry sponsorship - but there are also several significant trials that continue to show statin benefits after that time. In addition, the 2015 PCSK9i trials, the recent outcomes data for bempedoic acid and other trials, add to a body of evidence that lower LDL is associated with cardiovascular disease benefit.  This paper is more of an opinion that uses the literature by making comparisons that are not stalactitically valid to support the author's conclusions.  While it is true that statin may deplete certain vitamins and ubiqinone, there has not been clear evidence linking this to the development of heart failure, even if it seem "mechanistically possible".  I would advise not pursing a statin if this is his opinion based on his research on this subject.  -Mali ===================================  HPI:    Dustin Wood is a 67 y.o. male with a PMH notable for non-STEMI (04/13/2022)--Severe LAD-D1 CAD (s/p all Arterial CABG x 2, LIMA-LAD, L Rad-D1), HTN, HLD associated with DM-2 (A1c 7.5) who presents  today for 47-monthfollow-up.  04/13/2022: Non-STEMI => Cardiac cath 04/14/2022 revealed LAD-D1 bifurcation lesion => referred for CABG CABG 04/09/2022-all arterial: LIMA-LAD, L rad-D1  BHerbert SetaSmoot was last seen on May 05, 2022 by DMayra Reel NP for post CABG follow-up.  He is doing well with no complaints.  Was tolerating meds.  Slowly increasing his walking up to 30 minutes a day broken to 2 walks per day.  Energy level slowly returning.  Chest incisions are healing well.  Was due to follow-up with CVTS soon. => LDL was 77 HDL 26 and triglycerides 349.  Lovaza improved.  Following diabetic/cardiac diet. => Was on ASA/Plavix (DAPT following non-STEMI), blood pressure 12.5 mg twice daily, amlodipine 5 mg daily, rosuvastatin 40 mg daily (however apparently he was only taking 20 mg -> patient was willing to compromise and discussion with PCP) and Lovaza (apparently Vascepa was not covered) started (due to TG 349).  Was also on empagliflozin 25 mg daily => plan was for PCP to follow-up labs in 3 months. => Referred for intensive cardiac rehab-initiated on 07/08/2022.  Recent Hospitalizations: None since hospitalization in September for non-STEMI-CABG  Patient was told by his pharmacist that there is a high risk of myopathy the combination of Crestor and Plavix therefore he was unwilling to take 40 mg of Crestor.?   Reviewed  CV studies:    The following studies were reviewed today: (if available, images/films reviewed: From Epic Chart or Care Everywhere) TTE 04/14/2022: (Non-STEMI) EF 50 to 55% (low normal).  Mild HK basal to mid inferior and inferolateral  wall.  Concordant septal motion.  GR 1 DD.  Normal LVEDP.  Normal RV size and function.  Normal RV P.  Normal MV and AoV.  Left Heart Cath 04/14/2022: 90% thrombotic ostial and proximal segmental LAD involving large D1.  Normal RCA and LCx.  Normal EF and EDP. => Due to location and bifurcation, referred for CABG.   Interval History:    Emran Molzahn Irby   CV Review of Symptoms (Summary): Cardiovascular ROS: {roscv:310661}  REVIEWED OF SYSTEMS   ROS  I have reviewed and (if needed) personally updated the patient's problem list, medications, allergies, past medical and surgical history, social and family history.   PAST MEDICAL HISTORY   Past Medical History:  Diagnosis Date   Diabetes mellitus without complication ([place])    Hyperlipidemia    Melanoma ([place]) 04/07/2019   right fore arm MIS TX EXC   Non-ST elevation myocardial infarction (NSTEMI) ([place]) 04/13/2022   Occlusive coronary artery disease 04/14/2022   Left Heart Cath 04/14/2022: 90% thrombotic ostial and proximal segmental LAD involving large D1.  Normal RCA and LCx.  Normal EF and EDP. => Due to location and bifurcation, referred for CABG.   SCC (squamous cell carcinoma) 06/30/2016   RIGHT DORSAL HAND CX3 5FU   SCC (squamous cell carcinoma) 06/30/2016   LEFT HAND TX CX3 5FU   Squamous cell carcinoma of skin 12/2015   RIGHT TEMPLE CX3 5FU    PAST SURGICAL HISTORY   Past Surgical History:  Procedure Laterality Date   CHOLECYSTECTOMY     CORONARY ARTERY BYPASS GRAFT N/A 04/17/2022   Procedure: CORONARY ARTERY BYPASS GRAFTING (CABG) X TWO, USING LEFT INTERNAL MAMMARY ARTERY AND LEFT ARM RADIAL ARTERY;  Surgeon: Lajuana Matte, MD;  Location: [facility];  Service: Open Heart Surgery;  Laterality: N/A;   HERNIA REPAIR     LEFT HEART CATH AND CORONARY ANGIOGRAPHY N/A 04/14/2022   Procedure: LEFT HEART CATH AND CORONARY ANGIOGRAPHY;  Surgeon: Lorretta Harp, MD;  Location: [facility];  Service: CV::; 90% thrombotic ostial and proximal segmental LAD involving large D1.  Normal RCA and LCx.  Normal EF and EDP. => Due to location and bifurcation, referred for CABG.   MELANOMA EXCISION     RADIAL ARTERY HARVEST N/A 04/17/2022   Procedure: LEFT RADIAL ARTERY HARVEST;  Surgeon: Lajuana Matte, MD;  Location: [facility];  Service: Open Heart Surgery;   Laterality: N/A;   TEE WITHOUT CARDIOVERSION N/A 04/17/2022   Procedure: TRANSESOPHAGEAL ECHOCARDIOGRAM (TEE);  Surgeon: Lajuana Matte, MD;  Location: [facility];  Service: Open Heart Surgery;  Laterality: N/A;   TRANSTHORACIC ECHOCARDIOGRAM  04/14/2022   (Non-STEMI) EF 50 to 55% (low normal).  Mild HK basal to mid inferior and inferolateral wall.  Concordant septal motion.  GR 1 DD.  Normal LVEDP.  Normal RV size and function.  Normal RV P.  Normal MV and AoV.    Immunization History  Administered Date(s) Administered   Influenza, Seasonal, Injecte, Preservative Fre 04/26/2014   Influenza,inj,Quad PF,6+ Mos 04/21/2013, 04/14/2018, 06/29/2020   Influenza-Unspecified 04/19/2015, 05/12/2017   Tdap 07/25/2014    MEDICATIONS/ALLERGIES   Current Meds  Medication Sig   Accu-Chek Softclix Lancets lancets Use as instructed   amLODipine (NORVASC) 5 MG tablet Take 1 tablet (5 mg total) by mouth daily.   ascorbic acid (VITAMIN C) 500 MG tablet Take 500 mg by mouth daily.   aspirin EC 81 MG tablet Take 1 tablet (81 mg total) by mouth daily.  Swallow whole.   blood glucose meter kit and supplies KIT Dispense based on patient and insurance preference. Use up to four times daily as directed.   Cholecalciferol (VITAMIN D3) 50 MCG (2000 UT) TABS Take 2,000 Units by mouth in the morning.   clopidogrel (PLAVIX) 75 MG tablet Take 1 tablet (75 mg total) by mouth daily.   empagliflozin (JARDIANCE) 25 MG TABS tablet Take 1 tablet (25 mg total) by mouth daily before breakfast.   fluorouracil (EFUDEX) 5 % cream APPLY TO AFFECTED AREA(S) AT BEDTIME AS DIRECTED (Patient taking differently: Apply 1 Application topically daily as needed (for rash).)   glucose blood test strip Use to check blood sugars up to 4 times per day.   metoprolol tartrate (LOPRESSOR) 25 MG tablet Take 0.5 tablets (12.5 mg total) by mouth 2 (two) times daily.   Multiple Vitamin (MULITIVITAMIN WITH MINERALS) TABS Take 1 tablet by mouth daily.    Saw Palmetto, Serenoa repens, 540 MG CAPS Take 540 mg by mouth in the morning.   Turmeric (QC TUMERIC COMPLEX) 500 MG CAPS Take 1 capsule by mouth in the morning.   VASCEPA 1 g capsule Take 2 capsules (2 g total) by mouth 2 (two) times daily.   zinc gluconate 50 MG tablet Take 50 mg by mouth daily.  - stopped taking metformin & rosuvastatin  Allergies  Allergen Reactions   Penicillins Other (See Comments)    Childhood allergy    SOCIAL HISTORY/FAMILY HISTORY   Reviewed in Epic:  Pertinent findings:  Social History   Tobacco Use   Smoking status: Never   Smokeless tobacco: Never  Vaping Use   Vaping Use: Never used  Substance Use Topics   Alcohol use: Yes    Comment: rare   Drug use: No   Social History   Social History Narrative   Not on file    OBJCTIVE -PE, EKG, labs   Wt Readings from Last 3 Encounters:  08/26/22 179 lb (81.2 kg)  08/08/22 181 lb 7 oz (82.3 kg)  07/08/22 186 lb 4.6 oz (84.5 kg)    Physical Exam: BP 102/62   Pulse 69   Ht '5\' 9"'$  (1.753 m)   Wt 179 lb (81.2 kg)   SpO2 95%   BMI 26.43 kg/m  Physical Exam Vitals reviewed.  Constitutional:      General: He is not in acute distress.    Appearance: Normal appearance. He is normal weight. He is not ill-appearing or toxic-appearing.  HENT:     Head: Normocephalic and atraumatic.  Neck:     Vascular: No carotid bruit or JVD.  Cardiovascular:     Rate and Rhythm: Normal rate and regular rhythm. No extrasystoles are present.    Chest Wall: PMI is not displaced.     Pulses: Normal pulses.     Heart sounds: Normal heart sounds, S1 normal and S2 normal. No murmur heard.    No friction rub. No gallop.  Pulmonary:     Effort: Pulmonary effort is normal. No respiratory distress.     Breath sounds: Normal breath sounds. No wheezing, rhonchi or rales.  Musculoskeletal:        General: No swelling. Normal range of motion.     Cervical back: Normal range of motion and neck supple.  Neurological:      General: No focal deficit present.     Mental Status: He is alert and oriented to person, place, and time.  Psychiatric:  Mood and Affect: Mood normal.        Behavior: Behavior normal.        Thought Content: Thought content normal.        Judgment: Judgment normal.     Adult ECG Report  Rate: *** ;  Rhythm: {rhythm:17366};   Narrative Interpretation: ***  Recent Labs: Reviewed Lab Results  Component Value Date   CHOL 130 07/24/2022   HDL 32 (L) 07/24/2022   LDLCALC 76 07/24/2022   TRIG 138 07/24/2022   CHOLHDL 4.1 07/24/2022   Lab Results  Component Value Date   CREATININE 0.92 07/24/2022   BUN 17 07/24/2022   NA 136 07/24/2022   K 4.5 07/24/2022   CL 104 07/24/2022   CO2 23 07/24/2022      Latest Ref Rng & Units 07/24/2022    8:31 AM 04/20/2022    2:52 AM 04/19/2022    3:45 AM  CBC  WBC 3.8 - 10.8 Thousand/uL 6.8  14.6  15.2   Hemoglobin 13.2 - 17.1 g/dL 15.2  11.4  10.5   Hematocrit 38.5 - 50.0 % 46.1  34.0  31.4   Platelets 140 - 400 Thousand/uL 266  189  161     Lab Results  Component Value Date   HGBA1C 6.5 (H) 07/24/2022   Lab Results  Component Value Date   TSH 3.869 04/16/2022    ================================================== I spent a total of ***minutes with the patient spent in direct patient consultation.  Additional time spent with chart review  / charting (studies, outside notes, etc): *** min Total Time: *** min  Current medicines are reviewed at length with the patient today.  (+/- concerns) ***  Notice: This dictation was prepared with Dragon dictation along with smart phrase technology. Any transcriptional errors that result from this process are unintentional and may not be corrected upon review.  Studies Ordered:   No orders of the defined types were placed in this encounter.  No orders of the defined types were placed in this encounter.   Patient Instructions / Medication Changes & Studies & Tests Ordered   There  are no Patient Instructions on file for this visit.     Leonie Man, MD, MS Glenetta Hew, M.D., M.S. Interventional Cardiologist  [facility]  Pager # [phone] Phone # [phone] [address] [place], [place] [address]   Thank you for choosing [facility]!!

## 2022-08-26 ENCOUNTER — Encounter: Payer: Self-pay | Admitting: Cardiology

## 2022-08-26 ENCOUNTER — Ambulatory Visit: Payer: BC Managed Care – PPO | Attending: Cardiology | Admitting: Cardiology

## 2022-08-26 VITALS — BP 102/62 | HR 69 | Ht 69.0 in | Wt 179.0 lb

## 2022-08-26 DIAGNOSIS — E781 Pure hyperglyceridemia: Secondary | ICD-10-CM | POA: Diagnosis not present

## 2022-08-26 DIAGNOSIS — I214 Non-ST elevation (NSTEMI) myocardial infarction: Secondary | ICD-10-CM

## 2022-08-26 DIAGNOSIS — I2510 Atherosclerotic heart disease of native coronary artery without angina pectoris: Secondary | ICD-10-CM | POA: Diagnosis not present

## 2022-08-26 DIAGNOSIS — T466X5A Adverse effect of antihyperlipidemic and antiarteriosclerotic drugs, initial encounter: Secondary | ICD-10-CM | POA: Insufficient documentation

## 2022-08-26 DIAGNOSIS — E785 Hyperlipidemia, unspecified: Secondary | ICD-10-CM

## 2022-08-26 DIAGNOSIS — Z951 Presence of aortocoronary bypass graft: Secondary | ICD-10-CM

## 2022-08-26 DIAGNOSIS — E1169 Type 2 diabetes mellitus with other specified complication: Secondary | ICD-10-CM | POA: Diagnosis not present

## 2022-08-26 DIAGNOSIS — M791 Myalgia, unspecified site: Secondary | ICD-10-CM

## 2022-08-26 MED ORDER — NEXLIZET 180-10 MG PO TABS: 1.0000 | ORAL_TABLET | Freq: Every day | ORAL | 3 refills | Status: DC

## 2022-08-26 NOTE — Patient Instructions (Addendum)
Medication Instructions:   Nexlizet 180/10 mg  one tablet daily   *If you need a refill on your cardiac medications before your next appointment, please call your pharmacy*   Lab Work: in 2 months  fasting  LIPID CMP If you have labs (blood work) drawn today and your tests are completely normal, you will receive your results only by: [place] (if you have MyChart) OR A paper copy in the mail If you have any lab test that is abnormal or we need to change your treatment, we will call you to review the results.   Testing/Procedures: Not needed   Follow-Up: At [facility], you and your health needs are our priority.  As part of our continuing mission to provide you with exceptional heart care, we have created designated Provider Care Teams.  These Care Teams include your primary Cardiologist (physician) and Advanced Practice Providers (APPs -  Physician Assistants and Nurse Practitioners) who all work together to provide you with the care you need, when you need it.     Your next appointment:   2 month(s)  The format for your next appointment:   In Person  Provider:   [facility] for Lipids Then, Glenetta Hew, MD will plan to see you again in 7 month(s).   Other Instruct

## 2022-08-27 ENCOUNTER — Encounter (HOSPITAL_COMMUNITY): Payer: BC Managed Care – PPO

## 2022-08-28 ENCOUNTER — Encounter: Payer: Self-pay | Admitting: Cardiology

## 2022-08-28 NOTE — Assessment & Plan Note (Signed)
A1c down to 6.5.  Did not do well with metformin.  Was switched to empagliflozin.  I explained to him that this is actually a reasonable option and has significant cardiac protection.  He is tolerating lisinopril well and his triglycerides have improved dramatically.  His total cholesterol down despite having been off of his statin for over a month by time his labs were checked.  Labs and January had an LDL of 76 with triglycerides 138.  He did not tolerate even 10 mg of rosuvastatin.  I have a feeling that he will have difficulty with any other statin.  Will try Nexlizet (provided it is covered if not, would start with Zetia).  Recheck labs in 2 months and have him follow-up with [facility] to determine further treatment options.  I did indicate to him that he probably would need to have a trial of different statins to confirm true statin myalgias before being considered for PCSK9 inhibitors.  I have a suspicion that with not be able to take only 10 mg of rosuvastatin, but other older statins would be equally is likely to cause symptoms and not be as effective as the rosuvastatin was.

## 2022-08-28 NOTE — Assessment & Plan Note (Addendum)
Severe LAD disease prior to major D1 branch that had disease in the major D1 branch as well.  The location was not very favorable for PCI given the bifurcation and extensive disease.  Referred for CABG which she tolerated well. No further angina since CABG.  Completed cardiac rehab and is now exercising on his own routinely.  Plan: BP is borderline low on current dose of amlodipine and very low-dose Lopressor. He is on ASA/Plavix as DAPT post non-STEMI.   Would like to keep this uninterrupted for 6 months, but for urgent procedures would be okay to hold 5 to 7 days preop =>  plan will be to probably convert to monotherapy at 6 months with Plavix to complete a year and then go back to aspirin Did not tolerate rosuvastatin-see discussion elsewhere/statin myalgia On empagliflozin and Vascepa.  Tolerating Continue to encourage exercise.

## 2022-08-28 NOTE — Assessment & Plan Note (Signed)
Has not indicated, he was not able to tolerate even 10 mg rosuvastatin.    Since his LDL was at 76, we may be to get away with bempedoic acid/ezetimibe (Nexlizet).  Will start that today and reassess labs in 2 months.  He has been referred to [facility] lipid clinic for further assessment.  He anticipates the need of potentially trying additional statins.

## 2022-08-28 NOTE — Assessment & Plan Note (Signed)
Triglycerides look much better on the Vascepa.  It took a while to get this prescribed for him, he was initially can be converted to Lovaza, but prior authorization went through for Vascepa.

## 2022-08-28 NOTE — Assessment & Plan Note (Signed)
He is 4 months post MI with two-vessel CABG due to LAD-D1 bifurcation lesion.  Doing remarkably well.  He recovered well from his surgery.  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  Low normal EF on echo with no heart failure symptoms.  No recurrent angina.

## 2022-08-29 ENCOUNTER — Encounter (HOSPITAL_COMMUNITY): Payer: BC Managed Care – PPO

## 2022-09-01 ENCOUNTER — Encounter (HOSPITAL_COMMUNITY): Payer: BC Managed Care – PPO

## 2022-09-03 ENCOUNTER — Encounter (HOSPITAL_COMMUNITY): Payer: BC Managed Care – PPO

## 2022-09-05 ENCOUNTER — Encounter (HOSPITAL_COMMUNITY): Payer: BC Managed Care – PPO

## 2022-09-16 ENCOUNTER — Encounter: Payer: Self-pay | Admitting: Family Medicine

## 2022-10-20 ENCOUNTER — Encounter: Payer: Self-pay | Admitting: Cardiology

## 2022-10-21 ENCOUNTER — Encounter: Payer: Self-pay | Admitting: Family Medicine

## 2022-10-21 DIAGNOSIS — Z9189 Other specified personal risk factors, not elsewhere classified: Secondary | ICD-10-CM

## 2022-10-21 DIAGNOSIS — E1122 Type 2 diabetes mellitus with diabetic chronic kidney disease: Secondary | ICD-10-CM

## 2022-10-21 DIAGNOSIS — Z951 Presence of aortocoronary bypass graft: Secondary | ICD-10-CM

## 2022-10-21 DIAGNOSIS — I2510 Atherosclerotic heart disease of native coronary artery without angina pectoris: Secondary | ICD-10-CM

## 2022-10-22 ENCOUNTER — Other Ambulatory Visit: Payer: Self-pay | Admitting: *Deleted

## 2022-10-22 DIAGNOSIS — Z951 Presence of aortocoronary bypass graft: Secondary | ICD-10-CM

## 2022-10-22 DIAGNOSIS — E1122 Type 2 diabetes mellitus with diabetic chronic kidney disease: Secondary | ICD-10-CM

## 2022-10-22 DIAGNOSIS — I2510 Atherosclerotic heart disease of native coronary artery without angina pectoris: Secondary | ICD-10-CM

## 2022-10-22 DIAGNOSIS — Z9189 Other specified personal risk factors, not elsewhere classified: Secondary | ICD-10-CM

## 2022-10-24 LAB — CBC
Hematocrit: 48.5 % (ref 37.5–51.0)
Hemoglobin: 16 g/dL (ref 13.0–17.7)
MCH: 27.4 pg (ref 26.6–33.0)
MCHC: 33 g/dL (ref 31.5–35.7)
MCV: 83 fL (ref 79–97)
Platelets: 283 10*3/uL (ref 150–450)
RBC: 5.84 x10E6/uL — ABNORMAL HIGH (ref 4.14–5.80)
RDW: 16.3 % — ABNORMAL HIGH (ref 11.6–15.4)
WBC: 7.2 10*3/uL (ref 3.4–10.8)

## 2022-10-24 LAB — LIPID PANEL
Chol/HDL Ratio: 5.9 ratio — ABNORMAL HIGH (ref 0.0–5.0)
Cholesterol, Total: 214 mg/dL — ABNORMAL HIGH (ref 100–199)
HDL: 36 mg/dL — ABNORMAL LOW (ref >39)
LDL Chol Calc (NIH): 153 mg/dL — ABNORMAL HIGH (ref 0–99)
Triglycerides: 138 mg/dL (ref 0–149)
VLDL Cholesterol Cal: 25 mg/dL (ref 5–40)

## 2022-10-24 LAB — COMPREHENSIVE METABOLIC PANEL
ALT: 20 IU/L (ref 0–44)
AST: 19 IU/L (ref 0–40)
Albumin/Globulin Ratio: 1.5 (ref 1.2–2.2)
Albumin: 4.1 g/dL (ref 3.9–4.9)
Alkaline Phosphatase: 74 IU/L (ref 44–121)
BUN/Creatinine Ratio: 18 (ref 10–24)
BUN: 18 mg/dL (ref 8–27)
Bilirubin Total: 0.4 mg/dL (ref 0.0–1.2)
CO2: 24 mmol/L (ref 20–29)
Calcium: 9.3 mg/dL (ref 8.6–10.2)
Chloride: 103 mmol/L (ref 96–106)
Creatinine, Ser: 1.02 mg/dL (ref 0.76–1.27)
Globulin, Total: 2.8 g/dL (ref 1.5–4.5)
Glucose: 118 mg/dL — ABNORMAL HIGH (ref 70–99)
Potassium: 5.2 mmol/L (ref 3.5–5.2)
Sodium: 140 mmol/L (ref 134–144)
Total Protein: 6.9 g/dL (ref 6.0–8.5)
eGFR: 82 mL/min/{1.73_m2} (ref >59)

## 2022-10-24 LAB — HEMOGLOBIN A1C
Est. average glucose Bld gHb Est-mCnc: 134 mg/dL
Hgb A1c MFr Bld: 6.3 % — ABNORMAL HIGH (ref 4.8–5.6)

## 2022-10-27 ENCOUNTER — Ambulatory Visit: Payer: BC Managed Care – PPO | Attending: Internal Medicine | Admitting: Student

## 2022-10-27 ENCOUNTER — Encounter: Payer: Self-pay | Admitting: Student

## 2022-10-27 DIAGNOSIS — E1169 Type 2 diabetes mellitus with other specified complication: Secondary | ICD-10-CM

## 2022-10-27 DIAGNOSIS — E785 Hyperlipidemia, unspecified: Secondary | ICD-10-CM

## 2022-10-27 MED ORDER — ATORVASTATIN CALCIUM 20 MG PO TABS: 20.0000 mg | ORAL_TABLET | Freq: Every day | ORAL | 3 refills | Status: DC

## 2022-10-27 NOTE — Assessment & Plan Note (Signed)
Assessment:  LDL goal: <55 mg/dl last LDLc  147  mg/dl (04/18/5746) Intolerance to moderate intensity statins  Was not using Nexlizet - wanted to try lifestyle modifications  Discussed next potential options (PCSK-9 inhibitors, bempedoic acid and inclisiran); cost, dosing efficacy, side effects  Follows healthy diet and exercise regularly ( 4-5 times per week)  Willing to try atorvastatin   Plan: Start taking Atorvastatin 40 mg daily and start taking Nexlizet 180/10 mg daily 2 weeks after staring of Atorvastatin Patient to self adjust atorvastatin dose to max tolerated dose Patient to inform us via MyChart - max tolerated therapies in 2-3 weeks Baseline Uric acid done today and will repeat in 4 weeks of starting Nexlizet  Follow up with Pharm D scheduled in 12 weeks  In future may consider PCSK9i if patient has insufficient response and/or insufficient response to the prescribed therapy

## 2022-10-27 NOTE — Progress Notes (Signed)
Patient ID: [name]                 DOB: February 15, 1956                    MRN: [id number]      HPI: Dustin Wood is a 67 y.o. male patient referred to lipid clinic by Dr. Herbie Baltimore. PMH is significant for hx of NSTEMI (03/2022) s/p CABG, CAD, T2DM.   Patient presented today for lipid clinic. He was prescribed Nexlizet and rosuvastatin but he never took Nexlizet just tried rosuvastatin 10 mg. In couple of months of taking rosuvastatin 10 mg he started experiencing joint pain and muscle cramps. He has made some major changes to his diet. Cut down on fat, carb and salt eats small portions. His A1c has improved to 6.5 and his fasting BG ~100. He does exercise regularly. He has lost 25 lbs in last few months.    Diet: low fat sugar and salt diet Better portion control.  Exercise: elliptical for 15 min  and weight training for 30 min 4 5- times per week  Family History:  mother- parkinson disease  Father -prostate and skin cancer   Social History:  Alcohol: -1 drink every few months Smoking: never  Labs: Lipid Panel     Component Value Date/Time   CHOL 214 (H) 10/23/2022 0842   TRIG 138 10/23/2022 0842   HDL 36 (L) 10/23/2022 0842   CHOLHDL 5.9 (H) 10/23/2022 0842   CHOLHDL 4.1 07/24/2022 0831   VLDL 70 (H) 04/14/2022 0220   LDLCALC 153 (H) 10/23/2022 0842   LDLCALC 76 07/24/2022 0831   LABVLDL 25 10/23/2022 0842    Past Medical History:  Diagnosis Date   Diabetes mellitus without complication ([facility])    Hyperlipidemia    Melanoma ([facility]) 04/07/2019   right fore arm MIS TX EXC   Non-ST elevation myocardial infarction (NSTEMI) ([facility]) 04/13/2022   Occlusive coronary artery disease 04/14/2022   Left Heart Cath 04/14/2022: 90% thrombotic ostial and proximal segmental LAD involving large D1.  Normal RCA and LCx.  Normal EF and EDP. => Due to location and bifurcation, referred for CABG.   SCC (squamous cell carcinoma) 06/30/2016   RIGHT DORSAL HAND CX3 5FU   SCC (squamous cell  carcinoma) 06/30/2016   LEFT HAND TX CX3 5FU   Squamous cell carcinoma of skin 12/2015   RIGHT TEMPLE CX3 5FU    Current Outpatient Medications on File Prior to Visit  Medication Sig Dispense Refill   Accu-Chek Softclix Lancets lancets Use as instructed 100 each 12   amLODipine (NORVASC) 5 MG tablet Take 1 tablet (5 mg total) by mouth daily. 90 tablet 3   ascorbic acid (VITAMIN C) 500 MG tablet Take 500 mg by mouth daily.     aspirin EC 81 MG tablet Take 1 tablet (81 mg total) by mouth daily. Swallow whole. 30 tablet 12   Bempedoic Acid-Ezetimibe (NEXLIZET) 180-10 MG TABS Take 1 tablet by mouth daily. 90 tablet 3   blood glucose meter kit and supplies KIT Dispense based on patient and insurance preference. Use up to four times daily as directed. 1 each 0   Cholecalciferol (VITAMIN D3) 50 MCG (2000 UT) TABS Take 2,000 Units by mouth in the morning.     clopidogrel (PLAVIX) 75 MG tablet Take 1 tablet (75 mg total) by mouth daily. 90 tablet 3   empagliflozin (JARDIANCE) 25 MG TABS tablet Take 1 tablet (25 mg total) by  mouth daily before breakfast. 90 tablet 3   fluorouracil (EFUDEX) 5 % cream APPLY TO AFFECTED AREA(S) AT BEDTIME AS DIRECTED (Patient taking differently: Apply 1 Application topically daily as needed (for rash).) 40 g 1   glucose blood test strip Use to check blood sugars up to 4 times per day. 100 each 12   metoprolol tartrate (LOPRESSOR) 25 MG tablet Take 0.5 tablets (12.5 mg total) by mouth 2 (two) times daily. 90 tablet 3   Multiple Vitamin (MULITIVITAMIN WITH MINERALS) TABS Take 1 tablet by mouth daily.     Saw Palmetto, Serenoa repens, 540 MG CAPS Take 540 mg by mouth in the morning.     Turmeric (QC TUMERIC COMPLEX) 500 MG CAPS Take 1 capsule by mouth in the morning.     VASCEPA 1 g capsule Take 2 capsules (2 g total) by mouth 2 (two) times daily. 360 capsule 3   zinc gluconate 50 MG tablet Take 50 mg by mouth daily.     No current facility-administered medications on file  prior to visit.    Allergies  Allergen Reactions   Penicillins Other (See Comments)    Childhood allergy    Assessment/Plan:  1. Hyperlipidemia -  Problem  Hyperlipidemia Associated With Type 2 Diabetes Mellitus   Current Medications: none Intolerances: Crestor 10 mg - muscle cramps and joint pain  Risk Factors: hx of NSTEMI (03/2022) s/p CABG, CAD, T2DM LDL goal: <55 mg/dl    Hyperlipidemia associated with type 2 diabetes mellitus Assessment:  LDL goal: <55 mg/dl last LDLc  664  mg/dl (40/34/7425) Intolerance to moderate intensity statins  Was not using Nexlizet - wanted to try lifestyle modifications  Discussed next potential options (PCSK-9 inhibitors, bempedoic acid and inclisiran); cost, dosing efficacy, side effects  Follows healthy diet and exercise regularly ( 4-5 times per week)  Willing to try atorvastatin   Plan: Start taking Atorvastatin 40 mg daily and start taking Nexlizet 180/10 mg daily 2 weeks after staring of Atorvastatin Patient to self adjust atorvastatin dose to max tolerated dose Patient to inform us via MyChart - max tolerated therapies in 2-3 weeks Baseline Uric acid done today and will repeat in 4 weeks of starting Nexlizet  Follow up with Pharm D scheduled in 12 weeks  In future may consider PCSK9i if patient has insufficient response and/or insufficient response to the prescribed therapy     Thank you,  Carmela Hurt, Pharm.D [facility] Division of [facility] [address] [place], [place] [address]  Phone: [phone]; Fax: [phone]

## 2022-10-27 NOTE — Patient Instructions (Signed)
Your Results:             Your most recent labs Goal  Total Cholesterol 214 < 200  Triglycerides 138 < 150  HDL (happy/good cholesterol) 36 > 40  LDL (lousy/bad cholesterol 153 < 55   Medication changes: Start taking Atorvastatin 40 mg daily and start taking Nexlizet 180/10 mg daily 2 weeks after staring of Atorvastatin. Baseline Uric acid lab is sue today and in 1 month of starting Nexlizet.   Carmela Hurt, Pharm.D  [facility] of [facility] [address], [place], [place] [address]  Phone: [phone]; Fax: [phone]

## 2022-10-28 ENCOUNTER — Encounter: Payer: Self-pay | Admitting: Cardiology

## 2022-10-28 LAB — URIC ACID: Uric Acid: 5 mg/dL (ref 3.8–8.4)

## 2022-10-29 NOTE — Telephone Encounter (Signed)
I have no idea what Pritikin is.   If he does not want to pay for Cardiac Rehab -- then don't go -- I have no control over cost.  I recommend Cardiac Rehab b/c the data supports routine referral to prevent adverse outcomes.    Many people decide not to go after the initial visit or 2.but most continue & do well.  Bryan Lemma, MD

## 2022-11-10 NOTE — Telephone Encounter (Signed)
Spoke to patient Dr.Harding's advice given. 

## 2022-11-13 ENCOUNTER — Telehealth (HOSPITAL_COMMUNITY): Payer: Self-pay

## 2022-11-13 NOTE — Telephone Encounter (Signed)
Kim from [facility] called in regards to a complaint the pt made in receiving a cardiac rehab bill. Pt stated that he was told he wouldn't be charged. Pt started the program in Dec 2023 at that time pt insurance was checked in Nov 2023 and pt had met his deductible and out-of-pocket which he was covered 100% at that time. Insurance starts over every year the first of the year and I advised Selena Batten that the pt is responsible for knowing his benefits and when they start over. Pt signed an insurance form and Kim from [facility] wanted a copy I Marcine Matar that we would have to get permission to send that information.

## 2022-11-14 ENCOUNTER — Encounter: Payer: Self-pay | Admitting: Cardiology

## 2023-01-26 ENCOUNTER — Encounter: Payer: Self-pay | Admitting: Family Medicine

## 2023-01-26 ENCOUNTER — Ambulatory Visit: Payer: BC Managed Care – PPO | Attending: Cardiology | Admitting: Pharmacist

## 2023-01-26 ENCOUNTER — Encounter: Payer: Self-pay | Admitting: Cardiology

## 2023-01-26 ENCOUNTER — Encounter: Payer: Self-pay | Admitting: Pharmacist

## 2023-01-26 DIAGNOSIS — I214 Non-ST elevation (NSTEMI) myocardial infarction: Secondary | ICD-10-CM

## 2023-01-26 DIAGNOSIS — I2510 Atherosclerotic heart disease of native coronary artery without angina pectoris: Secondary | ICD-10-CM

## 2023-01-26 DIAGNOSIS — T466X5A Adverse effect of antihyperlipidemic and antiarteriosclerotic drugs, initial encounter: Secondary | ICD-10-CM

## 2023-01-26 DIAGNOSIS — G720 Drug-induced myopathy: Secondary | ICD-10-CM | POA: Insufficient documentation

## 2023-01-26 NOTE — Progress Notes (Signed)
Patient ID: Dustin Wood                 DOB: Oct 20, 1955                    MRN: [id number]     HPI: Dustin Wood is a 67 y.o. male patient referred to lipid clinic by Dr Herbie Baltimore. PMH is significant for NSTEMI, CAD, T2DM, CABG, HLD, and statin intolerance.  Patient seen 3 months ago for lipid clinic appointment. At that time he had discontinued rosuvastatin due to myalgias. Had been prescribed Nexlizet but had not started it yet. Was prescribed atorvastatin 20mg  with plans to slowly titrate up and to eventually start Nexlizet. Remains on atorvastatin 20mg . Has not started Nexlizet yet.  Blood sugar well controlled. Reports fasting readings typically less than 120. Walks often at work. 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   Needs updated lab work   Current Medications:  Atorvastatin 20mg   Intolerances:  Rosuvastatin 10mg   Risk Factors:  CAD T2DM Hx of NSTEMI  LDL goal: <55  Diet: low sugar, low salt.  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  Normal RCA and LCx.  Normal EF and EDP. => Due to location and bifurcation, referred for CABG.   SCC (squamous cell carcinoma) 06/30/2016   RIGHT DORSAL HAND CX3 5FU   SCC (squamous cell carcinoma) 06/30/2016   LEFT HAND TX CX3 5FU   Squamous cell carcinoma of skin 12/2015   RIGHT TEMPLE CX3 5FU    Current Outpatient Medications on File Prior to Visit  Medication Sig Dispense Refill   Accu-Chek Softclix Lancets lancets Use as instructed 100 each 12   amLODipine (NORVASC) 5 MG tablet Take 1 tablet (5 mg total) by mouth daily. 90 tablet 3   ascorbic acid (VITAMIN C) 500 MG tablet Take 500  mg by mouth daily.     aspirin EC 81 MG tablet Take 1 tablet (81 mg total) by mouth daily. Swallow whole. 30 tablet 12   atorvastatin (LIPITOR) 20 MG tablet Take 1 tablet (20 mg total) by mouth daily. 90 tablet 3   Bempedoic Acid-Ezetimibe (NEXLIZET) 180-10 MG TABS Take 1 tablet by mouth daily. 90 tablet 3   blood glucose meter kit and supplies KIT Dispense based on patient and insurance preference. Use up to four times daily as directed. 1 each 0   Cholecalciferol (VITAMIN D3) 50 MCG (2000 UT) TABS Take 2,000 Units by mouth in the morning.     clopidogrel (PLAVIX) 75 MG tablet Take 1 tablet (75 mg total) by mouth daily. 90 tablet 3   empagliflozin (JARDIANCE) 25 MG TABS tablet Take 1 tablet (25 mg total) by mouth daily before breakfast. 90 tablet 3   fluorouracil (EFUDEX) 5 % cream APPLY TO AFFECTED AREA(S) AT BEDTIME AS DIRECTED (Patient taking differently: Apply 1 Application topically daily as needed (for rash).) 40 g 1   glucose blood test strip Use to check blood sugars up to 4 times per day. 100 each 12   metoprolol tartrate (LOPRESSOR) 25 MG tablet Take 0.5 tablets (12.5 mg total) by mouth 2 (two) times daily. 90 tablet 3  Multiple Vitamin (MULITIVITAMIN WITH MINERALS) TABS Take 1 tablet by mouth daily.     Saw Palmetto, Serenoa repens, 540 MG CAPS Take 540 mg by mouth in the morning.     Turmeric (QC TUMERIC COMPLEX) 500 MG CAPS Take 1 capsule by mouth in the morning.     VASCEPA 1 g capsule Take 2 capsules (2 g total) by mouth 2 (two) times daily. 360 capsule 3   zinc gluconate 50 MG tablet Take 50 mg by mouth daily.     No current facility-administered medications on file prior to visit.    Allergies  Allergen Reactions   Penicillins Other (See Comments)    Childhood allergy    Assessment/Plan:  1. Hyperlipidemia -  Patient last LDL 153 which is above goal of <55. However, needs updated lab work since starting atorvastatin. Will place lipid panel and check LFTs.. If above  goal, will increase atorvastatin to 40mg . Patient will have drawn at PCP office.  Continue atorvastatin 20mg  daily Recheck lipid panel Check hepatic function panel F/U as needed  Laural Golden, PharmD, BCACP, CDCES, CPP [address], [address] [place], [place], [address] Phone: [phone], Fax: [phone]

## 2023-01-26 NOTE — Patient Instructions (Addendum)
We would like your LDL (bad cholesterol) to be less than 55  Please continue your atorvastatin 20mg  once a day  I have placed lab orders for you. Please have drawn on an empty stomach  If your LDL is still above goal we will increase your atorvastatin to 40mg   Continue to focus on vegetables, lean proteins, healthy fats, and whole grains  Try to increase your physical activity to at least 30 minutes a day at least 5 days a week  Please call or message with any questions  Laural Golden, PharmD, BCACP, CDCES, CPP [address], [address] [place], [place], [address] Phone: [phone], Fax: [phone]

## 2023-01-28 ENCOUNTER — Other Ambulatory Visit: Payer: BC Managed Care – PPO

## 2023-01-28 DIAGNOSIS — M791 Myalgia, unspecified site: Secondary | ICD-10-CM

## 2023-01-28 DIAGNOSIS — E119 Type 2 diabetes mellitus without complications: Secondary | ICD-10-CM

## 2023-01-28 DIAGNOSIS — I2510 Atherosclerotic heart disease of native coronary artery without angina pectoris: Secondary | ICD-10-CM

## 2023-01-28 DIAGNOSIS — L980 Pyogenic granuloma: Secondary | ICD-10-CM

## 2023-01-28 DIAGNOSIS — E781 Pure hyperglyceridemia: Secondary | ICD-10-CM

## 2023-01-28 DIAGNOSIS — T466X5A Adverse effect of antihyperlipidemic and antiarteriosclerotic drugs, initial encounter: Secondary | ICD-10-CM

## 2023-01-28 DIAGNOSIS — E785 Hyperlipidemia, unspecified: Secondary | ICD-10-CM

## 2023-01-28 DIAGNOSIS — Z79899 Other long term (current) drug therapy: Secondary | ICD-10-CM

## 2023-01-28 NOTE — Addendum Note (Signed)
Addended by: Venia Carbon K on: 01/28/2023 08:41 AM   Modules accepted: Orders

## 2023-01-29 LAB — BASIC METABOLIC PANEL
BUN: 17 mg/dL (ref 7–25)
CO2: 24 mmol/L (ref 20–32)
Calcium: 9.3 mg/dL (ref 8.6–10.3)
Chloride: 106 mmol/L (ref 98–110)
Creat: 1.03 mg/dL (ref 0.70–1.35)
Glucose, Bld: 125 mg/dL — ABNORMAL HIGH (ref 65–99)
Potassium: 4.7 mmol/L (ref 3.5–5.3)
Sodium: 140 mmol/L (ref 135–146)

## 2023-01-29 LAB — HEPATIC FUNCTION PANEL
AG Ratio: 1.6 (calc) (ref 1.0–2.5)
ALT: 21 U/L (ref 9–46)
AST: 17 U/L (ref 10–35)
Albumin: 4.2 g/dL (ref 3.6–5.1)
Alkaline phosphatase (APISO): 60 U/L (ref 35–144)
Bilirubin, Direct: 0.1 mg/dL (ref 0.0–0.2)
Globulin: 2.6 g/dL (calc) (ref 1.9–3.7)
Indirect Bilirubin: 0.4 mg/dL (calc) (ref 0.2–1.2)
Total Bilirubin: 0.5 mg/dL (ref 0.2–1.2)
Total Protein: 6.8 g/dL (ref 6.1–8.1)

## 2023-01-29 LAB — CBC WITH DIFFERENTIAL/PLATELET
Absolute Monocytes: 656 cells/uL (ref 200–950)
Basophils Absolute: 24 cells/uL (ref 0–200)
Basophils Relative: 0.3 %
Eosinophils Absolute: 229 cells/uL (ref 15–500)
Eosinophils Relative: 2.9 %
HCT: 48.8 % (ref 38.5–50.0)
Hemoglobin: 16.2 g/dL (ref 13.2–17.1)
Lymphs Abs: 1904 cells/uL (ref 850–3900)
MCH: 28.2 pg (ref 27.0–33.0)
MCHC: 33.2 g/dL (ref 32.0–36.0)
MCV: 85 fL (ref 80.0–100.0)
MPV: 9.7 fL (ref 7.5–12.5)
Monocytes Relative: 8.3 %
Neutro Abs: 5088 cells/uL (ref 1500–7800)
Neutrophils Relative %: 64.4 %
Platelets: 264 10*3/uL (ref 140–400)
RBC: 5.74 10*6/uL (ref 4.20–5.80)
RDW: 13.3 % (ref 11.0–15.0)
Total Lymphocyte: 24.1 %
WBC: 7.9 10*3/uL (ref 3.8–10.8)

## 2023-01-29 LAB — HEMOGLOBIN A1C
Hgb A1c MFr Bld: 6.3 % of total Hgb — ABNORMAL HIGH (ref <5.7)
Mean Plasma Glucose: 134 mg/dL
eAG (mmol/L): 7.4 mmol/L

## 2023-01-29 LAB — LIPID PANEL
Cholesterol: 123 mg/dL (ref <200)
HDL: 41 mg/dL (ref >=40)
LDL Cholesterol (Calc): 67 mg/dL (calc)
Non-HDL Cholesterol (Calc): 82 mg/dL (calc) (ref <130)
Total CHOL/HDL Ratio: 3 (calc) (ref <5.0)
Triglycerides: 71 mg/dL (ref <150)

## 2023-03-14 ENCOUNTER — Other Ambulatory Visit: Payer: Self-pay | Admitting: Student

## 2023-03-15 ENCOUNTER — Other Ambulatory Visit: Payer: Self-pay | Admitting: Cardiology

## 2023-03-27 ENCOUNTER — Ambulatory Visit: Payer: BC Managed Care – PPO | Attending: Cardiology | Admitting: Cardiology

## 2023-03-27 ENCOUNTER — Encounter: Payer: Self-pay | Admitting: Cardiology

## 2023-03-27 ENCOUNTER — Other Ambulatory Visit: Payer: Self-pay

## 2023-03-27 VITALS — BP 108/62 | HR 67 | Ht 69.0 in | Wt 179.0 lb

## 2023-03-27 DIAGNOSIS — E1169 Type 2 diabetes mellitus with other specified complication: Secondary | ICD-10-CM | POA: Diagnosis not present

## 2023-03-27 DIAGNOSIS — M791 Myalgia, unspecified site: Secondary | ICD-10-CM

## 2023-03-27 DIAGNOSIS — T466X5A Adverse effect of antihyperlipidemic and antiarteriosclerotic drugs, initial encounter: Secondary | ICD-10-CM

## 2023-03-27 DIAGNOSIS — T466X5D Adverse effect of antihyperlipidemic and antiarteriosclerotic drugs, subsequent encounter: Secondary | ICD-10-CM

## 2023-03-27 DIAGNOSIS — I2510 Atherosclerotic heart disease of native coronary artery without angina pectoris: Secondary | ICD-10-CM | POA: Diagnosis not present

## 2023-03-27 DIAGNOSIS — Z951 Presence of aortocoronary bypass graft: Secondary | ICD-10-CM

## 2023-03-27 DIAGNOSIS — I444 Left anterior fascicular block: Secondary | ICD-10-CM | POA: Diagnosis not present

## 2023-03-27 DIAGNOSIS — I214 Non-ST elevation (NSTEMI) myocardial infarction: Secondary | ICD-10-CM | POA: Diagnosis not present

## 2023-03-27 DIAGNOSIS — E785 Hyperlipidemia, unspecified: Secondary | ICD-10-CM

## 2023-03-27 DIAGNOSIS — R9431 Abnormal electrocardiogram [ECG] [EKG]: Secondary | ICD-10-CM | POA: Diagnosis not present

## 2023-03-27 NOTE — Patient Instructions (Signed)
Medication Instructions:    Complete the current bottle of Clopidogrel once  complete stop taking and  Start taking Aspirin 81 mg daily    *If you need a refill on your cardiac medications before your next appointment, please call your pharmacy*   Lab Work:  Not needed   Testing/Procedures:  Not needed  Follow-Up: At [facility], you and your health needs are our priority.  As part of our continuing mission to provide you with exceptional heart care, we have created designated Provider Care Teams.  These Care Teams include your primary Cardiologist (physician) and Advanced Practice Providers (APPs -  Physician Assistants and Nurse Practitioners) who all work together to provide you with the care you need, when you need it.     Your next appointment:   6 month(s)  The format for your next appointment:   In Person  Provider:   Edd Fabian, FNP    Then, Bryan Lemma, MD will plan to see you again in 12 month(s).

## 2023-03-27 NOTE — Progress Notes (Unsigned)
Cardiology Office Note:  .   Date:  03/28/2023  ID:  Dustin Wood, DOB 03-18-1956, MRN [id number] PCP: Donita Brooks, MD  [facility] Providers Cardiologist:  Bryan Lemma, MD     Chief Complaint  Patient presents with   Follow-up    6 months.   Coronary Artery Disease    No active angina.   Hyperlipidemia    Did not start Nexlizet    History of Present Illness: .     Dustin Wood is a 67 y.o. male with a pertinent PMH noted below who presents here for 27-month follow-up at the request of Donita Brooks, MD.  04/13/2022: Non-STEMI => Cardiac cath 04/14/2022 revealed LAD-D1 bifurcation lesion => referred for CABG CABG 04/09/2022-all arterial: LIMA-LAD, L rad-D1 HTN, HLD, DM-2  Dustin Wood was last seen by me on August 26, 2022 as a 33-month post MI/post CABG follow-up.  He was doing very well.  Working out at [employer] 3 to 4 days a week.  Doing treadmill as well as stairstepper and NuStep.  Also some light weights.  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  Occasional cramping if he overdoes it but otherwise doing fine.  No recurrent cardiac symptoms.  BP borderline low but no dizziness.His labs showed his A1c C was down to 6.5 on combination of empagliflozin and metformin.  Had lost about 5 pounds.  Had not tolerated rosuvastatin.  I recommended starting Nexlizet.  Referred to [facility] for Pharmacist-run Lipid Clinic.    He was just seen by Mr. Delma Officer, [facility] in our [facility] clinic on 01/26/2023 for lipid management.  He had had significant myalgias on rosuvastatin.  He had not yet started Nexlizet.  He was therefore prescribed 20 mg atorvastatin with plans to slowly titrate up and then start Nexlizet.  At this visit he had started the atorvastatin but had not yet started the Nexlizet.  Noted that his last LDL was 153 but needed updated labs.  Lipid panel ordered.  Recommendation if not at goal was to increase atorvastatin to 40 mg with the next to ensure that he is taking Nexlizet.     Subjective   INTERVAL HISTORY Dustin Wood returns here for routine follow-up stating that he is doing fairly well.  This is essentially 1 year out from his MI and he is doing remarkably well.  He got out of the habit of doing his exercises at the gym because he has been taking care of his wife who recently had surgery.  He just now started back his exercises so he is a little bit deconditioned, noting some exertional dyspnea but has not had any issues getting back into it.  No chest pain or pressure with rest or exertion.  No claudication.  No syncope or near syncope. No PND orthopnea edema.  He never did start taking the Nexlizet stating that he was hoping to not have to take any more medicines and was happy with the labs from July showing LDL of 70.  He is reluctant to take additional medicines at this point.  He does seem to be tolerating his Jardiance for diabetes.  ROS:  Cardiovascular ROS: no chest pain or dyspnea on exertion negative for - edema, irregular heartbeat, orthopnea, palpitations, paroxysmal nocturnal dyspnea, rapid heart rate, shortness of breath, or lightheadedness, dizziness or wooziness, syncope/near syncope or TIA/amaurosis fugax or claudication Review of Systems - Negative except losing a little round of his exercise from taking  a break to take care of his wife but otherwise is doing fine.  No melena, hematochezia, hematuria or epistaxis.  No recent illnesses.  Current Meds  Medication Sig   Accu-Chek Softclix Lancets lancets Use as instructed   amLODipine (NORVASC) 5 MG tablet Take 1 tablet (5 mg total) by mouth daily.   ascorbic acid (VITAMIN C) 500 MG tablet Take 500 mg by mouth daily.   atorvastatin (LIPITOR) 20 MG tablet Take 1 tablet (20 mg total) by mouth daily.   blood glucose meter kit and supplies KIT Dispense based on patient and insurance preference. Use up to four times daily as directed.   Cholecalciferol (VITAMIN D3) 50 MCG (2000 UT) TABS Take 2,000  Units by mouth in the morning.   clopidogrel (PLAVIX) 75 MG tablet TAKE 1 TABLET BY MOUTH EVERY DAY   empagliflozin (JARDIANCE) 25 MG TABS tablet Take 1 tablet (25 mg total) by mouth daily before breakfast.   fluorouracil (EFUDEX) 5 % cream APPLY TO AFFECTED AREA(S) AT BEDTIME AS DIRECTED (Patient taking differently: Apply 1 Application topically daily as needed (for rash).)   glucose blood test strip Use to check blood sugars up to 4 times per day.   metoprolol tartrate (LOPRESSOR) 25 MG tablet TAKE 0.5 TABLETS BY MOUTH 2 TIMES DAILY.   Multiple Vitamin (MULITIVITAMIN WITH MINERALS) TABS Take 1 tablet by mouth daily.   Saw Palmetto, Serenoa repens, 540 MG CAPS Take 540 mg by mouth in the morning.   Turmeric (QC TUMERIC COMPLEX) 500 MG CAPS Take 1 capsule by mouth in the morning.   VASCEPA 1 g capsule TAKE 2 CAPSULES BY MOUTH 2 TIMES DAILY.   zinc gluconate 50 MG tablet Take 50 mg by mouth daily.      Objective   Studies Reviewed: Marland Kitchen   EKG Interpretation Date/Time:  Friday March 27 2023 08:46:25 EDT Ventricular Rate:  67 PR Interval:  178 QRS Duration:  90 QT Interval:  380 QTC Calculation: 401 R Axis:   -64  Text Interpretation: Normal sinus rhythm Left anterior fascicular block Nonspecific ST abnormality When compared with ECG of 18-Apr-2022 06:36, Left anterior fascicular block is now Present TWI in Septal leads NO LONGER PRESENT Confirmed by Bryan Lemma ([id number]) on 03/27/2023 8:55:47 AM   TTE 04/14/2022: (Non-STEMI) EF 50 to 55% (low normal).  Mild HK basal to mid inferior and inferolateral wall.  Concordant septal motion.  GR 1 DD.  Normal LVEDP.  Normal RV size and function.  Normal RV P.  Normal MV and AoV.   Left Heart Cath 04/14/2022: 90% thrombotic ostial and proximal segmental LAD involving large D1.  Normal RCA and LCx.  Normal EF and EDP. => Due to location and bifurcation, referred for CABG.  Risk Assessment/Calculations:            Physical Exam:   VS:  BP 108/62  (BP Location: Left Arm, Patient Position: Sitting, Cuff Size: Normal)   Pulse 67   Ht 5\' 9"  (1.753 m)   Wt 179 lb (81.2 kg)   BMI 26.43 kg/m    Wt Readings from Last 3 Encounters:  03/27/23 179 lb (81.2 kg)  08/26/22 179 lb (81.2 kg)  08/08/22 181 lb 7 oz (82.3 kg)    GEN: Well nourished, well developed in no acute distress; well-groomed.  Appearing. NECK: No JVD; No carotid bruits CARDIAC: Normal S1, S2; RRR, no murmurs, rubs, gallops RESPIRATORY:  Clear to auscultation without rales, wheezing or rhonchi ; nonlabored, good air movement. ABDOMEN:  Soft, non-tender, non-distended EXTREMITIES:  No edema; No deformity   EKG Interpretation Date/Time:  Friday March 27 2023 08:46:25 EDT Ventricular Rate:  67 PR Interval:  178 QRS Duration:  90 QT Interval:  380 QTC Calculation: 401 R Axis:   -64  Text Interpretation: Normal sinus rhythm Left anterior fascicular block Nonspecific ST abnormality When compared with ECG of 18-Apr-2022 06:36, Left anterior fascicular block is now Present TWI in Septal leads NO LONGER PRESENT Confirmed by Bryan Lemma ([id number]) on 03/27/2023 8:55:47 AM    ASSESSMENT AND PLAN: .    Problem List Items Addressed This Visit       Cardiology Problems   Coronary artery disease involving native coronary artery without angina pectoris - Primary (Chronic)    Continues to do remarkably well just about 1 year out from his MI with CABG.  He had severe LAD disease near ostial involving the major diagonal branch. He asked about medications that he is on, indicated that the amlodipine that he is on is actually to help prevent spasm of the radial artery graft to the diagonal branch.  Over time, that can probably switched out for an ARB, but for now states he is doing well which show 6.  Medications stable.  Plan: Continue very low-dose Lopressor 1/2 tablet twice daily (low threshold to convert from twice daily dosing of Lopressor to once daily Toprol 25 mg) Continue  amlodipine for reasons indicated Beginning October 1, stop Plavix and restart aspirin. Seems to be tolerating atorvastatin 20 mg plus separate with LDL of 70.  We talked about starting Nexlizet and he would like to hold off on a new medication.  He understands the thought process but just has never been 1 to take medicines. Continue Jardiance.      Relevant Orders   EKG 12-Lead   Hyperlipidemia associated with type 2 diabetes mellitus (HCC) (Chronic)    Goal LDL should be less than 55.  Currently at 51 which is the intermittent goal.  He has not been using Nexlizet and wants to continue to try lifestyle modification.  Not interested in trying new medicines at this time.  Will hold off for now and just simply continue the lisinopril plus to 20 mg atorvastatin.  He did not tolerate higher dose of statin and did not tolerate rosuvastatin.  Since he has not started Nexlizet, I do not really see the reason for doing the recesses of labs.  We can discuss again in follow-up based on what routine labs show..      NSTEMI (non-ST elevated myocardial infarction) (HCC) (Chronic)    Almost 1 year out from MI.  Would convert from DAPT to aspirin alone as of October 1.        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  Seems to be doing okay on low-dose atorvastatin.      S/P CABG x 2 (Chronic)    Almost 1 year out from CABG x 2 for severe proximal LAD disease with LIMA to LAD and L Rad-Diag1.  Back to full level of activity with no untoward symptoms.  Continue amlodipine for radial artery patency.  Continue beta-blocker. As long as there are no symptoms, will hold off on further ischemic evaluation.               Dispo: Return in about 6 months (around 09/24/2023) for Alternate 6 month follow-up with APP & MD.  Total time spent: 24 min  spent with patient + 14 min spent charting = 38 min     Signed, Marykay Lex, MD, MS Bryan Lemma, M.D.,  M.S. Interventional Cardiologist  [facility]  Pager # [phone] Phone # [phone] [address] [place], [place] [address]

## 2023-03-28 ENCOUNTER — Encounter: Payer: Self-pay | Admitting: Cardiology

## 2023-03-28 NOTE — Assessment & Plan Note (Signed)
Almost 1 year out from MI.  Would convert from DAPT to aspirin alone as of October 1.

## 2023-03-28 NOTE — Assessment & Plan Note (Signed)
Did not tolerate rosuvastatin.  Seems to be doing okay on low-dose atorvastatin.

## 2023-03-28 NOTE — Assessment & Plan Note (Signed)
Continues to do remarkably well just about 1 year out from his MI with CABG.  He had severe LAD disease near ostial involving the major diagonal branch. He asked about medications that he is on, indicated that the amlodipine that he is on is actually to help prevent spasm of the radial artery graft to the diagonal branch.  Over time, that can probably switched out for an ARB, but for now states he is doing well which show 6.  Medications stable.  Plan: Continue very low-dose Lopressor 1/2 tablet twice daily (low threshold to convert from twice daily dosing of Lopressor to once daily Toprol 25 mg) Continue amlodipine for reasons indicated Beginning October 1, stop Plavix and restart aspirin. Seems to be tolerating atorvastatin 20 mg plus separate with LDL of 70.  We talked about starting Nexlizet and he would like to hold off on a new medication.  He understands the thought process but just has never been 1 to take medicines. Continue Jardiance.

## 2023-03-28 NOTE — Assessment & Plan Note (Signed)
Goal LDL should be less than 55.  Currently at 64 which is the intermittent goal.  He has not been using Nexlizet and wants to continue to try lifestyle modification.  Not interested in trying new medicines at this time.  Will hold off for now and just simply continue the lisinopril plus to 20 mg atorvastatin.  He did not tolerate higher dose of statin and did not tolerate rosuvastatin.  Since he has not started Nexlizet, I do not really see the reason for doing the recesses of labs.  We can discuss again in follow-up based on what routine labs show.Marland Kitchen

## 2023-03-28 NOTE — Assessment & Plan Note (Signed)
Almost 1 year out from CABG x 2 for severe proximal LAD disease with LIMA to LAD and L Rad-Diag1.  Back to full level of activity with no untoward symptoms.  Continue amlodipine for radial artery patency.  Continue beta-blocker. As long as there are no symptoms, will hold off on further ischemic evaluation.

## 2023-04-24 ENCOUNTER — Other Ambulatory Visit: Payer: Self-pay | Admitting: Family Medicine

## 2023-04-24 ENCOUNTER — Other Ambulatory Visit: Payer: Self-pay | Admitting: Cardiology

## 2023-04-24 NOTE — Telephone Encounter (Signed)
Prescription Request  04/24/2023  LOV: Visit date not found  What is the name of the medication or equipment? empagliflozin (JARDIANCE) 25 MG TABS tablet   Have you contacted your pharmacy to request a refill? Yes   Which pharmacy would you like this sent to?  [id number] - [place], [place] - [address] [address] Phone: [phone] Fax: [phone]    Patient notified that their request is being sent to the clinical staff for review and that they should receive a response within 2 business days.   Please advise at [facility] [phone]

## 2023-04-24 NOTE — Telephone Encounter (Signed)
Requested medication (s) are due for refill today: yes  Requested medication (s) are on the active medication list: yes  Last refill:  04/22/22 #90 3 RF  Future visit scheduled: no  Notes to clinic:  Sent pt MyChart message to call and make appt for CPE and called pt's number and LM on VM to call to schedule. Sending this refill request over since it has been so long since he has been in office   Requested Prescriptions  Pending Prescriptions Disp Refills   empagliflozin (JARDIANCE) 25 MG TABS tablet 90 tablet 3    Sig: Take 1 tablet (25 mg total) by mouth daily before breakfast.     Endocrinology:  Diabetes - SGLT2 Inhibitors Failed - 04/24/2023 10:08 AM      Failed - Valid encounter within last 6 months    Recent Outpatient Visits           2 years ago Close exposure to COVID-19 virus   [facility] Pickard, Priscille Heidelberg, MD   5 years ago General medical exam   [facility] Donita Brooks, MD   6 years ago Bacterial respiratory infection   [facility] Dorena Bodo, PA-C              Passed - Cr in normal range and within 360 days    Creat  Date Value Ref Range Status  01/28/2023 1.03 0.70 - 1.35 mg/dL Final   Creatinine, Urine  Date Value Ref Range Status  07/24/2022 57 20 - 320 mg/dL Final         Passed - HBA1C is between 0 and 7.9 and within 180 days    Hgb A1c MFr Bld  Date Value Ref Range Status  01/28/2023 6.3 (H) <5.7 % of total Hgb Final    Comment:    For someone without known diabetes, a hemoglobin  A1c value between 5.7% and 6.4% is consistent with prediabetes and should be confirmed with a  follow-up test. . For someone with known diabetes, a value <7% indicates that their diabetes is well controlled. A1c targets should be individualized based on duration of diabetes, age, comorbid conditions, and other considerations. . This assay result is consistent with an increased risk of  diabetes. . Currently, no consensus exists regarding use of hemoglobin A1c for diagnosis of diabetes for children. .          Passed - eGFR in normal range and within 360 days    GFR, Est African American  Date Value Ref Range Status  06/22/2020 95 > OR = 60 mL/min/1.92m2 Final   GFR, Est Non African American  Date Value Ref Range Status  06/22/2020 82 > OR = 60 mL/min/1.87m2 Final   GFR, Estimated  Date Value Ref Range Status  04/20/2022 >60 >60 mL/min Final    Comment:    (NOTE) Calculated using the CKD-EPI Creatinine Equation (2021)    eGFR  Date Value Ref Range Status  10/23/2022 82 >59 mL/min/1.73 Final

## 2023-04-24 NOTE — Telephone Encounter (Signed)
Requested medications are due for refill today.  yes  Requested medications are on the active medications list.  yes  Last refill. 04/22/2022 #90 3 rf  Future visit scheduled.   no  Notes to clinic.  Pt last seen in office 04/22/2022. Pt is more than 3 months overdue for OV.Unable to give courtesy refill. PT needs a CPE.    Requested Prescriptions  Pending Prescriptions Disp Refills   JARDIANCE 25 MG TABS tablet [Pharmacy Med Name: JARDIANCE 25 MG TABLET] 90 tablet 3    Sig: TAKE 1 TABLET BY MOUTH DAILY BEFORE BREAKFAST.     Endocrinology:  Diabetes - SGLT2 Inhibitors Failed - 04/24/2023  5:43 AM      Failed - Valid encounter within last 6 months    Recent Outpatient Visits           2 years ago Close exposure to COVID-19 virus   [facility] Pickard, Priscille Heidelberg, MD   5 years ago General medical exam   [facility] Donita Brooks, MD   6 years ago Bacterial respiratory infection   [facility] Dorena Bodo, PA-C              Passed - Cr in normal range and within 360 days    Creat  Date Value Ref Range Status  01/28/2023 1.03 0.70 - 1.35 mg/dL Final   Creatinine, Urine  Date Value Ref Range Status  07/24/2022 57 20 - 320 mg/dL Final         Passed - HBA1C is between 0 and 7.9 and within 180 days    Hgb A1c MFr Bld  Date Value Ref Range Status  01/28/2023 6.3 (H) <5.7 % of total Hgb Final    Comment:    For someone without known diabetes, a hemoglobin  A1c value between 5.7% and 6.4% is consistent with prediabetes and should be confirmed with a  follow-up test. . For someone with known diabetes, a value <7% indicates that their diabetes is well controlled. A1c targets should be individualized based on duration of diabetes, age, comorbid conditions, and other considerations. . This assay result is consistent with an increased risk of diabetes. . Currently, no consensus exists regarding use of hemoglobin A1c  for diagnosis of diabetes for children. .          Passed - eGFR in normal range and within 360 days    GFR, Est African American  Date Value Ref Range Status  06/22/2020 95 > OR = 60 mL/min/1.76m2 Final   GFR, Est Non African American  Date Value Ref Range Status  06/22/2020 82 > OR = 60 mL/min/1.53m2 Final   GFR, Estimated  Date Value Ref Range Status  04/20/2022 >60 >60 mL/min Final    Comment:    (NOTE) Calculated using the CKD-EPI Creatinine Equation (2021)    eGFR  Date Value Ref Range Status  10/23/2022 82 >59 mL/min/1.73 Final

## 2023-04-25 ENCOUNTER — Encounter: Payer: Self-pay | Admitting: Family Medicine

## 2023-04-27 ENCOUNTER — Other Ambulatory Visit: Payer: Self-pay

## 2023-04-27 DIAGNOSIS — E119 Type 2 diabetes mellitus without complications: Secondary | ICD-10-CM

## 2023-04-27 MED ORDER — EMPAGLIFLOZIN 25 MG PO TABS
25.0000 mg | ORAL_TABLET | Freq: Every day | ORAL | 1 refills | Status: DC
Start: 2023-04-27 — End: 2023-05-15

## 2023-05-15 ENCOUNTER — Ambulatory Visit: Payer: BC Managed Care – PPO | Admitting: Family Medicine

## 2023-05-15 ENCOUNTER — Encounter: Payer: Self-pay | Admitting: Family Medicine

## 2023-05-15 VITALS — BP 100/70 | HR 74 | Temp 98.5°F | Ht 69.0 in | Wt 179.0 lb

## 2023-05-15 DIAGNOSIS — E119 Type 2 diabetes mellitus without complications: Secondary | ICD-10-CM

## 2023-05-15 MED ORDER — EMPAGLIFLOZIN 25 MG PO TABS
25.0000 mg | ORAL_TABLET | Freq: Every day | ORAL | 3 refills | Status: DC
Start: 2023-05-15 — End: 2023-05-21

## 2023-05-15 NOTE — Progress Notes (Signed)
Subjective:    Patient ID: Dustin Wood, male    DOB: 04/29/1956, 67 y.o.   MRN: [id number]  HPI Patient is today for an echo.  He has not been seen in over a year.  He has a history of coronary artery disease status post two-vessel CABG in 2023.  He is currently on aspirin.  He is on atorvastatin 20 mg a day.  He takes metoprolol and also amlodipine.  Cardiologist recommended amlodipine secondary to vasospasms.  He denies any dizziness or lightheadedness despite his blood pressure being low.  He is also on Jardiance for diabetes.  His last hemoglobin A1c was in May and at that time it was 6.3.  He is due for a urine protein creatinine ratio.  His colonoscopy and prostate cancer screening are up-to-date.  He declines a flu shot.  He declines a COVID shot.  He declines a shingles shot.  He declines Pneumovax 23. 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  Normal RCA and LCx.  Normal EF and EDP. => Due to location and bifurcation, referred for CABG.   SCC (squamous cell carcinoma) 06/30/2016   RIGHT DORSAL HAND CX3 5FU   SCC (squamous cell carcinoma) 06/30/2016   LEFT HAND TX CX3 5FU   Squamous cell carcinoma of skin 12/2015   RIGHT TEMPLE CX3 5FU    Denies surgeries  Allergies  Allergen Reactions   Penicillins Other (See Comments)    Childhood allergy   Rosuvastatin Other (See Comments)    myalgias   Social History   Socioeconomic History   Marital status: Married    Spouse name: Not on file   Number of children: Not on file   Years of education: 16   Highest education level: Bachelor's degree (e.g., BA, AB, BS)  Occupational History   Not on file   Tobacco Use   Smoking status: Never   Smokeless tobacco: Never  Vaping Use   Vaping status: Never Used  Substance and Sexual Activity   Alcohol use: Yes    Comment: rare   Drug use: No   Sexual activity: Not on file  Other Topics Concern   Not on file  Social History Narrative   Not on file   Social Determinants of Health   Financial Resource Strain: Not on file  Food Insecurity: No Food Insecurity (04/14/2022)   Hunger Vital Sign    Worried About Running Out of Food in the Last Year: Never true    Ran Out of Food in the Last Year: Never true  Transportation Needs: No Transportation Needs (04/14/2022)   PRAPARE - Administrator, Civil Service (Medical): No    Lack of Transportation (Non-Medical): No  Physical Activity: Not on file  Stress: Not on file  Social Connections: Not on file  Intimate Partner Violence: Not At Risk (04/14/2022)   Humiliation, Afraid, Rape, and Kick questionnaire    Fear of Current or Ex-Partner: No    Emotionally Abused: No    Physically Abused: No    Sexually Abused: No      Review of Systems  All other systems reviewed and are negative.  Objective:   Physical Exam Vitals reviewed.  Constitutional:      General: He is not in acute distress.    Appearance: He is well-developed. He is not diaphoretic.  HENT:     Head: Normocephalic and atraumatic.  Neck:     Thyroid: No thyromegaly.     Vascular: No JVD.     Trachea: No tracheal deviation.  Cardiovascular:     Rate and Rhythm: Normal rate and regular rhythm.     Heart sounds: Normal heart sounds. No murmur heard.    No friction rub. No gallop.  Pulmonary:     Effort: Pulmonary effort is normal. No respiratory distress.     Breath sounds: Normal breath sounds. No stridor. No wheezing or rales.  Chest:     Chest wall: No tenderness.  Abdominal:     General: Abdomen is flat. Bowel sounds are normal. There is no distension.     Palpations: Abdomen is soft.      Tenderness: There is no abdominal tenderness. There is no guarding or rebound.  Skin:    General: Skin is warm.     Coloration: Skin is not pale.     Findings: No erythema or rash.  Neurological:     Mental Status: He is alert.     Motor: No abnormal muscle tone.     Deep Tendon Reflexes: Reflexes are normal and symmetric.  Psychiatric:        Behavior: Behavior normal.        Thought Content: Thought content normal.        Judgment: Judgment normal.           Assessment & Plan:  Diabetes mellitus type II, non insulin dependent (HCC) - Plan: Protein / Creatinine Ratio, Urine, empagliflozin (JARDIANCE) 25 MG TABS tablet I explained to the patient that I would like to repeat his lab work every 6 months.  His blood pressure today is low but he is asymptomatic.  Will not make any changes in his hypertensive medication now as the patient states that his cardiologist wanted him to stay on amlodipine due to potential vasospasms.  Continue Jardiance as his A1c is under good control however I will check a urine protein creatinine ratio.  Colonoscopy and prostate cancer screening are up-to-date.  Strongly recommended Pneumovax 23, Shingrix, flu shot, and COVID shot.  Patient declined all vaccinations today

## 2023-05-16 LAB — PROTEIN / CREATININE RATIO, URINE
Creatinine, Urine: 121 mg/dL (ref 20–320)
Protein/Creat Ratio: 83 mg/g{creat} (ref 25–148)
Protein/Creatinine Ratio: 0.083 mg/mg{creat} (ref 0.025–0.148)
Total Protein, Urine: 10 mg/dL (ref 5–25)

## 2023-05-18 ENCOUNTER — Encounter: Payer: Self-pay | Admitting: Cardiology

## 2023-05-18 ENCOUNTER — Encounter: Payer: Self-pay | Admitting: Family Medicine

## 2023-05-21 ENCOUNTER — Other Ambulatory Visit: Payer: Self-pay

## 2023-05-21 DIAGNOSIS — E119 Type 2 diabetes mellitus without complications: Secondary | ICD-10-CM

## 2023-05-21 MED ORDER — EMPAGLIFLOZIN 25 MG PO TABS
25.0000 mg | ORAL_TABLET | Freq: Every day | ORAL | 3 refills | Status: DC
Start: 2023-05-21 — End: 2024-05-24

## 2023-05-21 NOTE — Telephone Encounter (Signed)
Lets see how he does with just doing half a tablet of amlodipine.  If the blood pressure still continue to be low then we would simply stop it.  Bryan Lemma, MD

## 2023-05-22 MED ORDER — AMLODIPINE BESYLATE 5 MG PO TABS: 2.5000 mg | ORAL_TABLET | Freq: Every day | ORAL | Status: DC

## 2023-08-14 ENCOUNTER — Encounter: Payer: Self-pay | Admitting: Cardiology

## 2023-08-17 ENCOUNTER — Ambulatory Visit: Payer: 59 | Admitting: Family Medicine

## 2023-08-17 VITALS — BP 122/68 | HR 75 | Temp 99.2°F | Ht 69.0 in | Wt 176.8 lb

## 2023-08-17 DIAGNOSIS — M542 Cervicalgia: Secondary | ICD-10-CM | POA: Diagnosis not present

## 2023-08-17 NOTE — Progress Notes (Signed)
Subjective:    Patient ID: Dustin Wood, male    DOB: 14-Mar-1956, 68 y.o.   MRN: [id number]  Sore Throat   Patient states that he developed pain on the right side of his neck for the last 3 to 4 days.  He states that it would hurt if he would swallow.  It felt more like it was on the surface of his neck however.  He denies any injuries.  There is no lymphadenopathy on the right side of the neck.  There is no palpable mass.  He denies any dysphagia today.  He denies any sore throat.  He denies any fevers or chills.  He denies any acid reflux.  He denies any night sweats or bodyaches. Past Medical History:  Diagnosis Date  . Diabetes mellitus without complication (HCC)   . Hyperlipidemia   . Melanoma (HCC) 04/07/2019   right fore arm MIS TX EXC  . Non-ST elevation myocardial infarction (NSTEMI) (HCC) 04/13/2022  . 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  Normal RCA and LCx.  Normal EF and EDP. => Due to location and bifurcation, referred for CABG.  . SCC (squamous cell carcinoma) 06/30/2016   RIGHT DORSAL HAND CX3 5FU  . SCC (squamous cell carcinoma) 06/30/2016   LEFT HAND TX CX3 5FU  . Squamous cell carcinoma of skin 12/2015   RIGHT TEMPLE CX3 5FU    Denies surgeries  Allergies  Allergen Reactions  . Penicillins Other (See Comments)    Childhood allergy  . Rosuvastatin Other (See Comments)    myalgias   Social History   Socioeconomic History  . Marital status: Married    Spouse name: Not on file  . Number of children: Not on file  . Years of education: 52  . Highest education level: Bachelor's degree (e.g., BA, AB, BS)  Occupational History  . Not on file  Tobacco Use  . Smoking status: Never  . Smokeless tobacco: Never  Vaping Use  . Vaping status: Never Used  Substance and Sexual Activity  . Alcohol use: Yes    Comment: rare  . Drug use: No  . Sexual activity:  Not on file  Other Topics Concern  . Not on file  Social History Narrative  . Not on file   Social Drivers of Health   Financial Resource Strain: Not on file  Food Insecurity: No Food Insecurity (04/14/2022)   Hunger Vital Sign   . Worried About Programme researcher, broadcasting/film/video in the Last Year: Never true   . Ran Out of Food in the Last Year: Never true  Transportation Needs: No Transportation Needs (04/14/2022)   PRAPARE - Transportation   . Lack of Transportation (Medical): No   . Lack of Transportation (Non-Medical): No  Physical Activity: Not on file  Stress: Not on file  Social Connections: Not on file  Intimate Partner Violence: Not At Risk (04/14/2022)   Humiliation, Afraid, Rape, and Kick questionnaire   . Fear of Current or Ex-Partner: No   . Emotionally Abused: No   . Physically Abused: No   . Sexually Abused: No      Review of Systems  All other systems reviewed and are negative.      Objective:   Physical Exam Vitals reviewed.  Constitutional:      General: He is not in acute distress.    Appearance: He is well-developed. He is not diaphoretic.  HENT:     Head: Normocephalic and atraumatic.     Right Ear: Tympanic membrane and ear canal normal. Tympanic membrane is not erythematous.     Left Ear: Tympanic membrane and ear canal normal. Tympanic membrane is not erythematous.     Nose: No congestion or rhinorrhea.     Mouth/Throat:     Mouth: Mucous membranes are moist. No oral lesions.     Pharynx: No oropharyngeal exudate or posterior oropharyngeal erythema.     Tonsils: No tonsillar exudate.  Neck:     Thyroid: No thyromegaly.     Vascular: No JVD.     Trachea: No tracheal deviation.   Cardiovascular:     Rate and Rhythm: Normal rate and regular rhythm.     Heart sounds: Normal heart sounds. No murmur heard.    No friction rub. No gallop.  Pulmonary:     Effort: Pulmonary effort is normal. No respiratory distress.     Breath sounds: Normal breath sounds. No  stridor. No wheezing or rales.  Chest:     Chest wall: No tenderness.  Abdominal:     General: Abdomen is flat.  Musculoskeletal:     Cervical back: Normal range of motion and neck supple.  Lymphadenopathy:     Cervical: No cervical adenopathy.  Skin:    General: Skin is warm.     Coloration: Skin is not pale.     Findings: No erythema or rash.  Neurological:     Mental Status: He is alert.     Motor: No abnormal muscle tone.     Deep Tendon Reflexes: Reflexes are normal and symmetric.  Psychiatric:        Thought Content: Thought content normal.        Judgment: Judgment normal.          Assessment & Plan:  Neck pain Patient's exam today is completely normal.  I do not appreciate any lymphadenopathy or neck mass or swelling in the submandibular or parotid glands.  There is no erythema in the throat.  There is no pain with range of motion.  He was having some mild pain with swallowing but this seems to be getting better.  Therefore I recommended that we simply monitor this.  If the patient develops worsening pain or fever or palpable mass, he will follow-up with me immediately.  However it seems to be a self-limited issue that is gradually improving

## 2023-08-25 DIAGNOSIS — Z951 Presence of aortocoronary bypass graft: Secondary | ICD-10-CM

## 2023-08-25 DIAGNOSIS — E781 Pure hyperglyceridemia: Secondary | ICD-10-CM

## 2023-08-25 DIAGNOSIS — E785 Hyperlipidemia, unspecified: Secondary | ICD-10-CM

## 2023-08-25 DIAGNOSIS — E119 Type 2 diabetes mellitus without complications: Secondary | ICD-10-CM

## 2023-09-07 ENCOUNTER — Other Ambulatory Visit: Payer: Self-pay | Admitting: Cardiology

## 2023-09-10 NOTE — Telephone Encounter (Signed)
LEFT DETAILED MESSAGE REGARDING RECENT MYCHART MESSAGE. INFORMED PT THAT WE DID ORDER FASTING LAB IN RESPONSE TO MYCHART MESSAGE, W/FASTING LAB INSTRUCTIONS.

## 2023-09-16 LAB — COMPREHENSIVE METABOLIC PANEL
ALT: 26 [IU]/L (ref 0–44)
AST: 19 [IU]/L (ref 0–40)
Albumin: 4.3 g/dL (ref 3.9–4.9)
Alkaline Phosphatase: 67 [IU]/L (ref 44–121)
BUN/Creatinine Ratio: 19 (ref 10–24)
BUN: 20 mg/dL (ref 8–27)
Bilirubin Total: 0.4 mg/dL (ref 0.0–1.2)
CO2: 22 mmol/L (ref 20–29)
Calcium: 9.6 mg/dL (ref 8.6–10.2)
Chloride: 106 mmol/L (ref 96–106)
Creatinine, Ser: 1.05 mg/dL (ref 0.76–1.27)
Globulin, Total: 2.6 g/dL (ref 1.5–4.5)
Glucose: 110 mg/dL — ABNORMAL HIGH (ref 70–99)
Potassium: 5.3 mmol/L — ABNORMAL HIGH (ref 3.5–5.2)
Sodium: 142 mmol/L (ref 134–144)
Total Protein: 6.9 g/dL (ref 6.0–8.5)
eGFR: 79 mL/min/{1.73_m2} (ref >59)

## 2023-09-16 LAB — CBC
Hematocrit: 49.7 % (ref 37.5–51.0)
Hemoglobin: 16.2 g/dL (ref 13.0–17.7)
MCH: 27.6 pg (ref 26.6–33.0)
MCHC: 32.6 g/dL (ref 31.5–35.7)
MCV: 85 fL (ref 79–97)
Platelets: 243 10*3/uL (ref 150–450)
RBC: 5.86 x10E6/uL — ABNORMAL HIGH (ref 4.14–5.80)
RDW: 13.1 % (ref 11.6–15.4)
WBC: 7.6 10*3/uL (ref 3.4–10.8)

## 2023-09-16 LAB — LIPID PANEL
Chol/HDL Ratio: 3.6 {ratio} (ref 0.0–5.0)
Cholesterol, Total: 124 mg/dL (ref 100–199)
HDL: 34 mg/dL — ABNORMAL LOW (ref >39)
LDL Chol Calc (NIH): 77 mg/dL (ref 0–99)
Triglycerides: 61 mg/dL (ref 0–149)
VLDL Cholesterol Cal: 13 mg/dL (ref 5–40)

## 2023-09-23 NOTE — Progress Notes (Signed)
 Cardiology Clinic Note   Patient Name: Dustin Wood Date of Encounter: 09/25/2023  Primary Care Provider:  Donita Brooks, MD Primary Cardiologist:  Bryan Lemma, MD  Patient Profile    Dustin Wood 68 year old male presents the clinic today for follow-up evaluation of his coronary artery disease and hypertension.  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  Normal RCA and LCx.  Normal EF and EDP. => Due to location and bifurcation, referred for CABG.   SCC (squamous cell carcinoma) 06/30/2016   RIGHT DORSAL HAND CX3 5FU   SCC (squamous cell carcinoma) 06/30/2016   LEFT HAND TX CX3 5FU   Squamous cell carcinoma of skin 12/2015   RIGHT TEMPLE CX3 5FU   Past Surgical History:  Procedure Laterality Date   CHOLECYSTECTOMY     CORONARY ARTERY BYPASS GRAFT N/A 04/17/2022   Procedure: CORONARY ARTERY BYPASS GRAFTING (CABG) X TWO, USING LEFT INTERNAL MAMMARY ARTERY AND LEFT ARM RADIAL ARTERY;  Surgeon: Corliss Skains, MD;  Location: [facility] OR;  Service: Open Heart Surgery;  Laterality: N/A;   HERNIA REPAIR     LEFT HEART CATH AND CORONARY ANGIOGRAPHY N/A 04/14/2022   Procedure: LEFT HEART CATH AND CORONARY ANGIOGRAPHY;  Surgeon: Runell Gess, MD;  [facility]: [facility];  Service: CV::; 90% thrombotic ostial and proximal segmental LAD involving large D1.  Normal RCA and LCx.  Normal EF and EDP. => Due to location and bifurcation, referred for CABG.   MELANOMA EXCISION     RADIAL ARTERY HARVEST N/A 04/17/2022   Procedure: LEFT RADIAL ARTERY HARVEST;  Surgeon: Corliss Skains, MD;  Location: [facility] OR;  Service: Open  Heart Surgery;  Laterality: N/A;   TEE WITHOUT CARDIOVERSION N/A 04/17/2022   Procedure: TRANSESOPHAGEAL ECHOCARDIOGRAM (TEE);  Surgeon: Corliss Skains, MD;  Location: [facility] OR;  Service: Open Heart Surgery;  Laterality: N/A;   TRANSTHORACIC ECHOCARDIOGRAM  04/14/2022   (Non-STEMI) EF 50 to 55% (low normal).  Mild HK basal to mid inferior and inferolateral wall.  Concordant septal motion.  GR 1 DD.  Normal LVEDP.  Normal RV size and function.  Normal RV P.  Normal MV and AoV.    Allergies  Allergies  Allergen Reactions   Penicillins Other (See Comments)    Childhood allergy   Rosuvastatin Other (See Comments)    myalgias    History of Present Illness    Dustin Wood has a PMH of coronary artery disease status post CABG x 2, NSTEMI, hyperlipidemia, and myalgia due to statin.  His PMH also includes type 2 diabetes.  He had NSTEMI 04/13/2022.  He underwent cardiac catheterization 04/14/2022 which showed LAD-D1 bifurcation lesion he was referred for CABG.  On 04/09/2022 he had LIMA-LAD, left radial-D1.  He was seen in follow-up by Dr. Herbie Baltimore 2/24.  During that time he was doing well.  He had been working out at the gym 3 to 4 days/week.  He was doing the treadmill and the stairstepper as well as the NuStep.  He was also doing some light weight type activities.  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  He did note occasional cramping if he was overdoing his activity.  He denied recurrent cardiac symptoms.  His blood pressure was borderline low.  He denied dizziness.  His A1c was down to 6.5.  He had lost around 5 pounds.  He did not tolerate rosuvastatin.  He had been placed on nexlizet and was referred to the pharmacy lipid clinic.  He was prescribed atorvastatin 20 mg with plans to slowly uptitrate and start Nexlizet.  His LDL was noted to be 153.  It was recommended that his atorvastatin be uptitrated to 40 mg daily.  He was seen in follow-up by Dr. Herbie Baltimore 03/27/2023.  During that time he  reported that he was doing well.  He had gotten out of the habit of exercising at the gym.  He had been taking care of his wife after surgery.  He was starting to get back to the gym and noted he was a little bit deconditioned.  He did note some exertional dyspnea.  He denied chest pain or pressure.  He denied lower extremity claudication.  He denied syncope and near syncope.  His repeat lab work 7/24 showed an LDL of 70.  He presents to the clinic today for follow-up evaluation and states he has been doing well.  His wife is back on her feet.  He continues to take care of his 7 horses.  He now has his 21-year-old grandson who is helping him out.  We reviewed his open heart surgery and recent lab work.  He expressed understanding.  I will continue his current medication regimen, have him maintain his physical activity, continue heart healthy low-sodium diet and plan follow-up in 12 months.  Today he denies chest pain, shortness of breath, lower extremity edema, fatigue, palpitations, melena, hematuria, hemoptysis, diaphoresis, weakness, presyncope, syncope, orthopnea, and PND.    Home Medications    Prior to Admission medications   Medication Sig Start Date End Date Taking? Authorizing Provider  Accu-Chek Softclix Lancets lancets Use as instructed 06/11/22   Donita Brooks, MD  amLODipine (NORVASC) 5 MG tablet Take 0.5 tablets (2.5 mg total) by mouth daily. 05/22/23   Marykay Lex, MD  ascorbic acid (VITAMIN C) 500 MG tablet Take 500 mg by mouth daily.    [provider]  aspirin EC 81 MG tablet Take 1 tablet (81 mg total) by mouth daily. Swallow whole. 04/22/22   Gold, Deniece Portela E, PA-C  atorvastatin (LIPITOR) 20 MG tablet TAKE 1 TABLET BY MOUTH EVERY DAY 09/07/23   Marykay Lex, MD  blood glucose meter kit and supplies KIT Dispense based on patient and insurance preference. Use up to four times daily as directed. 04/22/22   Donita Brooks, MD  Cholecalciferol (VITAMIN D3) 50 MCG  (2000 UT) TABS Take 2,000 Units by mouth in the morning.    [provider]  clopidogrel (PLAVIX) 75 MG tablet TAKE 1 TABLET BY MOUTH EVERY DAY 03/17/23   Marykay Lex, MD  empagliflozin (JARDIANCE) 25 MG TABS tablet Take 1 tablet (25 mg total) by mouth daily before breakfast. 05/21/23   Donita Brooks, MD  fluorouracil (EFUDEX) 5 % cream APPLY TO AFFECTED AREA(S) AT BEDTIME AS DIRECTED Patient taking differently: Apply 1 Application topically daily as needed (for rash). 03/08/21   Donita Brooks, MD  glucose blood test strip Use to check blood sugars up to 4 times per day. 07/03/22   Donita Brooks, MD  metoprolol tartrate (LOPRESSOR) 25 MG tablet  TAKE 0.5 TABLETS BY MOUTH 2 TIMES DAILY. 03/17/23   Marykay Lex, MD  Multiple Vitamin (MULITIVITAMIN WITH MINERALS) TABS Take 1 tablet by mouth daily.    [provider]  Saw Palmetto, Serenoa repens, 540 MG CAPS Take 540 mg by mouth in the morning.    [provider]  Turmeric (QC TUMERIC COMPLEX) 500 MG CAPS Take 1 capsule by mouth in the morning.    [provider]  VASCEPA 1 g capsule TAKE 2 CAPSULES BY MOUTH 2 TIMES DAILY. 03/16/23   Marykay Lex, MD  zinc gluconate 50 MG tablet Take 50 mg by mouth daily.    [provider]    Family History    History reviewed. No pertinent family history. has no family status information on file.   Social History    Social History   Socioeconomic History   Marital status: Married    Spouse name: Not on file   Number of children: Not on file   Years of education: 16   Highest education level: Bachelor's degree (e.g., BA, AB, BS)  Occupational History   Not on file  Tobacco Use   Smoking status: Never   Smokeless tobacco: Never  Vaping Use   Vaping status: Never Used  Substance and Sexual Activity   Alcohol use: Yes    Comment: rare   Drug use: No   Sexual activity: Not on file  Other Topics Concern   Not on file  Social History  Narrative   Not on file   Social Drivers of Health   Financial Resource Strain: Not on file  Food Insecurity: No Food Insecurity (04/14/2022)   Hunger Vital Sign    Worried About Running Out of Food in the Last Year: Never true    Ran Out of Food in the Last Year: Never true  Transportation Needs: No Transportation Needs (04/14/2022)   PRAPARE - Administrator, Civil Service (Medical): No    Lack of Transportation (Non-Medical): No  Physical Activity: Not on file  Stress: Not on file  Social Connections: Not on file  Intimate Partner Violence: Not At Risk (04/14/2022)   Humiliation, Afraid, Rape, and Kick questionnaire    Fear of Current or Ex-Partner: No    Emotionally Abused: No    Physically Abused: No    Sexually Abused: No     Review of Systems    General:  No chills, fever, night sweats or weight changes.  Cardiovascular:  No chest pain, dyspnea on exertion, edema, orthopnea, palpitations, paroxysmal nocturnal dyspnea. Dermatological: No rash, lesions/masses Respiratory: No cough, dyspnea Urologic: No hematuria, dysuria Abdominal:   No nausea, vomiting, diarrhea, bright red blood per rectum, melena, or hematemesis Neurologic:  No visual changes, wkns, changes in mental status. All other systems reviewed and are otherwise negative except as noted above.  Physical Exam    VS:  BP 102/64 (BP Location: Left Arm, Patient Position: Sitting, Cuff Size: Large)   Pulse 75   Ht 5\' 9"  (1.753 m)   Wt 179 lb 9.6 oz (81.5 kg)   SpO2 95%   BMI 26.52 kg/m  , BMI Body mass index is 26.52 kg/m. GEN: Well nourished, well developed, in no acute distress. HEENT: normal. Neck: Supple, no JVD, carotid bruits, or masses. Cardiac: RRR, no murmurs, rubs, or gallops. No clubbing, cyanosis, edema.  Radials/DP/PT 2+ and equal bilaterally.  Respiratory:  Respirations regular and unlabored, clear to auscultation bilaterally. GI: Soft, nontender, nondistended, BS +  x 4. MS: no  deformity or atrophy. Skin: warm and dry, no rash. Neuro:  Strength and sensation are intact. Psych: Normal affect.  Accessory Clinical Findings    Recent Labs: 09/15/2023: ALT 26; BUN 20; Creatinine, Ser 1.05; Hemoglobin 16.2; Platelets 243; Potassium 5.3; Sodium 142   Recent Lipid Panel    Component Value Date/Time   CHOL 124 09/15/2023 0805   TRIG 61 09/15/2023 0805   HDL 34 (L) 09/15/2023 0805   CHOLHDL 3.6 09/15/2023 0805   CHOLHDL 3.0 01/28/2023 0843   VLDL 70 (H) 04/14/2022 0220   LDLCALC 77 09/15/2023 0805   LDLCALC 67 01/28/2023 0843         ECG personally reviewed by me today-none today.    Echocardiogram 04/14/2022   IMPRESSIONS     1. Left ventricular ejection fraction, by estimation, is 50 to 55%. The  left ventricle has low normal function. The left ventricle demonstrates  regional wall motion abnormalities (see scoring diagram/findings for  description). Left ventricular diastolic   parameters are consistent with Grade I diastolic dysfunction (impaired  relaxation). There is incoordinate septal motion. There is mild  hypokinesis of the left ventricular, basal-mid inferior wall and  inferolateral wall.   2. Right ventricular systolic function is low normal. The right  ventricular size is normal.   3. The mitral valve is grossly normal. Trivial mitral valve  regurgitation.   4. The aortic valve is tricuspid. Aortic valve regurgitation is not  visualized. Aortic valve sclerosis/calcification is present, without any  evidence of aortic stenosis.   Comparison(s): No prior Echocardiogram.   FINDINGS   Left Ventricle: Left ventricular ejection fraction, by estimation, is 50  to 55%. The left ventricle has low normal function. The left ventricle  demonstrates regional wall motion abnormalities. Mild hypokinesis of the  left ventricular, basal-mid inferior   wall and inferolateral wall. The left ventricular internal cavity size  was normal in size. There  is no left ventricular hypertrophy. Incoordinate  septal motion. Left ventricular diastolic parameters are consistent with  Grade I diastolic dysfunction  (impaired relaxation). Normal left ventricular filling pressure.   Right Ventricle: The right ventricular size is normal. No increase in  right ventricular wall thickness. Right ventricular systolic function is  low normal.   Left Atrium: Left atrial size was normal in size.   Right Atrium: Right atrial size was normal in size.   Pericardium: There is no evidence of pericardial effusion.   Mitral Valve: The mitral valve is grossly normal. Trivial mitral valve  regurgitation.   Tricuspid Valve: The tricuspid valve is grossly normal. Tricuspid valve  regurgitation is trivial.   Aortic Valve: The aortic valve is tricuspid. Aortic valve regurgitation is  not visualized. Aortic valve sclerosis/calcification is present, without  any evidence of aortic stenosis.   Pulmonic Valve: The pulmonic valve was grossly normal. Pulmonic valve  regurgitation is trivial.   Aorta: The aortic root and ascending aorta are structurally normal, with  no evidence of dilitation.   Venous: The inferior vena cava was not well visualized.   IAS/Shunts: No atrial level shunt detected by color flow Doppler.       Assessment & Plan   1.  Coronary artery disease-denies anginal type symptoms.  He is status post CABG x 2.  Details above. Continue atorvastatin, aspirin, Vascepa, metoprolol, amlodipine  Hyperlipidemia- 09/15/2023: Cholesterol, Total 124; HDL 34; LDL Chol Calc (NIH) 77; Triglycerides 61 High-fiber diet Continue aspirin, atorvastatin, Vascepa Increase physical activity as tolerated  Type 2 diabetes-A1c 6.3 on 01/28/23 Continue Jardiance Follows with PCP  Disposition: Follow-up with Dr. Herbie Baltimore or me in 12 months.   Thomasene Ripple. Wendee Hata NP-C     09/25/2023, 4:16 PM [facility] [address] Office  [phone] Fax [phone]    I spent 14 minutes examining this patient, reviewing medications, and using patient centered shared decision making involving their cardiac care.   I spent  20 minutes reviewing past medical history,  medications, and prior cardiac tests.

## 2023-09-25 ENCOUNTER — Ambulatory Visit: Payer: 59 | Attending: General Practice | Admitting: General Practice

## 2023-09-25 ENCOUNTER — Encounter: Payer: Self-pay | Admitting: General Practice

## 2023-09-25 VITALS — BP 102/64 | HR 75 | Ht 69.0 in | Wt 179.6 lb

## 2023-09-25 DIAGNOSIS — E785 Hyperlipidemia, unspecified: Secondary | ICD-10-CM | POA: Diagnosis not present

## 2023-09-25 DIAGNOSIS — I5041 Acute combined systolic (congestive) and diastolic (congestive) heart failure: Secondary | ICD-10-CM

## 2023-09-25 DIAGNOSIS — I2510 Atherosclerotic heart disease of native coronary artery without angina pectoris: Secondary | ICD-10-CM | POA: Diagnosis not present

## 2023-09-25 DIAGNOSIS — N183 Chronic kidney disease, stage 3 unspecified: Secondary | ICD-10-CM

## 2023-09-25 DIAGNOSIS — E1122 Type 2 diabetes mellitus with diabetic chronic kidney disease: Secondary | ICD-10-CM | POA: Diagnosis not present

## 2023-09-25 NOTE — Patient Instructions (Signed)
 Medication Instructions:  No changes *If you need a refill on your cardiac medications before your next appointment, please call your pharmacy*  Lab Work: No labs  Testing/Procedures: No testing  Follow-Up: At [facility], you and your health needs are our priority.  As part of our continuing mission to provide you with exceptional heart care, we have created designated Provider Care Teams.  These Care Teams include your primary Cardiologist (physician) and Advanced Practice Providers (APPs -  Physician Assistants and Nurse Practitioners) who all work together to provide you with the care you need, when you need it.  We recommend signing up for the patient portal called "MyChart".  Sign up information is provided on this After Visit Summary.  MyChart is used to connect with patients for Virtual Visits (Telemedicine).  Patients are able to view lab/test results, encounter notes, upcoming appointments, etc.  Non-urgent messages can be sent to your provider as well.   To learn more about what you can do with MyChart, go to [email or web address].    Your next appointment:   1 year(s)  Provider:   Bryan Lemma, MD   Other Instructions Maintain physical activity

## 2023-10-14 ENCOUNTER — Encounter: Payer: Self-pay | Admitting: Cardiology

## 2023-10-17 NOTE — Telephone Encounter (Signed)
 No restriction => but if the dentist is planning on doing deep cleaning, may withhold Plavix 5 days preprocedure.  Bryan Lemma, MD

## 2023-10-22 ENCOUNTER — Encounter: Payer: Self-pay | Admitting: Family Medicine

## 2023-10-22 ENCOUNTER — Ambulatory Visit: Admitting: Family Medicine

## 2023-10-22 VITALS — BP 112/62 | HR 85 | Temp 99.9°F | Ht 69.0 in | Wt 176.1 lb

## 2023-10-22 DIAGNOSIS — I2510 Atherosclerotic heart disease of native coronary artery without angina pectoris: Secondary | ICD-10-CM | POA: Diagnosis not present

## 2023-10-22 DIAGNOSIS — R6889 Other general symptoms and signs: Secondary | ICD-10-CM | POA: Insufficient documentation

## 2023-10-22 LAB — INFLUENZA A AND B AG, IMMUNOASSAY
INFLUENZA A ANTIGEN: NOT DETECTED
INFLUENZA B ANTIGEN: NOT DETECTED

## 2023-10-22 LAB — CBC
HCT: 48.3 % (ref 38.5–50.0)
Hemoglobin: 16 g/dL (ref 13.2–17.1)
MCH: 28.3 pg (ref 27.0–33.0)
MCHC: 33.1 g/dL (ref 32.0–36.0)
MCV: 85.3 fL (ref 80.0–100.0)
MPV: 9.6 fL (ref 7.5–12.5)
Platelets: 264 10*3/uL (ref 140–400)
RBC: 5.66 10*6/uL (ref 4.20–5.80)
RDW: 13.1 % (ref 11.0–15.0)
WBC: 20.5 10*3/uL — ABNORMAL HIGH (ref 3.8–10.8)

## 2023-10-22 NOTE — Progress Notes (Signed)
 Patient Office Visit  Assessment & Plan:  Flu-like symptoms -     Influenza A and B Ag, Immunoassay -     CBC  Coronary artery disease involving native coronary artery of native heart without angina pectoris   Flu test is negative-patient aware.  Discussed testing for COVID and RSV but patient declined.  No improvement or worsening symptoms he is to notify us.  Stay well-hydrated.  Patient is aware that he can try over-the-counter allergy medications i.e. Claritin and/or Flonase.  Follow-up on CBC and notify patient.  Patient declined cough medication at this time. Encouraged him to Improve hydration.  No follow-ups on file.   Subjective:    Patient ID: Dustin Wood, male    DOB: [birth year]  Age: 68 y.o. MRN: [id number]  Chief Complaint  Patient presents with   Cough    X 5 days. Pt states he was mowing the yard and has been sick ever since.     Cough   Flu Like symptoms/cough-patient was mowing the yard about 5 days ago and experienced some congestion and cough.  Patient does have allergies but does not take allergy medications during this season.  Pt had a fever this AM, myalgias, fatigue and decreased appetite. Feeling extremely fatigued, no SOB or wheezing. No smoking. Patient's wife concerned that his symptoms were reminiscent of what he had prior to his heart attack last year.  Patient had CABG x 2 last year. Pt not having chest pain shortness of breath or wheezing.  Patient did not receive flu shot or pneumonia vaccines. Pt has not taken anything OTC meds for this. Never smoked. Pt not feeling dizzy or having presyncope/syncope but feels very fatigued today. Pt has not been drinking enough fluids.   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  Normal RCA and LCx.  Normal EF and EDP. => Due to location and bifurcation, referred for CABG.   SCC (squamous cell carcinoma) 06/30/2016   RIGHT DORSAL HAND CX3 5FU   SCC (squamous cell carcinoma) 06/30/2016   LEFT HAND TX CX3 5FU   Squamous cell carcinoma of skin 12/2015   RIGHT TEMPLE CX3 5FU   Past Surgical History:  Procedure Laterality Date   CHOLECYSTECTOMY     CORONARY ARTERY BYPASS GRAFT N/A 04/17/2022   Procedure: CORONARY ARTERY BYPASS GRAFTING (CABG) X TWO, USING LEFT INTERNAL MAMMARY ARTERY AND LEFT ARM RADIAL ARTERY;  Surgeon: Corliss Skains, MD;  Location: [facility] OR;  Service: Open Heart Surgery;  Laterality: N/A;   HERNIA REPAIR     LEFT HEART CATH AND CORONARY ANGIOGRAPHY N/A 04/14/2022   Procedure: LEFT HEART CATH AND CORONARY ANGIOGRAPHY;  Surgeon: Runell Gess, MD;  Location: [facility] INVAS[facility] LAB;  Service: CV::; 90% thrombotic ostial and proximal segmental LAD involving large D1.  Normal RCA and LCx.  Normal EF and EDP. => Due to location and bifurcation, referred for CABG.   MELANOMA EXCISION     RADIAL ARTERY  HARVEST N/A 04/17/2022   Procedure: LEFT RADIAL ARTERY HARVEST;  Surgeon: Corliss Skains, MD;  Location: [facility] OR;  Service: Open Heart Surgery;  Laterality: N/A;   TEE WITHOUT CARDIOVERSION N/A 04/17/2022   Procedure: TRANSESOPHAGEAL ECHOCARDIOGRAM (TEE);  Surgeon: Corliss Skains, MD;  Location: [facility] OR;  Service: Open Heart Surgery;  Laterality: N/A;   TRANSTHORACIC ECHOCARDIOGRAM  04/14/2022   (Non-STEMI) EF 50 to 55% (low normal).  Mild HK basal to mid inferior and inferolateral wall.  Concordant septal motion.  GR 1 DD.  Normal LVEDP.  Normal RV size and  function.  Normal RV P.  Normal MV and AoV.   Social History   Tobacco Use   Smoking status: Never   Smokeless tobacco: Never  Vaping Use   Vaping status: Never Used  Substance Use Topics   Alcohol use: Yes    Comment: rare   Drug use: No   History reviewed. No pertinent family history. Allergies  Allergen Reactions   Penicillins Other (See Comments)    Childhood allergy   Rosuvastatin Other (See Comments)    myalgias    Review of Systems  Respiratory:  Positive for cough.       Objective:    BP 112/62   Pulse 85   Temp 99.9 F (37.7 C)   Ht 5\' 9"  (1.753 m)   Wt 176 lb 2 oz (79.9 kg)   SpO2 99%   BMI 26.01 kg/m  BP Readings from Last 3 Encounters:  10/22/23 112/62  09/25/23 102/64  08/17/23 122/68   Wt Readings from Last 3 Encounters:  10/22/23 176 lb 2 oz (79.9 kg)  09/25/23 179 lb 9.6 oz (81.5 kg)  08/17/23 176 lb 12.8 oz (80.2 kg)    Physical Exam Vitals and nursing note reviewed.  Constitutional:      General: He is not in acute distress.    Appearance: Normal appearance.     Comments: Patient is feeling very fatigued and wants to lie down.  HENT:     Head: Normocephalic.     Right Ear: Tympanic membrane, ear canal and external ear normal.     Left Ear: Tympanic membrane, ear canal and external ear normal.     Nose: Congestion present.     Mouth/Throat:     Pharynx: No oropharyngeal exudate or posterior oropharyngeal erythema.  Eyes:     Extraocular Movements: Extraocular movements intact.     Pupils: Pupils are equal, round, and reactive to light.  Cardiovascular:     Rate and Rhythm: Normal rate and regular rhythm.     Heart sounds: Normal heart sounds.  Pulmonary:     Effort: Pulmonary effort is normal.     Breath sounds: Normal breath sounds. No wheezing or rhonchi.  Musculoskeletal:     Right lower leg: No edema.     Left lower leg: No edema.  Neurological:     General: No focal deficit present.     Mental Status: He is alert and  oriented to person, place, and time.  Psychiatric:        Mood and Affect: Mood normal.        Behavior: Behavior normal.      Results for orders placed or performed in visit on 10/22/23  Influenza A and B Ag, Immunoassay  Result Value Ref Range   Source: NASAL    INFLUENZA A ANTIGEN NOT DETECTED NOT DETECTED   INFLUENZA B ANTIGEN NOT DETECTED NOT DETECTED

## 2023-10-23 ENCOUNTER — Ambulatory Visit: Payer: Self-pay | Admitting: *Deleted

## 2023-10-23 ENCOUNTER — Encounter: Payer: Self-pay | Admitting: Family Medicine

## 2023-10-23 NOTE — Telephone Encounter (Signed)
  Chief Complaint: Pt returned call from [facility]   I read him the message from Dr. Riley Nearing dated 10/23/2023 at 7:37 AM.     Symptoms: Pt stated he was feeling better today.   Has not taken steroids or prednisone within the last 2 weeks. Frequency: N/A Pertinent Negatives: Patient denies N/A Disposition: [] ED /[] Urgent Care (no appt availability in office) / [] Appointment(In office/virtual)/ []  [facility]/ [] Home Care/ [] Refused Recommended Disposition /[] [facility]/ [x]  Follow-up with PCP Additional Notes: No questions from pt.    Message sent to [facility] that he returned the call.

## 2023-10-23 NOTE — Telephone Encounter (Signed)
 Reason for Disposition  [1] Follow-up call to recent contact AND [2] information only call, no triage required  Answer Assessment - Initial Assessment Questions 1. REASON FOR CALL or QUESTION: "What is your reason for calling today?" or "How can I best help you?" or "What question do you have that I can help answer?"     Pt is returning a call from [facility].  It's regarding a message from Dr. Riley Nearing dated 10/23/2023 at 7:37 AM.   I read him the message.  He is feeling better today so not going to the ED.   He has not been on any steroids or prednisone.   No questions from pt.  Protocols used: Information Only Call - No Triage-A-AH

## 2023-10-28 ENCOUNTER — Encounter: Payer: Self-pay | Admitting: Family Medicine

## 2023-10-28 ENCOUNTER — Emergency Department (HOSPITAL_BASED_OUTPATIENT_CLINIC_OR_DEPARTMENT_OTHER)
Admission: EM | Admit: 2023-10-28 | Discharge: 2023-10-28 | Disposition: A | Discharge status: Home / Self Care | Attending: Emergency Medicine | Admitting: Emergency Medicine

## 2023-10-28 ENCOUNTER — Other Ambulatory Visit: Payer: Self-pay

## 2023-10-28 ENCOUNTER — Encounter (HOSPITAL_BASED_OUTPATIENT_CLINIC_OR_DEPARTMENT_OTHER): Payer: Self-pay | Admitting: Emergency Medicine

## 2023-10-28 ENCOUNTER — Emergency Department (HOSPITAL_BASED_OUTPATIENT_CLINIC_OR_DEPARTMENT_OTHER)

## 2023-10-28 ENCOUNTER — Emergency Department (HOSPITAL_BASED_OUTPATIENT_CLINIC_OR_DEPARTMENT_OTHER): Admitting: Radiology

## 2023-10-28 DIAGNOSIS — R0781 Pleurodynia: Secondary | ICD-10-CM | POA: Diagnosis present

## 2023-10-28 DIAGNOSIS — I2510 Atherosclerotic heart disease of native coronary artery without angina pectoris: Secondary | ICD-10-CM | POA: Insufficient documentation

## 2023-10-28 DIAGNOSIS — R791 Abnormal coagulation profile: Secondary | ICD-10-CM | POA: Insufficient documentation

## 2023-10-28 DIAGNOSIS — Z79899 Other long term (current) drug therapy: Secondary | ICD-10-CM | POA: Insufficient documentation

## 2023-10-28 DIAGNOSIS — J189 Pneumonia, unspecified organism: Secondary | ICD-10-CM | POA: Diagnosis not present

## 2023-10-28 DIAGNOSIS — Z85828 Personal history of other malignant neoplasm of skin: Secondary | ICD-10-CM | POA: Diagnosis not present

## 2023-10-28 DIAGNOSIS — R0602 Shortness of breath: Secondary | ICD-10-CM

## 2023-10-28 DIAGNOSIS — E119 Type 2 diabetes mellitus without complications: Secondary | ICD-10-CM | POA: Diagnosis not present

## 2023-10-28 DIAGNOSIS — D72829 Elevated white blood cell count, unspecified: Secondary | ICD-10-CM | POA: Diagnosis not present

## 2023-10-28 LAB — BASIC METABOLIC PANEL WITH GFR
Anion gap: 8 (ref 5–15)
BUN: 18 mg/dL (ref 8–23)
CO2: 24 mmol/L (ref 22–32)
Calcium: 8.6 mg/dL — ABNORMAL LOW (ref 8.9–10.3)
Chloride: 105 mmol/L (ref 98–111)
Creatinine, Ser: 0.9 mg/dL (ref 0.61–1.24)
GFR, Estimated: >60 mL/min (ref >60)
Glucose, Bld: 111 mg/dL — ABNORMAL HIGH (ref 70–99)
Potassium: 4 mmol/L (ref 3.5–5.1)
Sodium: 137 mmol/L (ref 135–145)

## 2023-10-28 LAB — CBC
HCT: 41.5 % (ref 39.0–52.0)
Hemoglobin: 14 g/dL (ref 13.0–17.0)
MCH: 28.2 pg (ref 26.0–34.0)
MCHC: 33.7 g/dL (ref 30.0–36.0)
MCV: 83.5 fL (ref 80.0–100.0)
Platelets: 300 10*3/uL (ref 150–400)
RBC: 4.97 MIL/uL (ref 4.22–5.81)
RDW: 13.4 % (ref 11.5–15.5)
WBC: 11.9 10*3/uL — ABNORMAL HIGH (ref 4.0–10.5)
nRBC: 0 % (ref 0.0–0.2)

## 2023-10-28 LAB — TROPONIN I (HIGH SENSITIVITY): Troponin I (High Sensitivity): 3 ng/L (ref <18)

## 2023-10-28 LAB — D-DIMER, QUANTITATIVE: D-Dimer, Quant: 0.97 ug{FEU}/mL — ABNORMAL HIGH (ref 0.00–0.50)

## 2023-10-28 LAB — PROTIME-INR
INR: 1 (ref 0.8–1.2)
Prothrombin Time: 12.9 s (ref 11.4–15.2)

## 2023-10-28 MED ORDER — IOHEXOL 350 MG/ML SOLN
100.0000 mL | Freq: Once | INTRAVENOUS | Status: AC | PRN
  Administered 2023-10-28: 80 mL via INTRAVENOUS

## 2023-10-28 MED ORDER — LEVOFLOXACIN 250 MG PO TABS: 750.0000 mg | ORAL_TABLET | Freq: Every day | ORAL | 0 refills | Status: DC

## 2023-10-28 MED ORDER — LEVOFLOXACIN 750 MG PO TABS
750.0000 mg | ORAL_TABLET | Freq: Once | ORAL | Status: AC
  Administered 2023-10-28: 750 mg via ORAL
  Filled 2023-10-28: qty 1

## 2023-10-28 NOTE — Discharge Instructions (Addendum)
 You were seen in the emergency department for ongoing coughing and left-sided pain The chest x-ray showed a left-sided pneumonia The CAT scan did not show any blood clot in your lungs (pulmonary emboli) but did show pneumonia For this reason we gave your first dose of antibiotics here in the emergency department and called the rest into your pharmacy for you to pick up begin taking as directed Please pick up the prescribed levofloxacin (Levaquin) from your pharmacy and take daily once a day for the next 7 days As discussed take with food and add a probiotic or Greek yogurt to prevent GI upset Follow-up with your primary care doctor early next week to make sure you are feeling better Return to the emergency department for severe pain, trouble breathing or any other concerns

## 2023-10-28 NOTE — ED Provider Notes (Signed)
  Physical Exam  BP 122/72   Pulse (!) 58   Temp 98.7 F (37.1 C) (Oral)   Resp (!) 22   Ht 5\' 9"  (1.753 m)   Wt 81.6 kg   SpO2 97%   BMI 26.58 kg/m   Physical Exam Vitals and nursing note reviewed.  HENT:     Head: Normocephalic and atraumatic.  Eyes:     Pupils: Pupils are equal, round, and reactive to light.  Cardiovascular:     Rate and Rhythm: Normal rate and regular rhythm.  Pulmonary:     Effort: Pulmonary effort is normal.     Breath sounds: Normal breath sounds.  Abdominal:     Palpations: Abdomen is soft.     Tenderness: There is no abdominal tenderness.  Skin:    General: Skin is warm and dry.  Neurological:     Mental Status: He is alert.  Psychiatric:        Mood and Affect: Mood normal.     Procedures  Procedures  ED Course / MDM   Clinical Course as of 10/28/23 1700  Wed Oct 28, 2023  1656 CTA chest shows no evidence of PE.  Consistent with left lower lobe pneumonia.  Informed patient of these findings.  He has remained stable on room air feels well enough to go home.  Will give him the first dose of antibiotics for community-acquired pneumonia here and call the rest into his pharmacy.  He will follow-up with his PCP early next week knows of to come back for should his breathing worsen [MP]    Clinical Course User Index [MP] Royanne Foots, DO   Medical Decision Making I, Estelle June DO, have assumed care of this patient from the previous provider pending CTA chest, reevaluation and disposition  Amount and/or Complexity of Data Reviewed Labs: ordered. Radiology: ordered.  Risk Prescription drug management.          Royanne Foots, DO 10/28/23 1700

## 2023-10-28 NOTE — ED Notes (Signed)
 Transport to CT

## 2023-10-28 NOTE — ED Provider Notes (Signed)
 [place] EMERGENCY DEPARTMENT AT [facility] Provider Note   CSN: [id number] Arrival date & time: 10/28/23  1142     History  Chief Complaint  Patient presents with   Chest Pain    Dustin Wood is a 68 y.o. male.  Is here with cough congestion left-sided rib cage pain for the last week.  Viral symptoms here recently.  Had fever now resolved.  Tested negative for flu and primary care doctor's office.  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  He has a history of CAD diabetes squamous carcinoma.  He is not really endorsing any specific chest pain or exertional symptoms.  Denies any leg swelling blood clot history.  No recent surgery or travel otherwise.  Is not on any blood thinners.  The history is provided by the patient.       Home Medications Prior to Admission medications   Medication Sig Start Date End Date Taking? Authorizing Provider  Accu-Chek Softclix Lancets lancets Use as instructed 06/11/22   Donita Brooks, MD  amLODipine (NORVASC) 5 MG tablet Take 0.5 tablets (2.5 mg total) by mouth daily. 05/22/23   Marykay Lex, MD  ascorbic acid (VITAMIN C) 500 MG tablet Take 500 mg by mouth daily.    [provider]  aspirin EC 81 MG tablet Take 1 tablet (81 mg total) by mouth daily. Swallow whole. 04/22/22   Gold, Deniece Portela E, PA-C  atorvastatin (LIPITOR) 20 MG tablet TAKE 1 TABLET BY MOUTH EVERY DAY 09/07/23   Marykay Lex, MD  blood glucose meter kit and supplies KIT Dispense based on patient and insurance preference. Use up to four times daily as directed. 04/22/22   Donita Brooks, MD  Cholecalciferol (VITAMIN D3) 50 MCG (2000 UT) TABS Take 2,000 Units by mouth in the morning.    [provider]  clopidogrel (PLAVIX) 75 MG tablet TAKE 1 TABLET BY MOUTH EVERY DAY 03/17/23   Marykay Lex, MD  empagliflozin (JARDIANCE) 25 MG TABS tablet Take 1 tablet (25 mg total) by mouth daily before breakfast. 05/21/23   Donita Brooks, MD  fluorouracil (EFUDEX) 5 % cream APPLY TO AFFECTED AREA(S) AT BEDTIME AS DIRECTED Patient taking differently: Apply 1 Application topically daily as needed (for rash). 03/08/21   Donita Brooks, MD  glucose blood test strip Use to check blood sugars up to 4 times per day. 07/03/22   Donita Brooks, MD  metoprolol tartrate (LOPRESSOR) 25 MG tablet TAKE 0.5 TABLETS BY MOUTH 2 TIMES DAILY. 03/17/23   Marykay Lex, MD  Multiple Vitamin (MULITIVITAMIN WITH MINERALS) TABS Take 1 tablet by mouth daily.    [provider]  Saw Palmetto, Serenoa repens, 540 MG CAPS Take 540 mg by mouth in the morning.    [provider]  Turmeric (QC TUMERIC COMPLEX) 500 MG CAPS Take 1 capsule by mouth in the morning.    [provider]  VASCEPA 1 g capsule TAKE 2 CAPSULES BY MOUTH 2 TIMES DAILY. 03/16/23   Marykay Lex, MD  zinc gluconate 50 MG tablet Take 50 mg by mouth daily.    [provider]      Allergies    Penicillins and Rosuvastatin    Review of Systems   Review of Systems  Physical Exam Updated Vital Signs BP 129/74   Pulse 64   Temp 97.7 F (36.5 C)   Resp 20   Ht 5\' 9"  (  1.753 m)   Wt 81.6 kg   SpO2 96%   BMI 26.58 kg/m  Physical Exam Vitals and nursing note reviewed.  Constitutional:      General: He is not in acute distress.    Appearance: He is well-developed. He is not ill-appearing.  HENT:     Head: Normocephalic and atraumatic.  Eyes:     Extraocular Movements: Extraocular movements intact.     Conjunctiva/sclera: Conjunctivae normal.     Pupils: Pupils are equal, round, and reactive to light.  Cardiovascular:     Rate and Rhythm: Normal rate and regular rhythm.     Pulses:          Radial pulses are 2+ on the right side and 2+ on the left side.     Heart sounds: Normal heart sounds. No murmur heard. Pulmonary:     Effort: Pulmonary effort is normal. No respiratory distress.     Breath sounds: Normal breath sounds.   Abdominal:     Palpations: Abdomen is soft.     Tenderness: There is no abdominal tenderness.  Musculoskeletal:        General: No swelling. Normal range of motion.     Cervical back: Normal range of motion and neck supple.     Right lower leg: No edema.     Left lower leg: No edema.  Skin:    General: Skin is warm and dry.     Capillary Refill: Capillary refill takes less than 2 seconds.  Neurological:     Mental Status: He is alert.  Psychiatric:        Mood and Affect: Mood normal.     ED Results / Procedures / Treatments   Labs (all labs ordered are listed, but only abnormal results are displayed) Labs Reviewed  BASIC METABOLIC PANEL WITH GFR - Abnormal; Notable for the following components:      Result Value   Glucose, Bld 111 (*)    Calcium 8.6 (*)    All other components within normal limits  CBC - Abnormal; Notable for the following components:   WBC 11.9 (*)    All other components within normal limits  D-DIMER, QUANTITATIVE - Abnormal; Notable for the following components:   D-Dimer, Quant 0.97 (*)    All other components within normal limits  PROTIME-INR  TROPONIN I (HIGH SENSITIVITY)    EKG EKG Interpretation Date/Time:  Wednesday October 28 2023 12:20:36 EDT Ventricular Rate:  75 PR Interval:  168 QRS Duration:  86 QT Interval:  390 QTC Calculation: 435 R Axis:   -35  Text Interpretation: Sinus rhythm Left axis deviation Abnormal ECG When compared with ECG of 27-Mar-2023 08:46, Fusion complexes are now Present Confirmed by Virgina Norfolk [phone]) on 10/28/2023 12:39:41 PM  Radiology No results found.  Procedures Procedures    Medications Ordered in ED Medications  iohexol (OMNIPAQUE) 350 MG/ML injection 100 mL (80 mLs Intravenous Contrast Given 10/28/23 1357)    ED Course/ Medical Decision Making/ A&P                                 Medical Decision Making Amount and/or Complexity of Data Reviewed Labs: ordered. Radiology:  ordered.  Risk Prescription drug management.   Dustin Wood is here with chest pain cough shortness of breath left flank pain/rib pain.  He has had allergy type symptoms and cough symptoms here the last week or so.  Had negative flu test at primary care doctor's office.  Has a history of CAD.  Differential diagnosis could be pneumonia post viral process reactive airway process, bronchitis, PE, less likely ACS.  Will check CBC BMP troponin D-dimer chest x-ray.  EKG shows sinus rhythm.  No ischemic changes.  D-dimer elevated will get CT scan of his chest.  He has mild leukocytosis of 11.9 but no significant anemia or electrolyte abnormality.  Troponin normal.  Chest x-ray looks like he has a left lower lobe pneumonia but will confirm this with CT scan.  He is well-appearing otherwise.  I think he can likely be discharged with outpatient antibiotics and close follow-up.  Handed off to oncoming ED staff with patient pending CT read.  This chart was dictated using voice recognition software.  Despite best efforts to proofread,  errors can occur which can change the documentation meaning.         Final Clinical Impression(s) / ED Diagnoses Final diagnoses:  SOB (shortness of breath)  Community acquired pneumonia, unspecified laterality    Rx / DC Orders ED Discharge Orders     None         Virgina Norfolk, DO 10/28/23 1443

## 2023-10-28 NOTE — ED Triage Notes (Signed)
 Pt via pov from home with left ribcage pain x 1 week. The pain has moved to his mid back area today. Pt states he was feeling unwell and went to pcp a few days ago and was told he had high wbc count; states he tested negative for flu; cough continued, as well as fatigue and ribcage pain. Pt had CABG in 2023. A&O x 4; nad noted.

## 2023-10-29 ENCOUNTER — Other Ambulatory Visit: Payer: Self-pay | Admitting: Family Medicine

## 2023-10-29 ENCOUNTER — Telehealth: Payer: Self-pay

## 2023-10-29 MED ORDER — LEVOFLOXACIN 250 MG PO TABS: 750.0000 mg | ORAL_TABLET | Freq: Every day | ORAL | 0 refills | Status: DC

## 2023-10-29 NOTE — Telephone Encounter (Signed)
 Copied from CRM [id number]. Topic: Clinical - Prescription Issue >> Oct 29, 2023  2:23 PM Franchot Heidelberg wrote: Reason for CRM: Pt called following up on request to have Levofloxacin transferred to an alternate pharmacy since the [facility] where it currently is does not have it in stock. Advised pt that provider is seeing patients and will not be able to address this until after 4.   Wants to speak to [facility] contact: [phone]

## 2023-11-06 ENCOUNTER — Encounter: Payer: Self-pay | Admitting: Family Medicine

## 2023-11-06 ENCOUNTER — Ambulatory Visit: Admitting: Family Medicine

## 2023-11-06 VITALS — BP 120/64 | HR 77 | Temp 98.1°F | Ht 69.0 in | Wt 174.0 lb

## 2023-11-06 DIAGNOSIS — J189 Pneumonia, unspecified organism: Secondary | ICD-10-CM | POA: Diagnosis not present

## 2023-11-06 NOTE — Progress Notes (Signed)
 Subjective:    Patient ID: Dustin Wood, male    DOB: 1955-12-13, 68 y.o.   MRN: [id number]  Patient recently developed left-sided chest pain with cough fever.  White blood cell count was greater than 20.  Went to the emergency room and was diagnosed with left lower lobe pneumonia on the CT scan.  Was treated with 7 days of Levaquin  750 mg daily the patient is feeling much better.  White blood cell count in the emergency room was 11.9.  D-dimer was elevated prompting a CT scan which showed left-sided pneumonia.  Patient denies any further cough.  He denies fevers or chills. 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  Normal RCA and LCx.  Normal EF and EDP. => Due to location and bifurcation, referred for CABG.   SCC (squamous cell carcinoma) 06/30/2016   RIGHT DORSAL HAND CX3 5FU   SCC (squamous cell carcinoma) 06/30/2016   LEFT HAND TX CX3 5FU   Squamous cell carcinoma of skin 12/2015   RIGHT TEMPLE CX3 5FU    Denies surgeries  Allergies  Allergen Reactions   Penicillins Other (See Comments)    Childhood allergy   Rosuvastatin  Other (See Comments)    myalgias   Social History   Socioeconomic History   Marital status: Married    Spouse name: Not on file   Number of children: Not on file   Years of education: 16   Highest education level: Bachelor's degree (e.g., BA, AB, BS)  Occupational History   Not on file  Tobacco Use   Smoking status: Never   Smokeless tobacco: Never  Vaping Use   Vaping status: Never Used  Substance and Sexual Activity   Alcohol use: Yes    Comment: rare   Drug use: No   Sexual activity: Not on file  Other Topics  Concern   Not on file  Social History Narrative   Not on file   Social Drivers of Health   Financial Resource Strain: Patient Declined (11/04/2023)   Overall Financial Resource Strain (CARDIA)    Difficulty of Paying Living Expenses: Patient declined  Food Insecurity: Patient Declined (11/04/2023)   Hunger Vital Sign    Worried About Running Out of Food in the Last Year: Patient declined    Ran Out of Food in the Last Year: Patient declined  Transportation Needs: Patient Declined (11/04/2023)   PRAPARE - Administrator, Civil Service (Medical): Patient declined    Lack of Transportation (Non-Medical): Patient declined  Physical Activity: Unknown (11/04/2023)   Exercise Vital Sign    Days of Exercise per Week: Patient declined    Minutes of Exercise per Session: Not on file  Stress: Patient Declined (11/04/2023)   [employer] of Occupational Health - Occupational Stress Questionnaire    Feeling of Stress : Patient declined  Social Connections: Unknown (11/04/2023)   Social Connection and Isolation Panel [NHANES]    Frequency of Communication with Friends and Family: Patient declined    Frequency of Social Gatherings with Friends and Family: Patient declined    Attends Religious Services: Patient declined    Active Member of Clubs or Organizations: Patient declined  Attends Banker Meetings: Not on file    Marital Status: Married  Intimate Partner Violence: Not At Risk (04/14/2022)   Humiliation, Afraid, Rape, and Kick questionnaire    Fear of Current or Ex-Partner: No    Emotionally Abused: No    Physically Abused: No    Sexually Abused: No      Review of Systems  All other systems reviewed and are negative.      Objective:   Physical Exam Vitals reviewed.  Constitutional:      General: He is not in acute distress.    Appearance: He is well-developed. He is not diaphoretic.  HENT:     Head: Normocephalic and atraumatic.     Right Ear:  Tympanic membrane is not erythematous.     Left Ear: Tympanic membrane is not erythematous.     Nose: No congestion or rhinorrhea.     Mouth/Throat:     Mouth: Mucous membranes are moist. No oral lesions.     Pharynx: No oropharyngeal exudate or posterior oropharyngeal erythema.     Tonsils: No tonsillar exudate.  Neck:     Thyroid: No thyromegaly.     Vascular: No JVD.     Trachea: No tracheal deviation.  Cardiovascular:     Rate and Rhythm: Normal rate and regular rhythm.     Heart sounds: Normal heart sounds. No murmur heard.    No friction rub. No gallop.  Pulmonary:     Effort: Pulmonary effort is normal. No respiratory distress.     Breath sounds: Normal breath sounds. No stridor. No wheezing or rales.  Chest:     Chest wall: No tenderness.  Abdominal:     General: Abdomen is flat.  Musculoskeletal:     Cervical back: Normal range of motion and neck supple.  Lymphadenopathy:     Cervical: No cervical adenopathy.  Skin:    General: Skin is warm.     Coloration: Skin is not pale.     Findings: No erythema or rash.  Neurological:     Mental Status: He is alert.     Motor: No abnormal muscle tone.     Deep Tendon Reflexes: Reflexes are normal and symmetric.  Psychiatric:        Thought Content: Thought content normal.        Judgment: Judgment normal.           Assessment & Plan:  Pneumonia of left lower lobe due to infectious organism - Plan: CBC with Differential/Platelet, COMPLETE METABOLIC PANEL WITHOUT GFR Lungs are clear to auscultation bilaterally today.  Repeat CBC to ensure resolution of leukocytosis.  If leukocytosis persist I would recommend repeat imaging of the lung to ensure resolution.  Recommended a pneumonia vaccine which patient declined

## 2023-11-07 LAB — COMPLETE METABOLIC PANEL WITHOUT GFR
AG Ratio: 1.4 (calc) (ref 1.0–2.5)
ALT: 20 U/L (ref 9–46)
AST: 13 U/L (ref 10–35)
Albumin: 3.8 g/dL (ref 3.6–5.1)
Alkaline phosphatase (APISO): 54 U/L (ref 35–144)
BUN: 17 mg/dL (ref 7–25)
CO2: 25 mmol/L (ref 20–32)
Calcium: 9 mg/dL (ref 8.6–10.3)
Chloride: 105 mmol/L (ref 98–110)
Creat: 0.97 mg/dL (ref 0.70–1.35)
Globulin: 2.7 g/dL (ref 1.9–3.7)
Glucose, Bld: 115 mg/dL — ABNORMAL HIGH (ref 65–99)
Potassium: 4.1 mmol/L (ref 3.5–5.3)
Sodium: 138 mmol/L (ref 135–146)
Total Bilirubin: 0.4 mg/dL (ref 0.2–1.2)
Total Protein: 6.5 g/dL (ref 6.1–8.1)

## 2023-11-07 LAB — CBC WITH DIFFERENTIAL/PLATELET
Absolute Lymphocytes: 2218 {cells}/uL (ref 850–3900)
Absolute Monocytes: 590 {cells}/uL (ref 200–950)
Basophils Absolute: 53 {cells}/uL (ref 0–200)
Basophils Relative: 0.6 %
Eosinophils Absolute: 308 {cells}/uL (ref 15–500)
Eosinophils Relative: 3.5 %
HCT: 44.4 % (ref 38.5–50.0)
Hemoglobin: 14.9 g/dL (ref 13.2–17.1)
MCH: 28.3 pg (ref 27.0–33.0)
MCHC: 33.6 g/dL (ref 32.0–36.0)
MCV: 84.4 fL (ref 80.0–100.0)
MPV: 9.1 fL (ref 7.5–12.5)
Monocytes Relative: 6.7 %
Neutro Abs: 5632 {cells}/uL (ref 1500–7800)
Neutrophils Relative %: 64 %
Platelets: 410 10*3/uL — ABNORMAL HIGH (ref 140–400)
RBC: 5.26 10*6/uL (ref 4.20–5.80)
RDW: 13 % (ref 11.0–15.0)
Total Lymphocyte: 25.2 %
WBC: 8.8 10*3/uL (ref 3.8–10.8)

## 2023-12-06 ENCOUNTER — Encounter: Payer: Self-pay | Admitting: Family Medicine

## 2023-12-08 ENCOUNTER — Other Ambulatory Visit: Payer: Self-pay | Admitting: Family Medicine

## 2023-12-08 MED ORDER — TERBINAFINE HCL 250 MG PO TABS: 250.0000 mg | ORAL_TABLET | Freq: Every day | ORAL | 2 refills | Status: AC

## 2024-01-28 ENCOUNTER — Encounter: Payer: Self-pay | Admitting: Cardiology

## 2024-02-05 ENCOUNTER — Ambulatory Visit: Admitting: Family Medicine

## 2024-02-05 ENCOUNTER — Encounter: Payer: Self-pay | Admitting: Family Medicine

## 2024-02-05 VITALS — BP 116/68 | HR 75 | Ht 69.0 in | Wt 179.6 lb

## 2024-02-05 DIAGNOSIS — L03032 Cellulitis of left toe: Secondary | ICD-10-CM | POA: Diagnosis not present

## 2024-02-05 MED ORDER — SULFAMETHOXAZOLE-TRIMETHOPRIM 800-160 MG PO TABS: 1.0000 | ORAL_TABLET | Freq: Two times a day (BID) | ORAL | 0 refills | Status: AC

## 2024-02-05 NOTE — Progress Notes (Signed)
 Subjective:    Patient ID: Dustin Wood, male    DOB: [birth year], 68 y.o.   MRN: [id number]  Patient reports pain in his left great toe.  The skin around the nail is erythematous warm and swollen.  It is especially warm swollen and tender adjacent to the lunula.  There is no visible paronychia.  There is no purulent collection that needs to be drained today.  Patient states the pain started 3 weeks ago and he has been trying to treat it at home.  He states that some pus drained a few days ago.  However he has not seen any purulent drainage since.  The nail is opaque and yellow suggesting onychomycosis.  However it is firmly attached to the nailbed. 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  Normal RCA and LCx.  Normal EF and EDP. => Due to location and bifurcation, referred for CABG.   SCC (squamous cell carcinoma) 06/30/2016   RIGHT DORSAL HAND CX3 5FU   SCC (squamous cell carcinoma) 06/30/2016   LEFT HAND TX CX3 5FU   Squamous cell carcinoma of skin 12/2015   RIGHT TEMPLE CX3 5FU    Denies surgeries  Allergies  Allergen Reactions   Penicillins Other (See Comments)    Childhood allergy   Rosuvastatin  Other (See Comments)    myalgias   Social History   Socioeconomic History   Marital status: Married    Spouse name: Not on file   Number of children: Not on file   Years of education: 16   Highest education level: Bachelor's degree (e.g., BA, AB, BS)  Occupational History   Not on file  Tobacco Use   Smoking status: Never   Smokeless tobacco: Never  Vaping Use   Vaping status: Never Used  Substance and Sexual Activity   Alcohol use: Yes     Comment: rare   Drug use: No   Sexual activity: Not on file  Other Topics Concern   Not on file  Social History Narrative   Not on file   Social Drivers of Health   Financial Resource Strain: Patient Declined (11/04/2023)   Overall Financial Resource Strain (CARDIA)    Difficulty of Paying Living Expenses: Patient declined  Food Insecurity: Patient Declined (11/04/2023)   Hunger Vital Sign    Worried About Running Out of Food in the Last Year: Patient declined    Ran Out of Food in the Last Year: Patient declined  Transportation Needs: Patient Declined (11/04/2023)   PRAPARE - Administrator, Civil Service (Medical): Patient declined    Lack of Transportation (Non-Medical): Patient declined  Physical Activity: Unknown (11/04/2023)   Exercise Vital Sign    Days of Exercise per Week: Patient declined    Minutes of Exercise per Session: Not on file  Stress: Patient Declined (11/04/2023)   [employer] of Occupational Health - Occupational Stress Questionnaire    Feeling of Stress : Patient declined  Social Connections: Unknown (11/04/2023)   Social Connection and Isolation Panel    Frequency of Communication with Friends and Family: Patient declined    Frequency of Social Gatherings with Friends and Family: Patient declined  Attends Religious Services: Patient declined    Active Member of Clubs or Organizations: Patient declined    Attends Banker Meetings: Not on file    Marital Status: Married  Intimate Partner Violence: Not At Risk (04/14/2022)   Humiliation, Afraid, Rape, and Kick questionnaire    Fear of Current or Ex-Partner: No    Emotionally Abused: No    Physically Abused: No    Sexually Abused: No      Review of Systems  All other systems reviewed and are negative.      Objective:   Physical Exam Vitals reviewed.  Constitutional:      General: He is not in acute distress.    Appearance: He is well-developed. He is not diaphoretic.   HENT:     Head: Normocephalic and atraumatic.     Right Ear: Tympanic membrane is not erythematous.     Left Ear: Tympanic membrane is not erythematous.     Mouth/Throat:     Mouth: No oral lesions.     Tonsils: No tonsillar exudate.  Neck:     Vascular: No JVD.     Trachea: No tracheal deviation.  Cardiovascular:     Rate and Rhythm: Normal rate and regular rhythm.     Heart sounds: Normal heart sounds. No murmur heard.    No friction rub. No gallop.  Pulmonary:     Effort: Pulmonary effort is normal. No respiratory distress.     Breath sounds: Normal breath sounds. No stridor. No wheezing or rales.  Chest:     Chest wall: No tenderness.  Musculoskeletal:       Feet:  Skin:    General: Skin is warm.     Coloration: Skin is not pale.     Findings: No erythema or rash.  Neurological:     Mental Status: He is alert.     Motor: No abnormal muscle tone.     Deep Tendon Reflexes: Reflexes are normal and symmetric.  Psychiatric:        Thought Content: Thought content normal.        Judgment: Judgment normal.    Patient has erythema swelling tenderness especially in the skin proximal to the lunula       Assessment & Plan:  Cellulitis of toe of left foot Patient has cellulitis.  There is no visible evidence of a paronychia today.  Given the fact symptoms have been present for 3 weeks I am concerned about possible osteomyelitis.  Start the patient on Bactrim double strength tablets twice daily for 10 days and follow-up next week.  I did perform cryotherapy for 30 seconds on a erythematous papular lesion on his left forearm that was approximately 5 mm in diameter

## 2024-02-11 ENCOUNTER — Ambulatory Visit: Payer: Self-pay

## 2024-02-11 NOTE — Telephone Encounter (Signed)
 FYI Only or Action Required?: FYI only for provider.  Patient was last seen in primary care on 02/05/2024 by Dustin Butler DASEN, MD.  Called Nurse Triage reporting Toe Pain.  Symptoms began several days ago.  Interventions attempted: Prescription medications: Bactrim  and Rest, hydration, or home remedies.  Symptoms are: unchanged.  Triage Disposition: See Physician Within 24 Hours  Patient/caregiver understands and will follow disposition?: Yes  Copied from CRM #[id number]. Topic: Clinical - Red Word Triage >> Feb 11, 2024  3:03 PM Deleta RAMAN wrote: Red Word that prompted transfer to Nurse Triage: patient has toe infection experiencing pain 3 will be his pain level on a scale of 1-10 Reason for Disposition  [1] Swollen toe AND [2] no fever  (Exceptions: Just a localized bump from bunion, corns, insect bite, sting.)  Answer Assessment - Initial Assessment Questions 1. ONSET: When did the pain start?      Started this morning 2. LOCATION: Where is the pain located?   (e.g., around nail, entire toe, at foot joint)      Left big toe 3. PAIN: How bad is the pain?    (Scale 1-10; or mild, moderate, severe)     3-4 out of 10 4. APPEARANCE: What does the toe look like? (e.g., redness, swelling, bruising, pallor)     Redness and swelling 5. CAUSE: What do you think is causing the toe pain?     Dx with toe infection last week 6. OTHER SYMPTOMS: Do you have any other symptoms? (e.g., leg pain, rash, fever, numbness)     No other symptoms  Has about four days left on the antibiotics that he was given at previous appointment.  Protocols used: Toe Pain-A-AH

## 2024-02-12 ENCOUNTER — Ambulatory Visit (INDEPENDENT_AMBULATORY_CARE_PROVIDER_SITE_OTHER): Admitting: Family Medicine

## 2024-02-12 ENCOUNTER — Encounter: Payer: Self-pay | Admitting: Family Medicine

## 2024-02-12 VITALS — BP 112/62 | HR 67 | Temp 98.0°F | Ht 69.0 in | Wt 179.0 lb

## 2024-02-12 DIAGNOSIS — L03032 Cellulitis of left toe: Secondary | ICD-10-CM

## 2024-02-12 MED ORDER — HYDROCODONE-ACETAMINOPHEN 5-325 MG PO TABS: 1.0000 | ORAL_TABLET | Freq: Four times a day (QID) | ORAL | 0 refills | Status: AC | PRN

## 2024-02-12 MED ORDER — SULFAMETHOXAZOLE-TRIMETHOPRIM 800-160 MG PO TABS: 1.0000 | ORAL_TABLET | Freq: Two times a day (BID) | ORAL | 0 refills | Status: AC

## 2024-02-12 NOTE — Progress Notes (Signed)
 Subjective:    Patient ID: Dustin Wood, male    DOB: 1955/12/10, 68 y.o.   MRN: [id number] 02/05/24 Patient reports pain in his left great toe.  The skin around the nail is erythematous warm and swollen.  It is especially warm swollen and tender adjacent to the lunula.  There is no visible paronychia.  There is no purulent collection that needs to be drained today.  Patient states the pain started 3 weeks ago and he has been trying to treat it at home.  He states that some pus drained a few days ago.  However he has not seen any purulent drainage since.  The nail is opaque and yellow suggesting onychomycosis.  However it is firmly attached to the nailbed.  At that time, my plan was: Patient has cellulitis.  There is no visible evidence of a paronychia today.  Given the fact symptoms have been present for 3 weeks I am concerned about possible osteomyelitis.  Start the patient on Bactrim  double strength tablets twice daily for 10 days and follow-up next week.  I did perform cryotherapy for 30 seconds on a erythematous papular lesion on his left forearm that was approximately 5 mm in diameter  02/12/24 Patient states that the toe felt much better over the last week.  The redness has started to fade.  However starting 48 hours ago, the pain returned.  He was having tenderness and swelling and erythema in the skin adjacent to the toenail.  Under the toenail, there appears to be a collection of pus near the lunula.  Patient denies any fevers or chills.  I did take a photograph today that shows erythema extending all the way around the skin adjacent to the toenail.  It also shows the toenail yellow with what appears to be pus under the proximal toenail and swelling in the skin adjacent to the proximal toenail.  However, I am unable to transfer the photograph into his chart.  This is available in my personal records and I will try to download this later. 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  Normal RCA and LCx.  Normal EF and EDP. => Due to location and bifurcation, referred for CABG.   SCC (squamous cell carcinoma) 06/30/2016   RIGHT DORSAL HAND CX3 5FU   SCC (squamous cell carcinoma) 06/30/2016   LEFT HAND TX CX3 5FU   Squamous cell carcinoma of skin 12/2015   RIGHT TEMPLE CX3 5FU    Denies surgeries  Allergies  Allergen Reactions   Penicillins Other (See Comments)    Childhood allergy   Rosuvastatin  Other (See Comments)    myalgias   Social History   Socioeconomic History   Marital status: Married    Spouse name: Not on file   Number of children: Not on file   Years of education: 16   Highest education level: Bachelor's degree (e.g., BA, AB, BS)  Occupational History   Not on file  Tobacco Use   Smoking status: Never   Smokeless tobacco: Never  Vaping Use   Vaping status: Never Used  Substance and Sexual Activity   Alcohol use: Yes    Comment: rare  Drug use: No   Sexual activity: Not on file  Other Topics Concern   Not on file  Social History Narrative   Not on file   Social Drivers of Health   Financial Resource Strain: Patient Declined (11/04/2023)   Overall Financial Resource Strain (CARDIA)    Difficulty of Paying Living Expenses: Patient declined  Food Insecurity: Patient Declined (11/04/2023)   Hunger Vital Sign    Worried About Running Out of Food in the Last Year: Patient declined    Ran Out of Food in the Last Year: Patient declined  Transportation Needs: Patient Declined (11/04/2023)   PRAPARE - Administrator, Civil Service (Medical): Patient declined    Lack of Transportation (Non-Medical): Patient declined   Physical Activity: Unknown (11/04/2023)   Exercise Vital Sign    Days of Exercise per Week: Patient declined    Minutes of Exercise per Session: Not on file  Stress: Patient Declined (11/04/2023)   [employer] of Occupational Health - Occupational Stress Questionnaire    Feeling of Stress : Patient declined  Social Connections: Unknown (11/04/2023)   Social Connection and Isolation Panel    Frequency of Communication with Friends and Family: Patient declined    Frequency of Social Gatherings with Friends and Family: Patient declined    Attends Religious Services: Patient declined    Database administrator or Organizations: Patient declined    Attends Banker Meetings: Not on file    Marital Status: Married  Intimate Partner Violence: Not At Risk (04/14/2022)   Humiliation, Afraid, Rape, and Kick questionnaire    Fear of Current or Ex-Partner: No    Emotionally Abused: No    Physically Abused: No    Sexually Abused: No      Review of Systems  All other systems reviewed and are negative.      Objective:   Physical Exam Vitals reviewed.  Constitutional:      General: He is not in acute distress.    Appearance: He is well-developed. He is not diaphoretic.  HENT:     Head: Normocephalic and atraumatic.     Right Ear: Tympanic membrane is not erythematous.     Left Ear: Tympanic membrane is not erythematous.     Mouth/Throat:     Mouth: No oral lesions.     Tonsils: No tonsillar exudate.  Neck:     Vascular: No JVD.     Trachea: No tracheal deviation.  Cardiovascular:     Rate and Rhythm: Normal rate and regular rhythm.     Heart sounds: Normal heart sounds. No murmur heard.    No friction rub. No gallop.  Pulmonary:     Effort: Pulmonary effort is normal. No respiratory distress.     Breath sounds: Normal breath sounds. No stridor. No wheezing or rales.  Chest:     Chest wall: No tenderness.  Musculoskeletal:       Feet:  Skin:    General: Skin  is warm.     Coloration: Skin is not pale.     Findings: No erythema or rash.  Neurological:     Mental Status: He is alert.     Motor: No abnormal muscle tone.     Deep Tendon Reflexes: Reflexes are normal and symmetric.  Psychiatric:        Thought Content: Thought content normal.        Judgment: Judgment normal.    Patient has erythema swelling tenderness especially in the  skin proximal to the lunula       Assessment & Plan:  Cellulitis of toe of left foot  Paronychia of great toe, left I anesthetized the toe with 0.1% lidocaine  without epinephrine .  I then prepped and draped the toe in sterile fashion with Betadine and applied a tourniquet to the base of the toe.  I separated the toenail from the underlying nailbed with a metal elevator.  After separating the toenail, a collection of pus squirted out from under the proximal toenail.  I removed the toenail in its entirety with traction and a pair of hemostats.  I then used swab soaked in peroxide to clean the nailbed thoroughly along with the nail matrix.  I then applied copious amounts of Neosporin to the nailbed, wrapped the toe in petroleum gauze and then wrapped the toe and Coban.  Patient lost approximately 3 to 4 cc of blood.  The tourniquet was removed.  Wound care was discussed.  If toe is not improving by next week, I am concerned about possible osteomyelitis and would recommend an MRI of the toe to evaluate further.  Patient will continue Bactrim  double strength tablets twice daily for 10 days and I did give the patient a prescription for Norco for pain relief as he is unable to take NSAIDs because of Plavix 

## 2024-02-16 ENCOUNTER — Encounter: Payer: Self-pay | Admitting: Family Medicine

## 2024-02-25 ENCOUNTER — Telehealth: Payer: Self-pay | Admitting: Family Medicine

## 2024-02-25 NOTE — Telephone Encounter (Signed)
 Prescription Request  02/25/2024  LOV: 02/12/2024  What is the name of the medication or equipment?   glucose blood test strip   Have you contacted your pharmacy to request a refill? Yes   Which pharmacy would you like this sent to?  [id number] - [place], [place] - [address] [place] [address] Phone: [phone] Fax: [phone]    Patient notified that their request is being sent to the clinical staff for review and that they should receive a response within 2 business days.   Please advise pharmacist.

## 2024-02-26 ENCOUNTER — Other Ambulatory Visit: Payer: Self-pay

## 2024-02-26 DIAGNOSIS — E1122 Type 2 diabetes mellitus with diabetic chronic kidney disease: Secondary | ICD-10-CM

## 2024-02-26 MED ORDER — GLUCOSE BLOOD VI STRP: ORAL_STRIP | 12 refills | Status: AC

## 2024-03-04 ENCOUNTER — Other Ambulatory Visit: Payer: Self-pay | Admitting: Cardiology

## 2024-04-07 ENCOUNTER — Encounter: Payer: Self-pay | Admitting: Family Medicine

## 2024-05-18 ENCOUNTER — Encounter: Payer: Self-pay | Admitting: Family Medicine

## 2024-05-22 ENCOUNTER — Other Ambulatory Visit: Payer: Self-pay | Admitting: Family Medicine

## 2024-05-22 DIAGNOSIS — E119 Type 2 diabetes mellitus without complications: Secondary | ICD-10-CM

## 2024-05-23 ENCOUNTER — Other Ambulatory Visit: Payer: Self-pay | Admitting: Family Medicine

## 2024-05-23 DIAGNOSIS — E119 Type 2 diabetes mellitus without complications: Secondary | ICD-10-CM

## 2024-05-25 ENCOUNTER — Other Ambulatory Visit

## 2024-05-25 DIAGNOSIS — R739 Hyperglycemia, unspecified: Secondary | ICD-10-CM

## 2024-05-25 DIAGNOSIS — I2510 Atherosclerotic heart disease of native coronary artery without angina pectoris: Secondary | ICD-10-CM

## 2024-05-25 DIAGNOSIS — Z125 Encounter for screening for malignant neoplasm of prostate: Secondary | ICD-10-CM

## 2024-05-25 DIAGNOSIS — E781 Pure hyperglyceridemia: Secondary | ICD-10-CM

## 2024-05-25 DIAGNOSIS — I214 Non-ST elevation (NSTEMI) myocardial infarction: Secondary | ICD-10-CM

## 2024-05-25 DIAGNOSIS — Z951 Presence of aortocoronary bypass graft: Secondary | ICD-10-CM

## 2024-05-25 DIAGNOSIS — E1169 Type 2 diabetes mellitus with other specified complication: Secondary | ICD-10-CM

## 2024-05-25 DIAGNOSIS — E119 Type 2 diabetes mellitus without complications: Secondary | ICD-10-CM

## 2024-05-26 ENCOUNTER — Ambulatory Visit: Payer: Self-pay | Admitting: Family Medicine

## 2024-05-27 LAB — PSA: PSA: 0.96 ng/mL (ref <=4.00)

## 2024-05-27 LAB — LIPID PANEL
Cholesterol: 135 mg/dL (ref <200)
HDL: 41 mg/dL (ref >=40)
LDL Cholesterol (Calc): 74 mg/dL
Non-HDL Cholesterol (Calc): 94 mg/dL (ref <130)
Total CHOL/HDL Ratio: 3.3 (calc) (ref <5.0)
Triglycerides: 117 mg/dL (ref <150)

## 2024-05-27 LAB — COMPLETE METABOLIC PANEL WITHOUT GFR
AG Ratio: 1.6 (calc) (ref 1.0–2.5)
ALT: 29 U/L (ref 9–46)
AST: 21 U/L (ref 10–35)
Albumin: 4.2 g/dL (ref 3.6–5.1)
Alkaline phosphatase (APISO): 50 U/L (ref 35–144)
BUN: 18 mg/dL (ref 7–25)
CO2: 25 mmol/L (ref 20–32)
Calcium: 9.2 mg/dL (ref 8.6–10.3)
Chloride: 106 mmol/L (ref 98–110)
Creat: 1.03 mg/dL (ref 0.70–1.35)
Globulin: 2.6 g/dL (ref 1.9–3.7)
Glucose, Bld: 102 mg/dL — ABNORMAL HIGH (ref 65–99)
Potassium: 4.8 mmol/L (ref 3.5–5.3)
Sodium: 139 mmol/L (ref 135–146)
Total Bilirubin: 0.3 mg/dL (ref 0.2–1.2)
Total Protein: 6.8 g/dL (ref 6.1–8.1)

## 2024-05-27 LAB — HEMOGLOBIN A1C
Hgb A1c MFr Bld: 6.2 % — ABNORMAL HIGH (ref <5.7)
Mean Plasma Glucose: 131 mg/dL
eAG (mmol/L): 7.3 mmol/L

## 2024-05-27 LAB — CBC WITH DIFFERENTIAL/PLATELET
Absolute Lymphocytes: 2166 {cells}/uL (ref 850–3900)
Absolute Monocytes: 683 {cells}/uL (ref 200–950)
Basophils Absolute: 36 {cells}/uL (ref 0–200)
Basophils Relative: 0.4 %
Eosinophils Absolute: 255 {cells}/uL (ref 15–500)
Eosinophils Relative: 2.8 %
HCT: 50.3 % — ABNORMAL HIGH (ref 38.5–50.0)
Hemoglobin: 16.4 g/dL (ref 13.2–17.1)
MCH: 28.5 pg (ref 27.0–33.0)
MCHC: 32.6 g/dL (ref 32.0–36.0)
MCV: 87.5 fL (ref 80.0–100.0)
MPV: 9.3 fL (ref 7.5–12.5)
Monocytes Relative: 7.5 %
Neutro Abs: 5961 {cells}/uL (ref 1500–7800)
Neutrophils Relative %: 65.5 %
Platelets: 251 Thousand/uL (ref 140–400)
RBC: 5.75 Million/uL (ref 4.20–5.80)
RDW: 13.2 % (ref 11.0–15.0)
Total Lymphocyte: 23.8 %
WBC: 9.1 Thousand/uL (ref 3.8–10.8)

## 2024-05-27 LAB — VITAMIN B12: Vitamin B-12: 724 pg/mL (ref 200–1100)

## 2024-05-27 LAB — MICROALBUMIN / CREATININE URINE RATIO
Creatinine, Urine: 98 mg/dL (ref 20–320)
Microalb Creat Ratio: 6 mg/g{creat} (ref <30)
Microalb, Ur: 0.6 mg/dL

## 2024-05-31 ENCOUNTER — Other Ambulatory Visit: Payer: Self-pay

## 2024-05-31 MED ORDER — METOPROLOL TARTRATE 25 MG PO TABS: 12.5000 mg | ORAL_TABLET | Freq: Two times a day (BID) | ORAL | 3 refills | Status: AC

## 2024-05-31 MED ORDER — CLOPIDOGREL BISULFATE 75 MG PO TABS
75.0000 mg | ORAL_TABLET | Freq: Every day | ORAL | 3 refills | Status: AC
Start: 2024-05-31 — End: ?

## 2024-05-31 NOTE — Telephone Encounter (Signed)
 Refill sent to pharmacy.

## 2024-06-27 MED ORDER — AMLODIPINE BESYLATE 5 MG PO TABS: 2.5000 mg | ORAL_TABLET | Freq: Every day | ORAL | 1 refills | Status: DC

## 2024-06-27 NOTE — Addendum Note (Signed)
 Addended by: DRENA MARTINIS, Rossana Molchan L on: 06/27/2024 01:23 PM   Modules accepted: Orders

## 2024-07-01 ENCOUNTER — Encounter: Payer: Self-pay | Admitting: Cardiology

## 2024-08-16 MED ORDER — AMLODIPINE BESYLATE 5 MG PO TABS: 5.0000 mg | ORAL_TABLET | Freq: Every day | ORAL | 0 refills | Status: AC

## 2024-09-28 ENCOUNTER — Ambulatory Visit: Admitting: Cardiology
